# Patient Record
Sex: Female | Born: 1935 | Race: White | Hispanic: No | Marital: Married | Smoking: Former smoker
Health system: Southern US, Community
[De-identification: ages and names within clinical notes are randomized; demographics above are authoritative.]

## PROBLEM LIST (undated history)

## (undated) DIAGNOSIS — N183 Chronic kidney disease, stage 3 unspecified: Secondary | ICD-10-CM

## (undated) DIAGNOSIS — I495 Sick sinus syndrome: Secondary | ICD-10-CM

## (undated) DIAGNOSIS — I10 Essential (primary) hypertension: Secondary | ICD-10-CM

## (undated) DIAGNOSIS — E785 Hyperlipidemia, unspecified: Secondary | ICD-10-CM

## (undated) DIAGNOSIS — I5042 Chronic combined systolic (congestive) and diastolic (congestive) heart failure: Secondary | ICD-10-CM

## (undated) DIAGNOSIS — E119 Type 2 diabetes mellitus without complications: Secondary | ICD-10-CM

## (undated) DIAGNOSIS — I480 Paroxysmal atrial fibrillation: Secondary | ICD-10-CM

## (undated) HISTORY — DX: Essential (primary) hypertension: I10

## (undated) HISTORY — DX: Type 2 diabetes mellitus without complications: E11.9

## (undated) HISTORY — PX: CATARACT EXTRACTION: SUR2

## (undated) HISTORY — PX: EYE SURGERY: SHX253

## (undated) HISTORY — DX: Chronic kidney disease, stage 3 (moderate): N18.3

## (undated) HISTORY — DX: Chronic kidney disease, stage 3 unspecified: N18.30

## (undated) HISTORY — DX: Hyperlipidemia, unspecified: E78.5

## (undated) HISTORY — DX: Chronic combined systolic (congestive) and diastolic (congestive) heart failure: I50.42

## (undated) HISTORY — DX: Sick sinus syndrome: I49.5

## (undated) HISTORY — DX: Paroxysmal atrial fibrillation: I48.0

---

## 2003-05-10 ENCOUNTER — Encounter: Admission: RE | Admit: 2003-05-10 | Discharge: 2003-08-08 | Payer: Self-pay | Admitting: Emergency Medicine

## 2011-11-03 ENCOUNTER — Ambulatory Visit (INDEPENDENT_AMBULATORY_CARE_PROVIDER_SITE_OTHER): Payer: Medicare Other | Admitting: Family Medicine

## 2011-11-03 DIAGNOSIS — E119 Type 2 diabetes mellitus without complications: Secondary | ICD-10-CM

## 2011-11-03 DIAGNOSIS — I10 Essential (primary) hypertension: Secondary | ICD-10-CM

## 2012-02-16 ENCOUNTER — Encounter: Payer: Self-pay | Admitting: Family Medicine

## 2012-02-16 ENCOUNTER — Ambulatory Visit (INDEPENDENT_AMBULATORY_CARE_PROVIDER_SITE_OTHER): Payer: Medicare Other | Admitting: Family Medicine

## 2012-02-16 VITALS — BP 165/71 | HR 66 | Temp 97.8°F | Resp 16 | Ht 67.5 in | Wt 149.0 lb

## 2012-02-16 DIAGNOSIS — E785 Hyperlipidemia, unspecified: Secondary | ICD-10-CM

## 2012-02-16 DIAGNOSIS — E119 Type 2 diabetes mellitus without complications: Secondary | ICD-10-CM

## 2012-02-16 DIAGNOSIS — I10 Essential (primary) hypertension: Secondary | ICD-10-CM | POA: Insufficient documentation

## 2012-02-16 LAB — COMPREHENSIVE METABOLIC PANEL
ALT: 10 U/L (ref 0–35)
AST: 14 U/L (ref 0–37)
BUN: 23 mg/dL (ref 6–23)
Calcium: 10 mg/dL (ref 8.4–10.5)
Creat: 1.09 mg/dL (ref 0.50–1.10)
Total Bilirubin: 0.8 mg/dL (ref 0.3–1.2)

## 2012-02-16 LAB — LIPID PANEL
Cholesterol: 174 mg/dL (ref 0–200)
HDL: 51 mg/dL (ref >39)
Total CHOL/HDL Ratio: 3.4 Ratio
VLDL: 28 mg/dL (ref 0–40)

## 2012-02-16 LAB — TSH: TSH: 1.516 u[IU]/mL (ref 0.350–4.500)

## 2012-02-16 MED ORDER — GLIMEPIRIDE 2 MG PO TABS: 2.0000 mg | ORAL_TABLET | Freq: Every day | ORAL | Status: DC

## 2012-02-16 MED ORDER — CARVEDILOL 3.125 MG PO TABS: ORAL_TABLET | ORAL | Status: DC

## 2012-02-16 MED ORDER — PRAVASTATIN SODIUM 20 MG PO TABS: 20.0000 mg | ORAL_TABLET | Freq: Every day | ORAL | Status: DC

## 2012-02-16 MED ORDER — METFORMIN HCL ER 500 MG PO TB24: ORAL_TABLET | ORAL | Status: DC

## 2012-02-16 MED ORDER — LISINOPRIL-HYDROCHLOROTHIAZIDE 10-12.5 MG PO TABS: ORAL_TABLET | ORAL | Status: DC

## 2012-02-16 NOTE — Progress Notes (Signed)
Addended by: Dois Davenport on: 02/16/2012 02:00 PM   Modules accepted: Orders

## 2012-02-16 NOTE — Progress Notes (Addendum)
  Subjective:    Patient ID: Kelly Neal, female    DOB: 1928/10/06, 78 y.o.   MRN: [id number]  HPI  Patient present for routine follow up of her multiple medical problems.  DM- fasting blood sugars in the 100 range         Opthalmology care of diabetic retinopathy; has been seen at [facility] however recently switched               practices and does not know name of new physician           Denies LE paresthesias or ulceration          Last dental visit within last 6 months  HTN- compliant with mediations without side effects; normal BP readings when patient checks at local           pharmacies  Dyslipidemia- compliant with medications without side effects  SH/ Cares for disabled son Review of Systems     Objective:   Physical Exam  Constitutional: She appears well-developed.  HENT:  Head: Normocephalic and atraumatic.  Right Ear: External ear normal.  Left Ear: External ear normal.  Nose: Mucosal edema present.  Mouth/Throat: Oropharynx is clear and moist.  Eyes: EOM are normal. Pupils are equal, round, and reactive to light.  Neck: Carotid bruit is not present. No thyromegaly present.  Cardiovascular: Normal rate, regular rhythm, S1 normal, S2 normal, normal heart sounds and normal pulses.   Pulmonary/Chest: Effort normal.  Abdominal: Soft. Normal appearance and bowel sounds are normal. There is no hepatosplenomegaly.  Neurological: She has normal strength. No sensory deficit.  Reflex Scores:      Patellar reflexes are 2+ on the right side and 2+ on the left side. Skin: Skin is warm.          Assessment & Plan:   1. Hyperlipidemia  Comprehensive metabolic panel, Lipid panel, pravastatin (PRAVACHOL) 20 MG tablet, glimepiride (AMARYL) 2 MG tablet  2. Hypertension; normal BP readings at home carvedilol (COREG) 3.125 MG tablet, lisinopril-hydrochlorothiazide (PRINZIDE,ZESTORETIC) 10-12.5 MG per tablet  3. DM type 2 (diabetes mellitus, type 2)  Microalbumin, urine, POCT  glycosylated hemoglobin (Hb A1C), TSH, metFORMIN (GLUCOPHAGE XR) 500 MG 24 hr tablet   Anticipatory guidance Follow up 3 months

## 2012-02-22 NOTE — Progress Notes (Signed)
Addended by: Johnnette Litter on: 02/22/2012 12:38 PM   Modules accepted: Orders

## 2012-04-22 ENCOUNTER — Telehealth: Payer: Self-pay

## 2012-04-22 NOTE — Telephone Encounter (Signed)
Wants to speak to Dr. Hal Hope before she leaves.  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

## 2012-04-24 NOTE — Telephone Encounter (Signed)
Returned patient call and referred patient to Dr. Audria Nine for care of her medical issues.

## 2012-12-24 ENCOUNTER — Telehealth: Payer: Self-pay

## 2012-12-24 DIAGNOSIS — E119 Type 2 diabetes mellitus without complications: Secondary | ICD-10-CM

## 2012-12-24 MED ORDER — METFORMIN HCL ER 500 MG PO TB24: ORAL_TABLET | ORAL | Status: DC

## 2012-12-24 NOTE — Telephone Encounter (Signed)
Notified pt that RF was sent in. 

## 2012-12-24 NOTE — Telephone Encounter (Signed)
Called patient to advise this is done/ line busy

## 2012-12-24 NOTE — Telephone Encounter (Signed)
Pt called and made an appt. On 2/20 w/ dr Audria Nine but is in need of a refill of metformin until then. She uses [facility].   Pt's # [id number]

## 2013-01-06 ENCOUNTER — Encounter: Payer: Self-pay | Admitting: Family Medicine

## 2013-01-06 ENCOUNTER — Ambulatory Visit (INDEPENDENT_AMBULATORY_CARE_PROVIDER_SITE_OTHER): Payer: Medicare Other | Admitting: Family Medicine

## 2013-01-06 VITALS — BP 177/76 | HR 63 | Temp 97.7°F | Resp 16 | Ht 67.5 in | Wt 148.0 lb

## 2013-01-06 DIAGNOSIS — E785 Hyperlipidemia, unspecified: Secondary | ICD-10-CM

## 2013-01-06 DIAGNOSIS — E119 Type 2 diabetes mellitus without complications: Secondary | ICD-10-CM

## 2013-01-06 DIAGNOSIS — Z760 Encounter for issue of repeat prescription: Secondary | ICD-10-CM

## 2013-01-06 DIAGNOSIS — I10 Essential (primary) hypertension: Secondary | ICD-10-CM

## 2013-01-06 LAB — ALT: ALT: 12 U/L (ref 0–35)

## 2013-01-06 LAB — LDL CHOLESTEROL, DIRECT: Direct LDL: 115 mg/dL — ABNORMAL HIGH

## 2013-01-06 LAB — BASIC METABOLIC PANEL
BUN: 27 mg/dL — ABNORMAL HIGH (ref 6–23)
Calcium: 9.5 mg/dL (ref 8.4–10.5)
Chloride: 104 mEq/L (ref 96–112)
Creat: 0.99 mg/dL (ref 0.50–1.10)

## 2013-01-06 MED ORDER — CARVEDILOL 3.125 MG PO TABS: ORAL_TABLET | ORAL | Status: DC

## 2013-01-06 MED ORDER — PRAVASTATIN SODIUM 20 MG PO TABS: 20.0000 mg | ORAL_TABLET | Freq: Every day | ORAL | Status: DC

## 2013-01-06 MED ORDER — METFORMIN HCL ER 500 MG PO TB24: ORAL_TABLET | ORAL | Status: DC

## 2013-01-06 MED ORDER — LISINOPRIL-HYDROCHLOROTHIAZIDE 10-12.5 MG PO TABS: ORAL_TABLET | ORAL | Status: DC

## 2013-01-06 MED ORDER — GLIMEPIRIDE 2 MG PO TABS: 2.0000 mg | ORAL_TABLET | Freq: Every day | ORAL | Status: DC

## 2013-01-06 NOTE — Patient Instructions (Signed)
You are doing a great job of staying healthy; continue to maintain good nutrition , stay active and take your medications. You can schedule a complete physical around October 2014.

## 2013-01-07 NOTE — Progress Notes (Signed)
S:  This pleasant 79 y.o. Cauc female has Type II DM and HTN, both well controlled on current medications. Not glucometer readings brought to this visit. Pt feels she is stable and denies symptoms c/w hypoglycemia. She maintains healthy nutrition and activity appropriate for her age.  She reports previous problem w/ dizziness that resolved after medication dose reduction ( Metformin and Carvedilol). She has not musculoskeletal problems w/ statin medication. Lipid results last checked in April 2013; these were reviewed w/ pt.  ROS: As per HPI; negative for fatigue, fever/ chills, weight change, CP or tightness, palpitations, SOB or DOE, cough, GI upset, HA, dizziness, numbness, weakness or syncope.  O:  Filed Vitals:   01/06/13 1137  BP: 177/76  Pulse: 63  Temp: 97.7 F (36.5 C)  Resp: 16   GEN: In NAD; WN,WD. Appears younger than stated age. HENT: [place]/AT; EOMI w/ clear conj/ sclerae. Oroph clear and moist. NECK: No LAN or TMG. COR: RRR. LUNGS: Normal resp rate and effort. SKIN: W&D.  MS: MAEs; no c/c/e. NEURO: A&O x 3; CNs intact. Nonfocal.  A1C= 7.3%   A/P: DM type 2 (diabetes mellitus, type 2) - Plan: POCT glycosylated hemoglobin (Hb A1C), Basic metabolic panel, metFORMIN (GLUCOPHAGE XR) 500 MG 24 hr tablet  Hyperlipidemia - Plan: ALT, Basic metabolic panel, pravastatin (PRAVACHOL) 20 MG tablet, glimepiride (AMARYL) 2 MG tablet, LDL Cholesterol, Direct  Hypertension - Plan: ALT, lisinopril-hydrochlorothiazide (PRINZIDE,ZESTORETIC) 10-12.5 MG per tablet, carvedilol (COREG) 3.125 MG tablet  Meds ordered this encounter  Medications  . metFORMIN (GLUCOPHAGE XR) 500 MG 24 hr tablet    Sig: 1 1/2 tablets daily    Dispense:  150 tablet    Refill:  2  . pravastatin (PRAVACHOL) 20 MG tablet    Sig: Take 1 tablet (20 mg total) by mouth daily.    Dispense:  90 tablet    Refill:  2  . lisinopril-hydrochlorothiazide (PRINZIDE,ZESTORETIC) 10-12.5 MG per tablet    Sig: 1/2 table twice  daily    Dispense:  90 tablet    Refill:  2  . glimepiride (AMARYL) 2 MG tablet    Sig: Take 1 tablet (2 mg total) by mouth daily before breakfast.    Dispense:  90 tablet    Refill:  2  . carvedilol (COREG) 3.125 MG tablet    Sig: 1/2 tablet twice daily    Dispense:  90 tablet    Refill:  2

## 2013-01-08 NOTE — Progress Notes (Signed)
Quick Note:  Please contact pt and advise that the following labs are abnormal... Blood sugar was a little above normal; no medication changes are indicated at this time. We only checked LDL ("BAD") cholesterol and it is a little above normal.  Overall, I think your labs look good (all things considered)!  Copy to pt. ______

## 2013-04-19 ENCOUNTER — Emergency Department (HOSPITAL_COMMUNITY): Payer: Medicare Other

## 2013-04-19 ENCOUNTER — Encounter (HOSPITAL_COMMUNITY): Payer: Self-pay | Admitting: Emergency Medicine

## 2013-04-19 ENCOUNTER — Ambulatory Visit (INDEPENDENT_AMBULATORY_CARE_PROVIDER_SITE_OTHER): Payer: Medicare Other | Admitting: Family Medicine

## 2013-04-19 ENCOUNTER — Inpatient Hospital Stay (HOSPITAL_COMMUNITY)
Admission: EM | Admit: 2013-04-19 | Discharge: 2013-04-21 | DRG: 244 | Disposition: A | Discharge status: Home / Self Care | Payer: Medicare Other | Attending: Cardiovascular Disease | Admitting: Cardiovascular Disease

## 2013-04-19 VITALS — BP 150/78 | HR 38 | Temp 98.7°F | Resp 16 | Ht 68.0 in | Wt 147.0 lb

## 2013-04-19 DIAGNOSIS — I10 Essential (primary) hypertension: Secondary | ICD-10-CM | POA: Diagnosis present

## 2013-04-19 DIAGNOSIS — R001 Bradycardia, unspecified: Secondary | ICD-10-CM

## 2013-04-19 DIAGNOSIS — E119 Type 2 diabetes mellitus without complications: Secondary | ICD-10-CM | POA: Diagnosis present

## 2013-04-19 DIAGNOSIS — I442 Atrioventricular block, complete: Principal | ICD-10-CM | POA: Diagnosis present

## 2013-04-19 DIAGNOSIS — Z87891 Personal history of nicotine dependence: Secondary | ICD-10-CM

## 2013-04-19 DIAGNOSIS — I498 Other specified cardiac arrhythmias: Secondary | ICD-10-CM

## 2013-04-19 DIAGNOSIS — R55 Syncope and collapse: Secondary | ICD-10-CM

## 2013-04-19 DIAGNOSIS — Z950 Presence of cardiac pacemaker: Secondary | ICD-10-CM | POA: Diagnosis not present

## 2013-04-19 DIAGNOSIS — I441 Atrioventricular block, second degree: Secondary | ICD-10-CM | POA: Diagnosis present

## 2013-04-19 DIAGNOSIS — E785 Hyperlipidemia, unspecified: Secondary | ICD-10-CM | POA: Diagnosis present

## 2013-04-19 DIAGNOSIS — R229 Localized swelling, mass and lump, unspecified: Secondary | ICD-10-CM

## 2013-04-19 LAB — CBC
HCT: 32.2 % — ABNORMAL LOW (ref 36.0–46.0)
Hemoglobin: 11.2 g/dL — ABNORMAL LOW (ref 12.0–15.0)
MCHC: 35.1 g/dL (ref 30.0–36.0)
MCV: 89 fL (ref 78.0–100.0)
Platelets: 223 10*3/uL (ref 150–400)
RBC: 3.62 MIL/uL — ABNORMAL LOW (ref 3.87–5.11)
RDW: 12.6 % (ref 11.5–15.5)
RDW: 12.7 % (ref 11.5–15.5)
WBC: 8.9 10*3/uL (ref 4.0–10.5)

## 2013-04-19 LAB — URINE MICROSCOPIC-ADD ON

## 2013-04-19 LAB — URINALYSIS, ROUTINE W REFLEX MICROSCOPIC
Nitrite: NEGATIVE
Protein, ur: >300 mg/dL — AB
Specific Gravity, Urine: 1.013 (ref 1.005–1.030)
Urobilinogen, UA: 0.2 mg/dL (ref 0.0–1.0)

## 2013-04-19 LAB — CREATININE, SERUM
GFR calc Af Amer: 65 mL/min — ABNORMAL LOW (ref >90)
GFR calc non Af Amer: 56 mL/min — ABNORMAL LOW (ref >90)

## 2013-04-19 LAB — BASIC METABOLIC PANEL
GFR calc Af Amer: 61 mL/min — ABNORMAL LOW (ref >90)
GFR calc non Af Amer: 53 mL/min — ABNORMAL LOW (ref >90)
Potassium: 3.9 mEq/L (ref 3.5–5.1)
Sodium: 141 mEq/L (ref 135–145)

## 2013-04-19 MED ORDER — GLIMEPIRIDE 2 MG PO TABS
2.0000 mg | ORAL_TABLET | Freq: Every day | ORAL | Status: DC
  Administered 2013-04-21: 09:00:00 2 mg via ORAL
  Filled 2013-04-19 (×3): qty 1

## 2013-04-19 MED ORDER — ACETAMINOPHEN 325 MG PO TABS: 650.0000 mg | ORAL_TABLET | Freq: Four times a day (QID) | ORAL | Status: DC | PRN

## 2013-04-19 MED ORDER — HYDROCHLOROTHIAZIDE 12.5 MG PO CAPS
12.5000 mg | ORAL_CAPSULE | Freq: Every day | ORAL | Status: DC
  Administered 2013-04-20 – 2013-04-21 (×2): 12.5 mg via ORAL
  Filled 2013-04-19 (×2): qty 1

## 2013-04-19 MED ORDER — LISINOPRIL 10 MG PO TABS
10.0000 mg | ORAL_TABLET | Freq: Every day | ORAL | Status: DC
  Administered 2013-04-20 – 2013-04-21 (×2): 10 mg via ORAL
  Filled 2013-04-19 (×2): qty 1

## 2013-04-19 MED ORDER — LISINOPRIL-HYDROCHLOROTHIAZIDE 10-12.5 MG PO TABS: 1.0000 | ORAL_TABLET | Freq: Every day | ORAL | Status: DC

## 2013-04-19 MED ORDER — ALUM & MAG HYDROXIDE-SIMETH 200-200-20 MG/5ML PO SUSP: 30.0000 mL | Freq: Four times a day (QID) | ORAL | Status: DC | PRN

## 2013-04-19 MED ORDER — CEFAZOLIN SODIUM-DEXTROSE 2-3 GM-% IV SOLR
2.0000 g | INTRAVENOUS | Status: DC
  Filled 2013-04-19: qty 50

## 2013-04-19 MED ORDER — HEPARIN SODIUM (PORCINE) 5000 UNIT/ML IJ SOLN
5000.0000 [IU] | Freq: Three times a day (TID) | INTRAMUSCULAR | Status: AC
  Administered 2013-04-19: 5000 [IU] via SUBCUTANEOUS
  Filled 2013-04-19: qty 1

## 2013-04-19 MED ORDER — POLYETHYLENE GLYCOL 3350 17 G PO PACK
17.0000 g | PACK | Freq: Every day | ORAL | Status: DC | PRN
  Filled 2013-04-19: qty 1

## 2013-04-19 MED ORDER — ACETAMINOPHEN 650 MG RE SUPP: 650.0000 mg | Freq: Four times a day (QID) | RECTAL | Status: DC | PRN

## 2013-04-19 MED ORDER — CHLORHEXIDINE GLUCONATE 4 % EX LIQD
60.0000 mL | Freq: Once | CUTANEOUS | Status: AC
  Administered 2013-04-19: 4 via TOPICAL
  Filled 2013-04-19: qty 120

## 2013-04-19 MED ORDER — SODIUM CHLORIDE 0.9 % IR SOLN
80.0000 mg | Status: DC
  Filled 2013-04-19: qty 2

## 2013-04-19 MED ORDER — CHLORHEXIDINE GLUCONATE 4 % EX LIQD
60.0000 mL | Freq: Once | CUTANEOUS | Status: AC
  Administered 2013-04-20: 4 via TOPICAL

## 2013-04-19 MED ORDER — SODIUM CHLORIDE 0.9 % IJ SOLN
3.0000 mL | Freq: Two times a day (BID) | INTRAMUSCULAR | Status: DC
  Administered 2013-04-19 – 2013-04-21 (×3): 3 mL via INTRAVENOUS

## 2013-04-19 MED ORDER — SODIUM CHLORIDE 0.9 % IV SOLN: INTRAVENOUS | Status: DC

## 2013-04-19 NOTE — Progress Notes (Signed)
[facility] [address] [place] [address] [phone]  Date:  04/19/2013   Name:  Kelly Neal   DOB:  [birth year]   MRN:  [id number]  PCP:  No primary provider on file.    Chief Complaint: Dizziness x 5 days   History of Present Illness:  Kelly Neal is a 79 y.o. very pleasant female patient who presents with the following:  Here today with dizziness for the last 5 days or so.  She did have a syncopal episode about 5 days ago- her children observed this episode and were not sure if she might have had a seizure. She was sitting down and just lost consciousness- she did not get hurt and woke up by herself.  She then had a pre- syncopal episode yesterday and again here in clinic.  She has not noted any CP or SOB.   She is not aware of any heart problems or any prior episodes of bradycardia.    History of DM.  Her A1c was 7.3 in February of this year.    Patient Active Problem List   Diagnosis Date Noted  . Hyperlipidemia   . Hypertension   . DM type 2 (diabetes mellitus, type 2)     Past Medical History  Diagnosis Date  . Hyperlipidemia   . Hypertension   . DM type 2 (diabetes mellitus, type 2)     Past Surgical History  Procedure Laterality Date  . Cataract extraction      History  Substance Use Topics  . Smoking status: Former Smoker -- 2 years    Types: Cigarettes    Quit date: 11/17/1950  . Smokeless tobacco: Not on file  . Alcohol Use: No    Family History  Problem Relation Age of Onset  . Hypertension Mother   . Heart attack Father     No Known Allergies  Medication list has been reviewed and updated.  Current Outpatient Prescriptions on File Prior to Visit  Medication Sig Dispense Refill  . aspirin 81 MG tablet Take 81 mg by mouth daily.      . carvedilol (COREG) 3.125 MG tablet 1/2 tablet twice daily  90 tablet  2  . glimepiride (AMARYL) 2 MG tablet Take 1 tablet (2 mg total) by mouth daily before breakfast.  90  tablet  2  . lisinopril-hydrochlorothiazide (PRINZIDE,ZESTORETIC) 10-12.5 MG per tablet 1/2 table twice daily  90 tablet  2  . metFORMIN (GLUCOPHAGE XR) 500 MG 24 hr tablet 1 1/2 tablets daily  150 tablet  2  . pravastatin (PRAVACHOL) 20 MG tablet Take 1 tablet (20 mg total) by mouth daily.  90 tablet  2   No current facility-administered medications on file prior to visit.    Review of Systems:  As per HPI- otherwise negative.   Physical Examination: Filed Vitals:   04/19/13 1526  BP: 150/78  Pulse: 38  Temp: 98.7 F (37.1 C)  Resp: 16   Filed Vitals:   04/19/13 1526  Height: 5\' 8"  (1.727 m)  Weight: 147 lb (66.679 kg)   Body mass index is 22.36 kg/(m^2). Ideal Body Weight: Weight in (lb) to have BMI = 25: 164.1  GEN: WDWN, NAD, Non-toxic, A & O x 3 Had a pre- syncopal episode and needed to lie down while in triage, noted to be quite bradycardic HEENT: Atraumatic, Normocephalic. Neck supple. No masses, No LAD. Ears and Nose: No external deformity. CV: very bradycardic-  No M/G/R.  No JVD. No thrill. No extra heart sounds. PULM: CTA B, no wheezes, crackles, rhonchi. No retractions. No resp. distress. No accessory muscle use. EXTR: No c/c/e NEURO Normal gait.  PSYCH: Normally interactive. Conversant. Scared and slightly tearful  EKG: sinus bradycardia and arrythmia.  Appears that she is conducting every other P wave.  Assessment and Plan: Bradycardia  Bradycardia with syncope- rate 40 or less.  Suspect sick sinus syndrome.  Will transfer to the ED via EMS for further evaluation.   Called her daughter and let her know that she is going to [facility]- she will meet her there.   IV placed left AC for access.  Of note- she is on coreg.  Signed Abbe Amsterdam, MD

## 2013-04-19 NOTE — ED Provider Notes (Signed)
History     CSN: [id number]  Arrival date & time 04/19/13  1639   First MD Initiated Contact with Patient 04/19/13 1651      Chief Complaint  Patient presents with  . Bradycardia  . Dizziness    (Consider location/radiation/quality/duration/timing/severity/associated sxs/prior treatment) HPI Comments: Kelly Neal is a 79 y.o. Female sent for evaluation of bradycardia. She went to an urgent care today because she is dizziness for one week, on and off. She is also had 4 episodes of decreased responsiveness with shaking, described a family member with her, as a seizure. The shaking episodes have lasted between 15 and 40 seconds. There is no described fall, postictal state or injury associated with the spells. She denies shortness of breath, chest pain, abdominal pain. She is single episode of vomiting, associated with one of the shaking spells. She did not eat today, because she was not hungry. She has been taking her medications, as usual. She was seen at the urgent care today, and sent here by EMS for evaluation. She did not receive treatment in the EMS unit. There are no known modifying factors.  The history is provided by the patient.    Past Medical History  Diagnosis Date  . Hyperlipidemia   . Hypertension   . DM type 2 (diabetes mellitus, type 2)     Past Surgical History  Procedure Laterality Date  . Cataract extraction      Family History  Problem Relation Age of Onset  . Hypertension Mother   . Heart attack Father     History  Substance Use Topics  . Smoking status: Former Smoker -- 2 years    Types: Cigarettes    Quit date: 11/17/1950  . Smokeless tobacco: Not on file  . Alcohol Use: No    OB History   Grav Para Term Preterm Abortions TAB SAB Ect Mult Living                  Review of Systems  All other systems reviewed and are negative.    Allergies  Review of patient's allergies indicates no known allergies.  Home Medications   Current  Outpatient Rx  Name  Route  Sig  Dispense  Refill  . carvedilol (COREG) 3.125 MG tablet   Oral   Take 1.5625 mg by mouth 2 (two) times daily with a meal.          . glimepiride (AMARYL) 2 MG tablet   Oral   Take 1 tablet (2 mg total) by mouth daily before breakfast.   90 tablet   2   . lisinopril-hydrochlorothiazide (PRINZIDE,ZESTORETIC) 10-12.5 MG per tablet   Oral   Take 0.5 tablets by mouth 2 (two) times daily.         . metFORMIN (GLUCOPHAGE-XR) 500 MG 24 hr tablet   Oral   Take 750 mg by mouth daily with breakfast.         . pravastatin (PRAVACHOL) 20 MG tablet   Oral   Take 1 tablet (20 mg total) by mouth daily.   90 tablet   2     BP 196/43  Pulse 35  Resp 14  SpO2 97%  Physical Exam  Nursing note and vitals reviewed. Constitutional: She is oriented to person, place, and time. She appears well-developed.  Elderly, frail  HENT:  Head: Normocephalic and atraumatic.  Eyes: Conjunctivae and EOM are normal. Pupils are equal, round, and reactive to light.  Neck: Normal range of motion  and phonation normal. Neck supple.  Cardiovascular: Intact distal pulses.   Irregular bradycardia. Radial pulse coordinates with QRS complexes on heart monitor.  Pulmonary/Chest: Effort normal and breath sounds normal. She exhibits no tenderness.  Abdominal: Soft. She exhibits no distension. There is no tenderness. There is no guarding.  Musculoskeletal: Normal range of motion.  Neurological: She is alert and oriented to person, place, and time. She has normal strength. She exhibits normal muscle tone.  Skin: Skin is warm and dry.  Psychiatric: She has a normal mood and affect. Her behavior is normal. Judgment and thought content normal.    ED Course  Procedures (including critical care time)  17:20- nursing asked to place external pacer leads, in case needed for decompensation   17:30 PM-Consult complete with Dr. Loletta Specter. Patient case explained and discussed. He agrees  to admit patient for further evaluation and treatment. Call ended at 18:30    Date: 04/19/13-  16:46  Rate: 36  Rhythm: sinus bradycardia  QRS Axis: normal  PR and QT Intervals: Normal PR when conducted; Type 2 AV block, Mobitz 2  ST/T Wave abnormalities: normal  PR and QRS Conduction Disutrbances:second-degree A-V block, ( Mobitz II ), RBBB, LAFB  Narrative Interpretation:   Old EKG Reviewed: None available  Labs Reviewed  CBC - Abnormal; Notable for the following:    RBC 3.67 (*)    Hemoglobin 11.4 (*)    HCT 32.5 (*)    All other components within normal limits  BASIC METABOLIC PANEL - Abnormal; Notable for the following:    Glucose, Bld 122 (*)    BUN 27 (*)    GFR calc non Af Amer 53 (*)    GFR calc Af Amer 61 (*)    All other components within normal limits  URINALYSIS, ROUTINE W REFLEX MICROSCOPIC - Abnormal; Notable for the following:    Hgb urine dipstick SMALL (*)    Protein, ur >300 (*)    Leukocytes, UA TRACE (*)    All other components within normal limits  URINE MICROSCOPIC-ADD ON - Abnormal; Notable for the following:    Squamous Epithelial / LPF FEW (*)    Bacteria, UA FEW (*)    Casts HYALINE CASTS (*)    All other components within normal limits  URINE CULTURE  POCT I-STAT TROPONIN I   Dg Chest Port 1 View  04/19/2013   *RADIOLOGY REPORT*  Clinical Data: Bradycardia and dizziness.  PORTABLE CHEST - 1 VIEW  Comparison: None.  Findings: Heart is enlarged.  There is perihilar peribronchial thickening.  There are no focal consolidations or pleural effusions.  Transcutaneous pacing leads overlie the chest, obscuring some detail. A skin fold overlies the left lung base.  IMPRESSION:  1.  Cardiomegaly without overt edema. 2.  Bronchitic changes.   Original Report Authenticated By: Norva Pavlov, M.D.     1. Mobitz (type) II atrioventricular block   2. Syncope        Plan: Admit MDM  Bradycardia with Mobitz type II second-degree block. She is apparently  having cardiac syncope as well. She needs to be admitted for stabilization and likely cardiovascular assessment with electrophysiology and/or coronary artery disease evaluation. She remained stable in the emergency department, and did not require external pacing.   Nursing Notes Reviewed/ Care Coordinated Applicable Imaging Reviewed Interpretation of Laboratory Data incorporated into ED treatment       Flint Melter, MD 04/19/13 2001

## 2013-04-19 NOTE — ED Notes (Signed)
Nurse unable to receive report 

## 2013-04-19 NOTE — ED Notes (Signed)
Unable to give report at this time.

## 2013-04-19 NOTE — ED Notes (Signed)
Patient amb to the BR without difficulty.  Stated she did not get dizzy.  Back to bed.

## 2013-04-19 NOTE — ED Notes (Signed)
Admitting MD in to see patient. °

## 2013-04-19 NOTE — H&P (Signed)
Chief Complaint:  Syncope  HPI:  This is a 79 y.o. female with a past medical history significant for type 2 diabetes mellitus, hypertension and hyperlipidemia but without known cardiac or vascular disease. She has a long history of complaints of fatigue and recently dizziness. 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 She has not fallen or sustained injury. Not long ago one of her physicians told her to reduce her carvedilol dose to half because of complaints of dizziness. This was associated with immediate improvement. Her symptoms have been recurred.  12-lead ECG and telemetry monitoring shows persistent second-degree atrioventricular block, Mobitz type I, mostly 21 with marked bradycardia into the low 30s. 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  She denies chest pain or shortness of breath, she has complained of reduced energy.  PMHx:  Past Medical History  Diagnosis Date  . Hyperlipidemia   . Hypertension   . DM type 2 (diabetes mellitus, type 2)     Past Surgical History  Procedure Laterality Date  . Cataract extraction      FAMHx:  Family History  Problem Relation Age of Onset  . Hypertension Mother   . Heart attack Father     SOCHx:   reports that she quit smoking about 62 years ago. Her smoking use included Cigarettes. She smoked 0.00 packs per day for 2 years. She does not have any smokeless tobacco history on file. She reports that she does not drink alcohol or use illicit drugs.  ALLERGIES:  No Known Allergies  ROS: Constitutional: positive for fatigue, negative for chills, fevers, night sweats and weight loss Eyes: negative for visual disturbance Ears, nose, mouth, throat, and face: negative for  hoarseness, sore throat and voice change Respiratory: negative for cough, dyspnea on exertion, hemoptysis and wheezing Cardiovascular: positive for fatigue and irregular heart beat, negative for chest pain, exertional chest pressure/discomfort, lower extremity edema, orthopnea, palpitations and paroxysmal nocturnal dyspnea Gastrointestinal: positive for vomiting Genitourinary:negative for dysuria, frequency and hesitancy Hematologic/lymphatic: negative for bleeding, easy bruising and petechiae Musculoskeletal:negative for arthralgias and muscle weakness Neurological: negative for gait problems, paresthesia, speech problems and Focal weakness Behavioral/Psych: negative for anxiety, mood swings, sleep disturbance and tobacco use Endocrine: negative for diabetic symptoms including polydipsia, polyphagia and polyuria and temperature intolerance  HOME MEDS:  (Not in a hospital admission)  LABS/IMAGING: Results for orders placed during the hospital encounter of 04/19/13 (from the past 48 hour(s))  CBC     Status: Abnormal   Collection Time    04/19/13  5:09 PM      Result Value Range   WBC 8.9  4.0 - 10.5 K/uL   RBC 3.67 (*) 3.87 - 5.11 MIL/uL   Hemoglobin 11.4 (*) 12.0 - 15.0 g/dL   HCT 16.1 (*) 09.6 - 04.5 %   MCV 88.6  78.0 - 100.0 fL   MCH 31.1  26.0 - 34.0 pg   MCHC 35.1  30.0 - 36.0 g/dL   RDW 40.9  81.1 - 91.4 %   Platelets 223  150 - 400 K/uL  BASIC METABOLIC PANEL     Status: Abnormal   Collection Time    04/19/13  5:09 PM      Result Value Range   Sodium 141  135 - 145 mEq/L  Potassium 3.9  3.5 - 5.1 mEq/L   Chloride 107  96 - 112 mEq/L   CO2 25  19 - 32 mEq/L   Glucose, Bld 122 (*) 70 - 99 mg/dL   BUN 27 (*) 6 - 23 mg/dL   Creatinine, Ser 5.78  0.50 - 1.10 mg/dL   Calcium 9.1  8.4 - 46.9 mg/dL   GFR calc non Af Amer 53 (*) >90 mL/min   GFR calc Af Amer 61 (*) >90 mL/min   Comment:            The eGFR has been calculated     using the CKD EPI equation.     This  calculation has not been     validated in all clinical     situations.     eGFR's persistently     <90 mL/min signify     possible Chronic Kidney Disease.  URINALYSIS, ROUTINE W REFLEX MICROSCOPIC     Status: Abnormal   Collection Time    04/19/13  6:00 PM      Result Value Range   Color, Urine YELLOW  YELLOW   APPearance CLEAR  CLEAR   Specific Gravity, Urine 1.013  1.005 - 1.030   pH 6.5  5.0 - 8.0   Glucose, UA NEGATIVE  NEGATIVE mg/dL   Hgb urine dipstick SMALL (*) NEGATIVE   Bilirubin Urine NEGATIVE  NEGATIVE   Ketones, ur NEGATIVE  NEGATIVE mg/dL   Protein, ur >629 (*) NEGATIVE mg/dL   Urobilinogen, UA 0.2  0.0 - 1.0 mg/dL   Nitrite NEGATIVE  NEGATIVE   Leukocytes, UA TRACE (*) NEGATIVE  URINE MICROSCOPIC-ADD ON     Status: Abnormal   Collection Time    04/19/13  6:00 PM      Result Value Range   Squamous Epithelial / LPF FEW (*) RARE   WBC, UA 3-6  <3 WBC/hpf   RBC / HPF 3-6  <3 RBC/hpf   Bacteria, UA FEW (*) RARE   Casts HYALINE CASTS (*) NEGATIVE   Urine-Other MUCOUS PRESENT     Comment: AMORPHOUS URATES/PHOSPHATES  POCT I-STAT TROPONIN I     Status: None   Collection Time    04/19/13  6:54 PM      Result Value Range   Troponin i, poc 0.01  0.00 - 0.08 ng/mL   Comment 3            Comment: Due to the release kinetics of cTnI,     a negative result within the first hours     of the onset of symptoms does not rule out     myocardial infarction with certainty.     If myocardial infarction is still suspected,     repeat the test at appropriate intervals.   Dg Chest Port 1 View  04/19/2013   *RADIOLOGY REPORT*  Clinical Data: Bradycardia and dizziness.  PORTABLE CHEST - 1 VIEW  Comparison: None.  Findings: Heart is enlarged.  There is perihilar peribronchial thickening.  There are no focal consolidations or pleural effusions.  Transcutaneous pacing leads overlie the chest, obscuring some detail. A skin fold overlies the left lung base.  IMPRESSION:  1.  Cardiomegaly  without overt edema. 2.  Bronchitic changes.   Original Report Authenticated By: Norva Pavlov, M.D.    VITALS: Blood pressure 196/43, pulse 35, resp. rate 14, SpO2 97.00%.  EXAM: General appearance: alert, cooperative and no distress Neck: no adenopathy, no carotid bruit, no JVD, supple, symmetrical, trachea  midline, thyroid not enlarged, symmetric, no tenderness/mass/nodules and Intermittent cannon A waves Lungs: clear to auscultation bilaterally Heart: regularly irregular rhythm, S1, S2 normal, no S3 or S4, no click, no rub and Very faint early peaking aortic ejection murmur Abdomen: soft, non-tender; bowel sounds normal; no masses,  no organomegaly Extremities: extremities normal, atraumatic, no cyanosis or edema Pulses: 2+ and symmetric Skin: Skin color, texture, turgor normal. No rashes or lesions Neurologic: Alert and oriented X 3, normal strength and tone. Normal symmetric reflexes. Normal coordination and gait  IMPRESSION: Mrs. Blassingame has had syncope and repeated Adams-Stokes seizures related to profound bradycardia in the second of second degree atrioventricular block. There is clear evidence of a Mobitz type I block on her current electrocardiographic tracings, but the clinical history and the presence of extensive intraventricular conduction delay supports the presence of infra-hisian disease. She takes a minute amount of beta blocker and it is unlikely that this continuing this medication resolve the problem. She has no evidence of electrolyte abnormalities or clinical signs of hypothyroidism that could explain the problem. In addition she describes long-term symptoms suggestive of symptomatic bradycardia.  I think she will inevitably require a dual-chamber permanent pacemaker. Her daughter was present in the room as we went over the indications for the device, the technical details of device implantation, the risks benefits and possible complications of pacemaker implantation as  well as long-term followup. They asked several pertinent questions and they are agreeable to going ahead with implantation.  Currently Mrs. Baglio is asymptomatic with a heart rate of approximately 33 beats per minute. I think as long as she is supine it is unlikely to short experience full-blown syncope. I would like to avoid an unnecessary temporary pacemaker wire, but did tell the patient and her family that this may be needed emergently in the middle of the night. Transcutaneous pacemaker pads were placed.  As I will be unable to perform this procedure tomorrow I have asked Dr. Sharrell Ku to evaluate the patient and implant the pacemaker tomorrow morning. He very kindly agreed to do this.  At this time she has fairly significant systolic hypertension with an extremely broad pulse pressure secondary to the marked bradycardia. I suspect that this will improve following restoration of formal heart rate. Carvedilol will be held tonight. An echocardiogram is ordered for the morning but I doubt that there will be findings no change the decision regarding device implantation.   Thurmon Fair, MD  04/19/2013, 8:05 PM

## 2013-04-19 NOTE — ED Notes (Signed)
Pt placed on zoll pads per dr. Effie Shy. Monitor set to stand by. Pt alert and oriented at this time. Denies any pain. Denies dizziness. Will continue to monitor.

## 2013-04-19 NOTE — ED Notes (Signed)
Re-paged Dr. Joslyn Devon to [phone]

## 2013-04-19 NOTE — ED Notes (Signed)
Per EMS pt came from UC where she was being evaluated for dizziness and felt like her BP was elevated. Upon arrival to UC they found her HR to be in the 40s. Pt denies dizziness at this time, states she is only dizzy when she stands.

## 2013-04-20 ENCOUNTER — Encounter (HOSPITAL_COMMUNITY)
Admission: EM | Disposition: A | Discharge status: Home / Self Care | Payer: Self-pay | Source: Home / Self Care | Attending: Cardiovascular Disease

## 2013-04-20 DIAGNOSIS — I441 Atrioventricular block, second degree: Secondary | ICD-10-CM

## 2013-04-20 DIAGNOSIS — I517 Cardiomegaly: Secondary | ICD-10-CM

## 2013-04-20 DIAGNOSIS — I442 Atrioventricular block, complete: Secondary | ICD-10-CM

## 2013-04-20 HISTORY — PX: PERMANENT PACEMAKER INSERTION: SHX5480

## 2013-04-20 LAB — MRSA PCR SCREENING: MRSA by PCR: NEGATIVE

## 2013-04-20 LAB — GLUCOSE, CAPILLARY
Glucose-Capillary: 133 mg/dL — ABNORMAL HIGH (ref 70–99)
Glucose-Capillary: 101 mg/dL — ABNORMAL HIGH (ref 70–99)
Glucose-Capillary: 147 mg/dL — ABNORMAL HIGH (ref 70–99)

## 2013-04-20 LAB — TSH: TSH: 1.135 u[IU]/mL (ref 0.350–4.500)

## 2013-04-20 SURGERY — PERMANENT PACEMAKER INSERTION
Anesthesia: LOCAL

## 2013-04-20 MED ORDER — MIDAZOLAM HCL 5 MG/5ML IJ SOLN
INTRAMUSCULAR | Status: AC
  Filled 2013-04-20: qty 5

## 2013-04-20 MED ORDER — CEFAZOLIN SODIUM 1-5 GM-% IV SOLN
INTRAVENOUS | Status: AC
  Filled 2013-04-20: qty 100

## 2013-04-20 MED ORDER — HYDRALAZINE HCL 20 MG/ML IJ SOLN
10.0000 mg | INTRAMUSCULAR | Status: DC | PRN
  Administered 2013-04-20 – 2013-04-21 (×2): 10 mg via INTRAVENOUS
  Filled 2013-04-20 (×2): qty 1

## 2013-04-20 MED ORDER — CEFAZOLIN SODIUM-DEXTROSE 2-3 GM-% IV SOLR
2.0000 g | Freq: Four times a day (QID) | INTRAVENOUS | Status: AC
  Administered 2013-04-20 – 2013-04-21 (×3): 2 g via INTRAVENOUS
  Filled 2013-04-20 (×3): qty 50

## 2013-04-20 MED ORDER — YOU HAVE A PACEMAKER BOOK
Freq: Once | Status: AC
  Administered 2013-04-20: 17:00:00
  Filled 2013-04-20: qty 1

## 2013-04-20 MED ORDER — FENTANYL CITRATE 0.05 MG/ML IJ SOLN
INTRAMUSCULAR | Status: AC
  Filled 2013-04-20: qty 2

## 2013-04-20 MED ORDER — LIDOCAINE HCL (PF) 1 % IJ SOLN
INTRAMUSCULAR | Status: AC
  Filled 2013-04-20: qty 60

## 2013-04-20 MED ORDER — LORAZEPAM 0.5 MG PO TABS
0.5000 mg | ORAL_TABLET | Freq: Once | ORAL | Status: AC
  Administered 2013-04-20: 0.5 mg via ORAL
  Filled 2013-04-20: qty 1

## 2013-04-20 MED ORDER — HYDRALAZINE HCL 20 MG/ML IJ SOLN
10.0000 mg | Freq: Once | INTRAMUSCULAR | Status: AC
  Administered 2013-04-20: 15:00:00 10 mg via INTRAVENOUS

## 2013-04-20 MED ORDER — HEPARIN (PORCINE) IN NACL 2-0.9 UNIT/ML-% IJ SOLN
INTRAMUSCULAR | Status: AC
  Filled 2013-04-20: qty 500

## 2013-04-20 MED ORDER — ACETAMINOPHEN 325 MG PO TABS
325.0000 mg | ORAL_TABLET | ORAL | Status: DC | PRN
  Administered 2013-04-20 – 2013-04-21 (×3): 650 mg via ORAL
  Filled 2013-04-20 (×3): qty 2

## 2013-04-20 MED ORDER — ONDANSETRON HCL 4 MG/2ML IJ SOLN
4.0000 mg | Freq: Four times a day (QID) | INTRAMUSCULAR | Status: DC | PRN
  Administered 2013-04-20: 4 mg via INTRAVENOUS
  Filled 2013-04-20: qty 2

## 2013-04-20 MED ORDER — HYDRALAZINE HCL 20 MG/ML IJ SOLN
INTRAMUSCULAR | Status: AC
  Filled 2013-04-20: qty 1

## 2013-04-20 NOTE — Consult Note (Signed)
ELECTROPHYSIOLOGY CONSULT NOTE  Patient ID: Kelly Neal MRN: [id number], DOB/AGE: 07-26-1928   Admit date: 04/19/2013 Date of Consult: 04/20/2013  Primary Physician: Abbe Amsterdam, MD Primary Cardiologist: Royann Shivers, MD  Reason for Consultation: High grade AV block  History of Present Illness Kelly Neal is an 79 year old woman with HTN, dyslipidemia and DM who presented yesterday with dizziness, found to have 2:1 AV block. She reports intermittent dizziness and syncope that have been worsening over the last week. She has persistent fatigue. She describes 4 episodes of unresponsiveness associated with "shaking" witnessed by family. She denies CP, SOB or palpitations. She denies LE swelling, orthopnea or PND. Overnight her telemetry shows 2:1 AV block and CHB with ventricular standstill which was symptomatic with LOC here in the CCU. She is taking low dose carvedilol which has been held.  Past Medical History Past Medical History  Diagnosis Date  . Hyperlipidemia   . Hypertension   . DM type 2 (diabetes mellitus, type 2)     Past Surgical History Past Surgical History  Procedure Laterality Date  . Cataract extraction      Allergies/Intolerances No Known Allergies  Inpatient Medications .  ceFAZolin (ANCEF) IV  2 g Intravenous On Call  . gentamicin irrigation  80 mg Irrigation On Call  . glimepiride  2 mg Oral QAC breakfast  . hydrochlorothiazide  12.5 mg Oral Daily  . lisinopril  10 mg Oral Daily  . sodium chloride  3 mL Intravenous Q12H   . sodium chloride    . sodium chloride     Family History Family History  Problem Relation Age of Onset  . Hypertension Mother   . Heart attack Father     Social History Social History  . Marital Status: Married   Social History Main Topics  . Smoking status: Former Smoker -- 2 years    Types: Cigarettes    Quit date: 11/17/1950  . Smokeless tobacco: No  . Alcohol Use: No  . Drug Use: No   Review of Systems General: No  chills, fever, night sweats or weight changes  Cardiovascular:  No chest pain, dyspnea on exertion, edema, orthopnea, palpitations, paroxysmal nocturnal dyspnea Dermatological: No rash, lesions or masses Respiratory: No cough, dyspnea Urologic: No hematuria, dysuria Abdominal: No nausea, vomiting, diarrhea, bright red blood per rectum, melena, or hematemesis Neurologic: No visual changes All other systems reviewed and are otherwise negative except as noted above.  Physical Exam Blood pressure 175/20, pulse 32, temperature 98.2 F (36.8 C), temperature source Oral, resp. rate 16, height 5' 7.5" (1.715 m), weight 145 lb 12.8 oz (66.134 kg), SpO2 95.00%.  General: Well developed, well appearing 79 year old female in no acute distress. HEENT: Normocephalic, atraumatic. EOMs intact. Sclera nonicteric. Oropharynx clear.  Neck: Supple. No JVD. Lungs: Respirations regular and unlabored, CTA bilaterally. No wheezes, rales or rhonchi. Heart: RRR. S1, S2 present. No murmurs, rub, S3 or S4. Abdomen: Soft, non-tender, non-distended. BS present x 4 quadrants. No hepatosplenomegaly.  Extremities: No clubbing, cyanosis or edema. DP/PT/Radials 2+ and equal bilaterally. Psych: Normal affect. Neuro: Alert and oriented X 3. Moves all extremities spontaneously. Musculoskeletal: No kyphosis. Skin: Intact. Warm and dry. No rashes or petechiae in exposed areas.   Labs Lab Results  Component Value Date   WBC 8.9 04/19/2013   HGB 11.2* 04/19/2013   HCT 32.2* 04/19/2013   MCV 89.0 04/19/2013   PLT 220 04/19/2013    Recent Labs Lab 04/19/13 1709 04/19/13 1957  NA 141  --  K 3.9  --   CL 107  --   CO2 25  --   BUN 27*  --   CREATININE 0.97 0.92  CALCIUM 9.1  --   GLUCOSE 122*  --     Recent Labs  04/19/13 2014  TSH 1.135    Radiology/Studies Dg Chest Port 1 View  04/19/2013   *RADIOLOGY REPORT*  Clinical Data: Bradycardia and dizziness.  PORTABLE CHEST - 1 VIEW  Comparison: None.  Findings: Heart is  enlarged.  There is perihilar peribronchial thickening.  There are no focal consolidations or pleural effusions.  Transcutaneous pacing leads overlie the chest, obscuring some detail. A skin fold overlies the left lung base.  IMPRESSION:  1.  Cardiomegaly without overt edema. 2.  Bronchitic changes.   Original Report Authenticated By: Norva Pavlov, M.D.    12-lead ECG shows 2:1 AV block Telemetry shows 2:1 AV block and one episode of CHB with ventricular standstill    Assessment and Plan 1. 2:1 AV block and CHB with ventricular standstill  Kelly Neal presents with symptomatic high grade AV block. She is on low dose carvedilol which has been held. She has persistent high grade AV block and will most likely require permanent pacing. This was discussed at length with her yesterday by Dr. Royann Shivers and reviewed again today. Risks, benefits and alternatives to PPM implantation were discussed in detail with the patient today. These risks include, but are not limited to, bleeding, infection, pneumothorax, perforation, tamponade, vascular damage, renal failure, lead dislodgement, MI, stroke and death. Kelly Neal expressed verbal understanding and agrees to proceed.   Dr. Ladona Ridgel to see Signed, Rick Duff, PA-C 04/20/2013, 7:08 AM  EP attending  Patient seen and examined. She has very symptomatic 2:1 AV block with long documented pauses. She has been on homeopathic doses of carvedilol. I agree with the recommendations of Dr. Royann Shivers that PPM is indicated. The risks/benefits/goals/expectations of PPM have been discussed with the patient and she wishes to proceed.  Kelly Neal.D.

## 2013-04-20 NOTE — Progress Notes (Signed)
  Echocardiogram 2D Echocardiogram has been performed.  Cassidy Tabet FRANCES 04/20/2013, 11:12 AM 

## 2013-04-20 NOTE — Op Note (Signed)
DDD PPM insertion via the left subclavian vein without immediate complication. U#[id number]

## 2013-04-20 NOTE — Progress Notes (Signed)
[facility]  Subjective: Some dizziness last night.  She could feel her beat earlier.  Objective: Vital signs in last 24 hours: Temp:  [98.2 F (36.8 C)-98.8 F (37.1 C)] 98.3 F (36.8 C) (06/04 0748) Pulse Rate:  [32-71] 33 (06/04 0748) Resp:  [12-21] 14 (06/04 0748) BP: (140-222)/(16-154) 189/36 mmHg (06/04 0748) SpO2:  [93 %-100 %] 100 % (06/04 0748) Weight:  [145 lb 12.8 oz (66.134 kg)-147 lb (66.679 kg)] 145 lb 12.8 oz (66.134 kg) (06/03 2200) Last BM Date: 04/18/13  Intake/Output from previous day: 06/03 0701 - 06/04 0700 In: 120 [P.O.:120] Out: 400 [Urine:400] Intake/Output this shift:    Medications Current Facility-Administered Medications  Medication Dose Route Frequency Provider Last Rate Last Dose  . 0.9 %  sodium chloride infusion   Intravenous Continuous Mihai Croitoru, MD      . 0.9 %  sodium chloride infusion   Intravenous Continuous Mihai Croitoru, MD      . acetaminophen (TYLENOL) tablet 650 mg  650 mg Oral Q6H PRN Mihai Croitoru, MD       Or  . acetaminophen (TYLENOL) suppository 650 mg  650 mg Rectal Q6H PRN Mihai Croitoru, MD      . alum & mag hydroxide-simeth (MAALOX/MYLANTA) 200-200-20 MG/5ML suspension 30 mL  30 mL Oral Q6H PRN Mihai Croitoru, MD      . ceFAZolin (ANCEF) IVPB 2 g/50 mL premix  2 g Intravenous On Call Mihai Croitoru, MD      . gentamicin (GARAMYCIN) 80 mg in sodium chloride irrigation 0.9 % 500 mL irrigation  80 mg Irrigation On Call [employer], MD      . glimepiride (AMARYL) tablet 2 mg  2 mg Oral QAC breakfast Mihai Croitoru, MD      . hydrochlorothiazide (MICROZIDE) capsule 12.5 mg  12.5 mg Oral Daily Flint Melter, MD      . lisinopril (PRINIVIL,ZESTRIL) tablet 10 mg  10 mg Oral Daily Flint Melter, MD      . polyethylene glycol (MIRALAX / GLYCOLAX) packet 17 g  17 g Oral Daily PRN Mihai Croitoru, MD      . sodium chloride 0.9 % injection 3 mL  3 mL Intravenous Q12H Mihai Croitoru, MD   3 mL at  04/19/13 2223    PE: General appearance: alert, cooperative and no distress Lungs: clear to auscultation bilaterally Heart: Reg rhythm very slow. Extremities: No LEE Pulses: 2+ and symmetric Skin: Warm and dry. Neurologic: Grossly normal  Lab Results:   Recent Labs  04/19/13 1709 04/19/13 1957  WBC 8.9 8.9  HGB 11.4* 11.2*  HCT 32.5* 32.2*  PLT 223 220   BMET  Recent Labs  04/19/13 1709 04/19/13 1957  NA 141  --   K 3.9  --   CL 107  --   CO2 25  --   GLUCOSE 122*  --   BUN 27*  --   CREATININE 0.97 0.92  CALCIUM 9.1  --      Assessment/Plan  Principal Problem:   Second degree atrioventricular block, Mobitz (type) I Active Problems:   Hyperlipidemia   Hypertension   DM type 2 (diabetes mellitus, type 2)   Syncope   Symptomatic bradycardia  Plan:  Currently no complaints.  PPM placement today.    LOS: 1 day    HAGER, BRYAN 04/20/2013 8:58 AM  I have seen and evaluated the patient this AM along with Wilburt Finlay, PA. I agree with his findings, examination as  well as impression recommendations.  Symptomatic Bradycardia with an near syncopal episode last PM.   Appreciate [place] [facility] - Dr. Lubertha Basque assistance with PPM placement today.  I suspect her HTN with high pulse pressure is compensatory. On ACE-I (BB obviously being held)  Glycemic control stable.  I was present during her Echo -- will review today.  MD Time with pt: 10 min  HARDING,DAVID W, M.D., M.S. [facility] [address] [place], [place]  [address]  [phone] Pager # [phone] 04/20/2013 11:04 AM

## 2013-04-21 ENCOUNTER — Inpatient Hospital Stay (HOSPITAL_COMMUNITY): Payer: Medicare Other

## 2013-04-21 DIAGNOSIS — Z950 Presence of cardiac pacemaker: Secondary | ICD-10-CM | POA: Diagnosis not present

## 2013-04-21 LAB — GLUCOSE, CAPILLARY: Glucose-Capillary: 195 mg/dL — ABNORMAL HIGH (ref 70–99)

## 2013-04-21 MED ORDER — ACETAMINOPHEN 325 MG PO TABS: 325.0000 mg | ORAL_TABLET | ORAL | Status: DC | PRN

## 2013-04-21 MED ORDER — CARVEDILOL 3.125 MG PO TABS: 3.1250 mg | ORAL_TABLET | Freq: Two times a day (BID) | ORAL | Status: DC

## 2013-04-21 MED ORDER — CARVEDILOL 3.125 MG PO TABS
3.1250 mg | ORAL_TABLET | Freq: Two times a day (BID) | ORAL | Status: DC
  Administered 2013-04-21: 13:00:00 3.125 mg via ORAL
  Filled 2013-04-21: qty 1

## 2013-04-21 NOTE — Progress Notes (Signed)
Utilization Review Completed Lynette Topete J. Brin Ruggerio, RN, BSN, NCM [phone]  

## 2013-04-21 NOTE — Progress Notes (Signed)
Subjective:  Up in chair, B/P was up last night, required Hydralazine.  Objective:  Vital Signs in the last 24 hours: Temp:  [97.7 F (36.5 C)-98.6 F (37 C)] 98.6 F (37 C) (06/05 0805) Pulse Rate:  [59-77] 75 (06/05 0805) Resp:  [16-66] 16 (06/05 0805) BP: (116-215)/(23-63) 162/50 mmHg (06/05 0805) SpO2:  [95 %-100 %] 96 % (06/05 0805) Weight:  [65.3 kg (143 lb 15.4 oz)] 65.3 kg (143 lb 15.4 oz) (06/05 0540)  Intake/Output from previous day:  Intake/Output Summary (Last 24 hours) at 04/21/13 0955 Last data filed at 04/21/13 0900  Gross per 24 hour  Intake   1350 ml  Output   1000 ml  Net    350 ml    Physical Exam: General appearance: alert, cooperative and no distress Lungs: clear to auscultation bilaterally Heart: regular rate and rhythm Pacer site without hematoma   Rate: 70  Rhythm: paced  Lab Results:  Recent Labs  04/19/13 1709 04/19/13 1957  WBC 8.9 8.9  HGB 11.4* 11.2*  PLT 223 220    Recent Labs  04/19/13 1709 04/19/13 1957  NA 141  --   K 3.9  --   CL 107  --   CO2 25  --   GLUCOSE 122*  --   BUN 27*  --   CREATININE 0.97 0.92   No results found for this basename: TROPONINI, CK, MB,  in the last 72 hours Hepatic Function Panel No results found for this basename: PROT, ALBUMIN, AST, ALT, ALKPHOS, BILITOT, BILIDIR, IBILI,  in the last 72 hours No results found for this basename: CHOL,  in the last 72 hours No results found for this basename: INR,  in the last 72 hours  Imaging: Imaging results have been reviewed  Cardiac Studies: Echo- - Left ventricle: The cavity size was normal. Wall thickness was increased in a pattern of moderate to severe concentric LVH. Systolic function was vigorous. The estimated ejection fraction was in the range of 65% to 70%. Wall motion was normal; there were no regional wall motion abnormalities. Doppler parameters are consistent with abnormal left ventricular relaxation (grade 1 diastolic  dysfunction). - Mitral valve: Normal-sized, mildly calcified annulus. Mild calcification of the posterior leaflet. - Left atrium: The atrium was moderately dilated.   Assessment/Plan:   Principal Problem:   Syncope Active Problems:   Second degree atrioventricular block, Mobitz (type) I   Symptomatic bradycardia   Cardiac pacemaker in situ-[employer] implanted 04/20/13   Hypertension   DM type 2 (diabetes mellitus, type 2)   Hyperlipidemia   PLAN: Discussed with Kelly EP, we will follow as an OP. Her primary MD is at [facility]. Coreg resumed. Wound check one week, pacer check 2 months. Home this am if B/P stable after meds given.  Corine Shelter PA-C Beeper [phone] 04/21/2013, 9:55 AM  Looks good post PPM placement.  CXR ok - no PTx, Interrogation of device OK.    Plan is d/c today with wound check & initial MD check per protocol.  Pt instructions provided for d/c.  Marykay Lex, M.D., M.S. [facility] [address] [place], [place]  [address]  [phone] Pager # [phone] 04/21/2013 10:52 AM

## 2013-04-21 NOTE — Progress Notes (Signed)
Pt's SBP 181/46, PA Christain [place] informed, new order received.  Will continue to monitor patient, and blood pressure.

## 2013-04-21 NOTE — Progress Notes (Signed)
   ELECTROPHYSIOLOGY ROUNDING NOTE    Patient Name: Kelly Neal Date of Encounter: 04-21-2013    SUBJECTIVE:Patient feels well this morning.  No chest pain or shortness of breath. Some anxiety last night relieved with Ativan.  Still hypertensive this morning- [facility] managing.  S/p pacemaker implant 04-20-2013  TELEMETRY: Reviewed telemetry pt in sinus rhythm with ventricular pacing Filed Vitals:   04/20/13 2030 04/20/13 2300 04/21/13 0029 04/21/13 0540  BP: 181/46 177/35 163/48 208/61  Pulse: 68  73 73  Temp:   98 F (36.7 C) 98.3 F (36.8 C)  TempSrc:   Oral Oral  Resp:   16 18  Height:      Weight:    143 lb 15.4 oz (65.3 kg)  SpO2: 100%  96% 95%    Intake/Output Summary (Last 24 hours) at 04/21/13 0635 Last data filed at 04/21/13 0601  Gross per 24 hour  Intake    870 ml  Output   1200 ml  Net   -330 ml    LABS: Basic Metabolic Panel:  Recent Labs  14/78/29 1709 04/19/13 1957  NA 141  --   K 3.9  --   CL 107  --   CO2 25  --   GLUCOSE 122*  --   BUN 27*  --   CREATININE 0.97 0.92  CALCIUM 9.1  --    CBC:  Recent Labs  04/19/13 1709 04/19/13 1957  WBC 8.9 8.9  HGB 11.4* 11.2*  HCT 32.5* 32.2*  MCV 88.6 89.0  PLT 223 220   Thyroid Function Tests:  Recent Labs  04/19/13 2014  TSH 1.135     Radiology/Studies:  Pending  PHYSICAL EXAM Left chest without hematoma or ecchymosis   DEVICE INTERROGATION: Device interrogation pending   Wound care, arm mobility, restrictions reviewed with patient.  No follow up planned with [place] at this time unless [facility] would like for [place] to do wound check.   A/P 1. Symptomatic 2:1 heart block 2. HTN 3. S/p PPM Rec: ok from my perspective to dc home. Followup with Dr. Royann Shivers.  Leonia Reeves.D.

## 2013-04-21 NOTE — Op Note (Signed)
Kelly Neal, HABLE NO.:  [id number]  MEDICAL RECORD NO.:  [id number]  LOCATION:  6527                         FACILITY:  [facility]  PHYSICIAN:  Doylene Canning. Ladona Ridgel, MD    DATE OF BIRTH:  11-30-1927  DATE OF PROCEDURE:  04/20/2013 DATE OF DISCHARGE:                              OPERATIVE REPORT   PROCEDURE PERFORMED:  Insertion of a dual-chamber pacemaker utilizing intravenous IV contrast .  INTRODUCTION:  The patient is a very pleasant 79 year old woman who presented to the hospital with recurrent syncope and was found to have prolonged pauses secondary to complete heart block.  She was initially on 3 mg of carvedilol twice a day, which was discontinued and she continued to have pauses and is now referred for dual-chamber pacemaker insertion.  DESCRIPTION OF PROCEDURE:  After informed consent was obtained, the patient was taken to the diagnostic EP lab in a fasting state.  After usual preparation and draping, intravenous fentanyl and midazolam was given for sedation.  A 30 mL of lidocaine was infiltrated into the left infraclavicular region.  A 5-cm incision was carried out over this region.  Electrocautery was utilized to dissect down the fascial plane. Initial attempts to puncture the subclavian vein were unsuccessful. With the guidewire not being able to be advanced.  Venography of the left upper extremity venous system was then carried out with 10 mL of IV contrast injected into the left upper extremity venous system demonstrating that the vein was patent, but it was severely narrowed probably due to spasm.  At this point, the vein was punctured in the more distal portion, and the St. Jude model 2880, 52 cm active fixation pacing lead serial #[id number] was advanced to the right ventricle and the St. Jude model 2880, 46 cm active fixation pacing lead, serial #[id number] was advanced to the right atrium.  Mapping was first carried out in the right ventricle at  the final site.  The R-wave measured 6 mV. The pacing impedance with lead actively fixed was 700 ohms.  The threshold was 0.8 V at 0.4 milliseconds.  A 10 V pacing did not stimulate the diaphragm.  There was a large injury occurred.  Attention was then turned to placement of the atrial lead which was placed in anterolateral portion of the right atrium where P-wave was measured 2 mV.  The pacing impedance was initially 2.5, right at the time of the final implant was 1 at 0.5.  Pacing impedance was 600 ohms in the atrium.  10 V pacing both in the atrium and the ventricle did not stimulate the diaphragm.  With these satisfactory parameters, the leads were secured to the subpectoral fascia with a figure-of-eight silk suture.  The sewing sleeve was secured with silk suture.  Electrocautery was utilized to make subcutaneous pocket.  Antibiotic irrigation was utilized to irrigate the pocket.  The St. Jude Accent DR RF dual-chamber pacemaker serial [id number] was connected to the atrial and RV leads, placed back in the subcutaneous pocket were we secured with silk suture. The pocket was irrigated with antibiotic irrigation.  The incision was closed with 2-0 and 3-0 Vicryl.  Benzoin and Steri-Strips painted on the skin.  A pressure dressing applied, and the patient was returned to her room in satisfactory condition.  COMPLICATIONS:  There were no immediate procedure complications.  RESULTS:  This demonstrate successful implantation of a St. Jude dual- chamber pacemaker in the patient with symptomatic 2:1 heart block and syncope.     Doylene Canning. Ladona Ridgel, MD     GWT/MEDQ  D:  04/20/2013  T:  04/21/2013  Job:  [id number]  cc:   Thurmon Fair, MD

## 2013-04-21 NOTE — Discharge Summary (Signed)
Patient ID: Kelly Neal,  MRN: [id number], DOB/AGE: 1928-02-14 79 y.o.  Admit date: 04/19/2013 Discharge date: 04/21/2013  Primary Care Provider: [facility] Primary Cardiologist: Dr Royann Shivers  Discharge Diagnoses  Principal Problem:   Syncope Active Problems:   Second degree atrioventricular block, Mobitz (type) I   Symptomatic bradycardia   Cardiac pacemaker in situ-St Jude implanted 04/20/13   Hypertension   DM type 2 (diabetes mellitus, type 2)   Hyperlipidemia    Procedures: St Jude pacemaker implant 04/20/13   Hospital Course:  [name] is an 79 year old woman with HTN, dyslipidemia and DM who presented 04/19/13 with dizziness and was found to have 2:1 AV block. She reported intermittent dizziness and syncope that have been worsening over the last week. She has persistent fatigue. She describes 4 episodes of unresponsiveness associated with "shaking" witnessed by family. Overnight her telemetry showed 2:1 AV block and CHB with ventricular standstill which was symptomatic with LOC in the CCU. She had taking low dose carvedilol which has been held. She underwent implantation of a St Jude pacemaker 04/20/13 by Dr Ladona Ridgel. She tolerated this well. CXR showed no ptx. Echo showed an EF of 65-70% with grade 1 diastolic dysfunction. She will be discharge 04/21/13 and come to the clinic in a week for a site check and see Dr Royann Shivers in 2 months for a pacer check. Her B/P was elevated after her procedure but this is under better control at discharge. Coreg has been resumed.    Discharge Vitals:  Blood pressure 162/50, pulse 75, temperature 98.6 F (37 C), temperature source Oral, resp. rate 16, height 5' 7.5" (1.715 m), weight 65.3 kg (143 lb 15.4 oz), SpO2 96.00%.    Labs: Results for orders placed during the hospital encounter of 04/19/13 (from the past 48 hour(s))  CBC     Status: Abnormal   Collection Time    04/19/13  5:09 PM      Result Value Range   WBC 8.9  4.0 - 10.5 K/uL   RBC 3.67 (*) 3.87 - 5.11 MIL/uL   Hemoglobin 11.4 (*) 12.0 - 15.0 g/dL   HCT 40.9 (*) 81.1 - 91.4 %   MCV 88.6  78.0 - 100.0 fL   MCH 31.1  26.0 - 34.0 pg   MCHC 35.1  30.0 - 36.0 g/dL   RDW 78.2  95.6 - 21.3 %   Platelets 223  150 - 400 K/uL  BASIC METABOLIC PANEL     Status: Abnormal   Collection Time    04/19/13  5:09 PM      Result Value Range   Sodium 141  135 - 145 mEq/L   Potassium 3.9  3.5 - 5.1 mEq/L   Chloride 107  96 - 112 mEq/L   CO2 25  19 - 32 mEq/L   Glucose, Bld 122 (*) 70 - 99 mg/dL   BUN 27 (*) 6 - 23 mg/dL   Creatinine, Ser 0.86  0.50 - 1.10 mg/dL   Calcium 9.1  8.4 - 57.8 mg/dL   GFR calc non Af Amer 53 (*) >90 mL/min   GFR calc Af Amer 61 (*) >90 mL/min   Comment:            The eGFR has been calculated     using the CKD EPI equation.     This calculation has not been     validated in all clinical     situations.     eGFR's persistently     <  90 mL/min signify     possible Chronic Kidney Disease.  URINALYSIS, ROUTINE W REFLEX MICROSCOPIC     Status: Abnormal   Collection Time    04/19/13  6:00 PM      Result Value Range   Color, Urine YELLOW  YELLOW   APPearance CLEAR  CLEAR   Specific Gravity, Urine 1.013  1.005 - 1.030   pH 6.5  5.0 - 8.0   Glucose, UA NEGATIVE  NEGATIVE mg/dL   Hgb urine dipstick SMALL (*) NEGATIVE   Bilirubin Urine NEGATIVE  NEGATIVE   Ketones, ur NEGATIVE  NEGATIVE mg/dL   Protein, ur >161 (*) NEGATIVE mg/dL   Urobilinogen, UA 0.2  0.0 - 1.0 mg/dL   Nitrite NEGATIVE  NEGATIVE   Leukocytes, UA TRACE (*) NEGATIVE  URINE CULTURE     Status: None   Collection Time    04/19/13  6:00 PM      Result Value Range   Specimen Description URINE, CLEAN CATCH     Special Requests NONE     Culture  Setup Time 04/19/2013 22:29     Colony Count 70,000 COLONIES/ML     Culture ESCHERICHIA COLI     Report Status PENDING    URINE MICROSCOPIC-ADD ON     Status: Abnormal   Collection Time    04/19/13  6:00 PM      Result Value Range    Squamous Epithelial / LPF FEW (*) RARE   WBC, UA 3-6  <3 WBC/hpf   RBC / HPF 3-6  <3 RBC/hpf   Bacteria, UA FEW (*) RARE   Casts HYALINE CASTS (*) NEGATIVE   Urine-Other MUCOUS PRESENT     Comment: AMORPHOUS URATES/PHOSPHATES  POCT I-STAT TROPONIN I     Status: None   Collection Time    04/19/13  6:54 PM      Result Value Range   Troponin i, poc 0.01  0.00 - 0.08 ng/mL   Comment 3            Comment: Due to the release kinetics of cTnI,     a negative result within the first hours     of the onset of symptoms does not rule out     myocardial infarction with certainty.     If myocardial infarction is still suspected,     repeat the test at appropriate intervals.  CBC     Status: Abnormal   Collection Time    04/19/13  7:57 PM      Result Value Range   WBC 8.9  4.0 - 10.5 K/uL   RBC 3.62 (*) 3.87 - 5.11 MIL/uL   Hemoglobin 11.2 (*) 12.0 - 15.0 g/dL   HCT 09.6 (*) 04.5 - 40.9 %   MCV 89.0  78.0 - 100.0 fL   MCH 30.9  26.0 - 34.0 pg   MCHC 34.8  30.0 - 36.0 g/dL   RDW 81.1  91.4 - 78.2 %   Platelets 220  150 - 400 K/uL  CREATININE, SERUM     Status: Abnormal   Collection Time    04/19/13  7:57 PM      Result Value Range   Creatinine, Ser 0.92  0.50 - 1.10 mg/dL   GFR calc non Af Amer 56 (*) >90 mL/min   GFR calc Af Amer 65 (*) >90 mL/min   Comment:            The eGFR has been calculated     using  the CKD EPI equation.     This calculation has not been     validated in all clinical     situations.     eGFR's persistently     <90 mL/min signify     possible Chronic Kidney Disease.  TSH     Status: None   Collection Time    04/19/13  8:14 PM      Result Value Range   TSH 1.135  0.350 - 4.500 uIU/mL  MRSA PCR SCREENING     Status: None   Collection Time    04/19/13 10:39 PM      Result Value Range   MRSA by PCR NEGATIVE  NEGATIVE   Comment:            The GeneXpert MRSA Assay (FDA     approved for NASAL specimens     only), is one component of a      comprehensive MRSA colonization     surveillance program. It is not     intended to diagnose MRSA     infection nor to guide or     monitor treatment for     MRSA infections.  GLUCOSE, CAPILLARY     Status: Abnormal   Collection Time    04/20/13  7:51 AM      Result Value Range   Glucose-Capillary 133 (*) 70 - 99 mg/dL  GLUCOSE, CAPILLARY     Status: Abnormal   Collection Time    04/20/13 11:57 AM      Result Value Range   Glucose-Capillary 101 (*) 70 - 99 mg/dL  GLUCOSE, CAPILLARY     Status: Abnormal   Collection Time    04/20/13  2:26 PM      Result Value Range   Glucose-Capillary 116 (*) 70 - 99 mg/dL  GLUCOSE, CAPILLARY     Status: Abnormal   Collection Time    04/20/13  5:30 PM      Result Value Range   Glucose-Capillary 235 (*) 70 - 99 mg/dL  GLUCOSE, CAPILLARY     Status: Abnormal   Collection Time    04/20/13 10:07 PM      Result Value Range   Glucose-Capillary 147 (*) 70 - 99 mg/dL   Comment 1 Notify RN     Comment 2 Documented in Chart    GLUCOSE, CAPILLARY     Status: Abnormal   Collection Time    04/21/13  8:06 AM      Result Value Range   Glucose-Capillary 195 (*) 70 - 99 mg/dL    Disposition:  Follow-up Information   Follow up with Abelino Derrick, PA-C On 04/28/2013. (10:20)    Contact information:   [address] [place] [address] [phone]       Discharge Medications:    Medication List    TAKE these medications       acetaminophen 325 MG tablet  Commonly known as:  TYLENOL  Take 1-2 tablets (325-650 mg total) by mouth every 4 (four) hours as needed for pain.     carvedilol 3.125 MG tablet  Commonly known as:  COREG  Take 1 tablet (3.125 mg total) by mouth 2 (two) times daily with a meal.     glimepiride 2 MG tablet  Commonly known as:  AMARYL  Take 1 tablet (2 mg total) by mouth daily before breakfast.     lisinopril-hydrochlorothiazide 10-12.5 MG per tablet  Commonly known as:  PRINZIDE,ZESTORETIC  Take 0.5  tablets by mouth 2 (two) times daily.     metFORMIN 500 MG 24 hr tablet  Commonly known as:  GLUCOPHAGE-XR  Take 750 mg by mouth daily with breakfast.     pravastatin 20 MG tablet  Commonly known as:  PRAVACHOL  Take 1 tablet (20 mg total) by mouth daily.        Duration of Discharge Encounter: Greater than 30 minutes including physician time.  Jolene Provost PA-C 04/21/2013 10:05 AM  Admitted for symptomatic bradycardia -- Appreciate [place] EP assistance with PPM yesterday.    Looks good post PPM placement. CXR ok - no PTx, Interrogation of device OK.  Plan is d/c today with wound check & initial MD check per protocol.  Pt instructions provided for d/c.   Marykay Lex, M.D., M.S. [facility] [address] [place], [place]  [address]  [phone] Pager # [phone] 04/21/2013 10:53 AM

## 2013-04-22 ENCOUNTER — Telehealth: Payer: Self-pay | Admitting: Family Medicine

## 2013-04-22 DIAGNOSIS — N390 Urinary tract infection, site not specified: Secondary | ICD-10-CM

## 2013-04-22 DIAGNOSIS — B3749 Other urogenital candidiasis: Secondary | ICD-10-CM

## 2013-04-22 LAB — URINE CULTURE

## 2013-04-22 MED ORDER — AMOXICILLIN 875 MG PO TABS: 875.0000 mg | ORAL_TABLET | Freq: Two times a day (BID) | ORAL | Status: DC

## 2013-04-22 NOTE — Telephone Encounter (Signed)
Called- I received her urine culture which was positive for a UTI.  I did not order this but do not see where anyone else is following up on this issue.  Called her home and was able to speak with her daughter Babette Relic.  They had not yet heard about this urine culture, and would appreciate having abx called in.  No drug allergies.  I will treat her UTI with amox 875 BID for one week (e coli is sensitive to ampicillin).  They are to let me know if any problems.

## 2013-04-28 ENCOUNTER — Ambulatory Visit (INDEPENDENT_AMBULATORY_CARE_PROVIDER_SITE_OTHER): Payer: Medicare Other | Admitting: Cardiology

## 2013-04-28 ENCOUNTER — Encounter: Payer: Self-pay | Admitting: Cardiology

## 2013-04-28 ENCOUNTER — Ambulatory Visit: Payer: Medicare Other

## 2013-04-28 VITALS — BP 142/78 | HR 64 | Ht 67.5 in | Wt 144.2 lb

## 2013-04-28 DIAGNOSIS — Z950 Presence of cardiac pacemaker: Secondary | ICD-10-CM

## 2013-04-28 NOTE — Patient Instructions (Signed)
No standing water- pools, hot tubs, ocean or lake swimming.

## 2013-04-28 NOTE — Progress Notes (Signed)
04/28/2013 Kelly Neal   [birth year]  [id number]  Primary Physicia Abbe Amsterdam, MD Primary Cardiologist: Dr Royann Shivers  HPI:  [name] is an 79 year old woman with HTN, dyslipidemia and DM who presented 04/19/13 with dizziness and was found to have 2:1 AV block. She reported intermittent dizziness and syncope that have been worsening over the last week. She has persistent fatigue. She describes 4 episodes of unresponsiveness associated with "shaking" witnessed by family. Overnight her telemetry showed 2:1 AV block and CHB with ventricular standstill which was symptomatic with LOC in the CCU. She had taking low dose carvedilol which has been held. She underwent implantation of a St Jude pacemaker 04/20/13 by Dr Ladona Ridgel. She tolerated this well. CXR showed no ptx. Echo showed an EF of 65-70% with grade 1 diastolic dysfunction         She is in the office today for a site check. She has been doing well. Her site looks good. Steri strips removed.   Current Outpatient Prescriptions  Medication Sig Dispense Refill  . acetaminophen (TYLENOL) 325 MG tablet Take 1-2 tablets (325-650 mg total) by mouth every 4 (four) hours as needed for pain.      Marland Kitchen amoxicillin (AMOXIL) 875 MG tablet Take 1 tablet (875 mg total) by mouth 2 (two) times daily. For urinary tract infection  14 tablet  0  . carvedilol (COREG) 3.125 MG tablet Take 1 tablet (3.125 mg total) by mouth 2 (two) times daily with a meal.      . glimepiride (AMARYL) 2 MG tablet Take 1 tablet (2 mg total) by mouth daily before breakfast.  90 tablet  2  . lisinopril-hydrochlorothiazide (PRINZIDE,ZESTORETIC) 10-12.5 MG per tablet Take 0.5 tablets by mouth 2 (two) times daily.      . metFORMIN (GLUCOPHAGE-XR) 500 MG 24 hr tablet Take 750 mg by mouth daily with breakfast.      . pravastatin (PRAVACHOL) 20 MG tablet Take 1 tablet (20 mg total) by mouth daily.  90 tablet  2   No current facility-administered medications for this visit.    No Known  Allergies  History   Social History  . Marital Status: Married    Spouse Name: N/A    Number of Children: N/A  . Years of Education: N/A   Occupational History  . Not on file.   Social History Main Topics  . Smoking status: Former Smoker -- 2 years    Types: Cigarettes    Quit date: 11/17/1950  . Smokeless tobacco: Not on file  . Alcohol Use: No  . Drug Use: No  . Sexually Active: No   Other Topics Concern  . Not on file   Social History Narrative  . No narrative on file     Review of Systems: General: negative for chills, fever, night sweats or weight changes.  Cardiovascular: negative for chest pain, dyspnea on exertion, edema, orthopnea, palpitations, paroxysmal nocturnal dyspnea or shortness of breath Dermatological: negative for rash Respiratory: negative for cough or wheezing Urologic: negative for hematuria Abdominal: negative for nausea, vomiting, diarrhea, bright red blood per rectum, melena, or hematemesis Neurologic: negative for visual changes, syncope, or dizziness All other systems reviewed and are otherwise negative except as noted above.    Blood pressure 142/78, pulse 64, height 5' 7.5" (1.715 m), weight 144 lb 3.2 oz (65.409 kg).  General appearance: alert, cooperative and no distress Lungs: clear to auscultation bilaterally Heart: regular rate and rhythm, S1, S2 normal, no murmur, click, rub or gallop  Pacer site without ecchymosis, hematoma, or signs of infection.   ASSESSMENT AND PLAN:   Cardiac pacemaker in situ-St Jude implanted 04/20/13 Pacer site check looked good.   PLAN  Follow up with Dr Royann Shivers in 2 months   [facility] KPA-C 04/28/2013 10:45 AM

## 2013-04-28 NOTE — Assessment & Plan Note (Signed)
Pacer site check looked good.

## 2013-05-04 ENCOUNTER — Telehealth (HOSPITAL_COMMUNITY): Payer: Self-pay | Admitting: Cardiovascular Disease

## 2013-05-04 DIAGNOSIS — Z79899 Other long term (current) drug therapy: Secondary | ICD-10-CM

## 2013-05-04 NOTE — Telephone Encounter (Signed)
Returned call.  Pt c/o dizziness x 2.5 days.  Also stated from her neck up "feels strange."  Stated she thought it was the BP medicine bothering her and she cut back on it.  Stated before the pacemaker was put in they took her off of one of them and afterwards they told her to start taking it again and to take two/day.  Stated the medication is lisinopril.  Stated she usually never feels her heart beating, but she has been the last couple of days.  Pt denied s/s of stroke.  Tollie Pizza, CMA notified r/t device concerns and unaware of pacer causing problems w/ feeling strange from neck up.  Pt may need to be seen for evaluation of BP and med adjustment.  Call to pt and informed.  Offered appt to be seen today and pt declined.  Pt would like Dr. Salena Saner to be notified to advise her on what to do.  Message forwarded to Dr. Royann Shivers.

## 2013-05-04 NOTE — Telephone Encounter (Signed)
Returned call and informed pt per instructions by MD/PA.  Pt stated she does not have a monitor and there is not a pharmacy close where she can go get it checked regularly.  Stated she will do her best to check it.  Pt informed Dr. Salena Saner will be notified.  Pt verbalized understanding and agreed w/ plan.

## 2013-05-04 NOTE — Telephone Encounter (Signed)
Patient states that she has a new pacemaker.  She was here a few days ago to see [facility].  For the past couple of days she has felt dizzy and "not that great".   Please call

## 2013-05-04 NOTE — Telephone Encounter (Signed)
Stay off lisinopril until her follow up. Can she check BP at home?

## 2013-05-05 NOTE — Telephone Encounter (Signed)
Returned call.  Busy.  Will try later.

## 2013-05-05 NOTE — Telephone Encounter (Signed)
Pt called back.  Pt stated BP the average was 171/77.  The lowest was 133/64 and the highest was 211/93.  Pt stated she thought her BP was low and that's what was causing her symptoms.  Stated now she is willing to do what the doctor thinks is best.  Pt stated she will take "a tiny bit" of the BP medicine until a response from the doctor.  Pt informed RN will discuss with MD/PA for further instructions.  Pt verbalized understanding and agreed w/ plan.  Message forwarded to Dr. Royann Shivers.

## 2013-05-05 NOTE — Telephone Encounter (Signed)
Please take half of the prescribed dose of lisinopril hydrochlorothiazide on a daily basis.  Please continue recording blood pressure daily basis while sitting down and relaxed. Bring a record of her blood pressure readings to her appointment  Check a basic metabolic panel (bmet) maybe her symptoms are related to a potassium abnormality  Keep the scheduled appointment

## 2013-05-05 NOTE — Telephone Encounter (Signed)
Call asking for you-says she was suppose to give you some readings for you to give to the doctor!

## 2013-05-06 ENCOUNTER — Other Ambulatory Visit: Payer: Self-pay | Admitting: Radiology

## 2013-05-06 NOTE — Telephone Encounter (Signed)
Returned call and informed pt per instructions by MD/PA.  Pt verbalized understanding and agreed w/ plan.  Pt will come to [facility] at [place] on Monday.

## 2013-05-09 ENCOUNTER — Telehealth: Payer: Self-pay | Admitting: Cardiovascular Disease

## 2013-05-09 NOTE — Telephone Encounter (Signed)
Unable to come for  Lab work today-said she recently had all of that did! Also wants to give you some BP readings!

## 2013-05-09 NOTE — Telephone Encounter (Signed)
Returned call.  Pt stated she couldn't get to the lab today b/c she didn't have a ride.  Pt also stated she checked her BP four times yesterday and it was: 168/71, 147/50, 135/54, and 130/67.  Pt also asking if it's normal to feel her heart beating.  Pt informed that since she has a pacemaker, it is not abnormal to feel her heart beating since she is being paced at a particular rhythm.  Pt advised to continue taking lisinopril-hctz as rx'd and to be sure she is staying hydrated r/t the "water pill" part of her med.  Pt stated she wasn't aware that she had that and wondered why she was using the bathroom more.  Pt advised to increase water intake since she stated she sips water throughout the day.  Since pt has transportation issues, pt advised to try to check BP at least weekly and keep a record of them to bring to her appt in August.  Pt verbalized understanding and agreed w/ plan.

## 2013-05-19 ENCOUNTER — Telehealth: Payer: Self-pay | Admitting: Cardiovascular Disease

## 2013-05-19 DIAGNOSIS — Z79899 Other long term (current) drug therapy: Secondary | ICD-10-CM

## 2013-05-19 NOTE — Telephone Encounter (Signed)
Having  A lot of problems-thinks pacemaker might be going too fast!p

## 2013-05-24 NOTE — Telephone Encounter (Signed)
Returned call.  Pt stated she has an appt w/ Dr. Salena Saner next month.  Pt also stated she has been taking her BP and wants to know if she can e-mail them to Dr. Salena Saner.  Pt informed her MyChart acct is active and advised she send it through MyChart.  Pt stated she will let her daughter help her.  Pt also stated she thinks she knows how to describe her head.  Stated it's like having a "continual hot flash" and she feels flushed.  Offered pt a sooner appt and pt declined r/t transportation issues.  Asked pt if she would be able to get her labs done soon as lab order was cancelled b/c it was never done and an electrolyte imbalance may be causing her symptoms as well.  Pt verbalized understanding and agreed to have lab done today or tomorrow when she has transportation.  Pt called back and asked if she needed to be fasting.  Pt advised to be fasting since glucose is a part of the test and she is diabetic.  Pt also informed she may need to be seen sooner by another provider if results indicate so.  Pt verbalized understanding.

## 2013-05-24 NOTE — Telephone Encounter (Signed)
Been waiting to hear from you-talked to you on Thursday!

## 2013-05-25 ENCOUNTER — Other Ambulatory Visit: Payer: Self-pay | Admitting: Cardiovascular Disease

## 2013-05-25 LAB — BASIC METABOLIC PANEL
CO2: 26 mEq/L (ref 19–32)
Chloride: 101 mEq/L (ref 96–112)
Glucose, Bld: 136 mg/dL — ABNORMAL HIGH (ref 70–99)
Sodium: 136 mEq/L (ref 135–145)

## 2013-05-26 ENCOUNTER — Ambulatory Visit (INDEPENDENT_AMBULATORY_CARE_PROVIDER_SITE_OTHER): Payer: Medicare Other | Admitting: Cardiology

## 2013-05-26 ENCOUNTER — Encounter: Payer: Self-pay | Admitting: Cardiology

## 2013-05-26 ENCOUNTER — Other Ambulatory Visit: Payer: Self-pay | Admitting: Cardiovascular Disease

## 2013-05-26 VITALS — BP 164/80 | HR 62 | Ht 67.5 in | Wt 144.6 lb

## 2013-05-26 DIAGNOSIS — Z950 Presence of cardiac pacemaker: Secondary | ICD-10-CM

## 2013-05-26 DIAGNOSIS — R42 Dizziness and giddiness: Secondary | ICD-10-CM

## 2013-05-26 DIAGNOSIS — E119 Type 2 diabetes mellitus without complications: Secondary | ICD-10-CM

## 2013-05-26 DIAGNOSIS — I10 Essential (primary) hypertension: Secondary | ICD-10-CM

## 2013-05-26 LAB — PACEMAKER DEVICE OBSERVATION

## 2013-05-26 NOTE — Assessment & Plan Note (Signed)
She continues to complain of intermittent dizziness. Usually when going from sitting to standing but I could not document orthostatic B/P changes today in the office. Her family describes one or two "shaking episodes" - ? Seizures.

## 2013-05-26 NOTE — Telephone Encounter (Signed)
Pt called back.  Appt scheduled for today at 2:40pm w/ L. Kilroy, PA-C and appt for tomorrow cancelled.  Pt informed her results can be reviewed at appt today.  Pt verbalized understanding and agreed w/ plan.

## 2013-05-26 NOTE — Patient Instructions (Signed)
Stay hydrated. Follow up with Neurologist.

## 2013-05-26 NOTE — Progress Notes (Signed)
05/26/2013 Kelly Neal   [birth year]  [id number]  Primary Physicia Abbe [place], MD Primary Cardiologist: Dr Royann Shivers  HPI:  [name] is an 79 year old woman with HTN, dyslipidemia and DM who presented 04/19/13 with dizziness and was found to have 2:1 AV block. She reported intermittent dizziness and syncope that have been worsening over the last week. She has persistent fatigue. She describes 4 episodes of unresponsiveness associated with "shaking" witnessed by family. Overnight her telemetry showed 2:1 AV block and CHB with ventricular standstill which was symptomatic with LOC in the CCU. She had taking low dose carvedilol which has been held. She underwent implantation of a St Jude pacemaker 04/20/13 by Dr Ladona Ridgel. She tolerated this well. CXR showed no ptx. Echo showed an EF of 65-70% with grade 1 diastolic dysfunction. She was seen in the office for a site check 04/28/13 and was doing well.         She presented to the office today with complaints of dizziness. She denies syncope. Her daughter accompanied her. The pt says her symptoms are similar to her pre pacemaker symptoms. Her pacer was interrogated and was functioning normally with no arrythmia noted. I checked orthostatic B/Ps on her and she was not orthostatic 160/90 laying flat, 162/90 sitting, 162/82 standing, and 156/78 standing after 3 minutes.    Current Outpatient Prescriptions  Medication Sig Dispense Refill  . carvedilol (COREG) 3.125 MG tablet Take 1 tablet (3.125 mg total) by mouth 2 (two) times daily with a meal.      . glimepiride (AMARYL) 2 MG tablet Take 1 tablet (2 mg total) by mouth daily before breakfast.  90 tablet  2  . lisinopril-hydrochlorothiazide (PRINZIDE,ZESTORETIC) 10-12.5 MG per tablet Take 0.5 tablets by mouth 2 (two) times daily.      . metFORMIN (GLUCOPHAGE-XR) 500 MG 24 hr tablet 500 mg. Take 1/2 tablet in the morning, 1 tablet in the evening      . pravastatin (PRAVACHOL) 20 MG tablet Take 1 tablet (20 mg  total) by mouth daily.  90 tablet  2   No current facility-administered medications for this visit.    No Known Allergies  History   Social History  . Marital Status: Married    Spouse Name: N/A    Number of Children: N/A  . Years of Education: N/A   Occupational History  . Not on file.   Social History Main Topics  . Smoking status: Former Smoker -- 2 years    Types: Cigarettes    Quit date: 11/17/1950  . Smokeless tobacco: Not on file  . Alcohol Use: No  . Drug Use: No  . Sexually Active: No   Other Topics Concern  . Not on file   Social History Narrative  . No narrative on file     Review of Systems: General: negative for chills, fever, night sweats or weight changes.  Cardiovascular: negative for chest pain, dyspnea on exertion, edema, orthopnea, palpitations, paroxysmal nocturnal dyspnea or shortness of breath Dermatological: negative for rash Respiratory: negative for cough or wheezing Urologic: negative for hematuria Abdominal: negative for nausea, vomiting, diarrhea, bright red blood per rectum, melena, or hematemesis Neurologic: negative for visual changes, syncope, or dizziness All other systems reviewed and are otherwise negative except as noted above.    Blood pressure 164/80, pulse 62, height 5' 7.5" (1.715 m), weight 144 lb 9.6 oz (65.59 kg).  General appearance: alert, cooperative, no distress and thin Lungs: clear to auscultation bilaterally Heart: regular rate and  rhythm Neurologic: Grossly normal  EKG  EKG: unchanged from previous tracings. paced  ASSESSMENT AND PLAN:   Dizzy spells She continues to complain of intermittent dizziness. Usually when going from sitting to standing but I could not document orthostatic B/P changes today in the office. Her family describes one or two "shaking episodes" - ? Seizures.  Cardiac pacemaker in situ-St Jude implanted 04/20/13 For symptomatic bradycardia and AVB  Hypertension .  DM type 2 (diabetes  mellitus, type 2) .   PLAN: She is to se Dr Royann Shivers in August. I have suggested she seek an opinion from a neurologist if her symptoms persists as it does not appear to be cardiac related.   [facility] 05/26/2013 5:33 PM

## 2013-05-26 NOTE — Telephone Encounter (Signed)
Pt had lab drawn and also scheduled appt for tomorrow w/ Franky Macho, PA-C.  Call to pt to see how she is feeling.  Pt stated she feels the same, not worse and not better.  Asked pt if she'd like to be seen today.  Pt will call daughter to see if she can bring her in today and call back for appt.

## 2013-05-26 NOTE — Assessment & Plan Note (Signed)
For symptomatic bradycardia and AVB

## 2013-05-27 ENCOUNTER — Ambulatory Visit: Payer: Medicare Other | Admitting: Cardiology

## 2013-05-31 ENCOUNTER — Encounter: Payer: Self-pay | Admitting: Cardiovascular Disease

## 2013-06-08 ENCOUNTER — Telehealth: Payer: Self-pay | Admitting: Cardiovascular Disease

## 2013-06-08 NOTE — Telephone Encounter (Signed)
Want to talk to you about My Chart.She asked for you(Amber)

## 2013-06-09 ENCOUNTER — Ambulatory Visit (INDEPENDENT_AMBULATORY_CARE_PROVIDER_SITE_OTHER): Payer: Medicare Other | Admitting: Neurology

## 2013-06-09 ENCOUNTER — Encounter: Payer: Self-pay | Admitting: Neurology

## 2013-06-09 VITALS — BP 181/68 | HR 66 | Temp 98.0°F | Ht 67.5 in | Wt 143.0 lb

## 2013-06-09 DIAGNOSIS — Z950 Presence of cardiac pacemaker: Secondary | ICD-10-CM

## 2013-06-09 DIAGNOSIS — R569 Unspecified convulsions: Secondary | ICD-10-CM

## 2013-06-09 DIAGNOSIS — R55 Syncope and collapse: Secondary | ICD-10-CM

## 2013-06-09 DIAGNOSIS — R42 Dizziness and giddiness: Secondary | ICD-10-CM

## 2013-06-09 NOTE — Telephone Encounter (Signed)
Returned call.  Pt asked what is BUN.  Pt informed it is a test to check her kidney function as well as Creatinine.  Pt informed both were elevated.  Stated [place], [place] told her to drink more water.  Pt informed she should increase water intake to keep her kidneys hydrated.  Pt verbalized understanding and agreed w/ plan.

## 2013-06-09 NOTE — Patient Instructions (Addendum)
I think overall you are doing fairly well but I do want to suggest a few things today:  Remember to drink plenty of fluid, eat healthy meals and do not skip any meals. Try to eat protein with a every meal and eat a healthy snack such as fruit or nuts in between meals. Try to keep a regular sleep-wake schedule and try to exercise daily, particularly in the form of walking, 20-30 minutes a day, if you can.   Please use a walker for safety.   As far as your medications are concerned, I would like to suggest no new meds.    As far as diagnostic testing: CT head, EEG (brain wave test), Carotid doppler Ultrasound. Since you want to think about these tests, you can call us back when you are ready.  I would like to see you back as needed. If we pursue above testing I will see you back in follow up after the tests.   Please also call us for any test results so we can go over those with you on the phone. Brett Canales is my clinical assistant and will answer any of your questions and relay your messages to me and also relay most of my messages to you.  Our phone number is [phone]. We also have an after hours call service for urgent matters and there is a physician on-call for urgent questions. For any emergencies you know to call 911 or go to the nearest emergency room.

## 2013-06-09 NOTE — Progress Notes (Signed)
Subjective:    Patient ID: Kelly Neal is a 79 y.o. female.  HPI  Huston Foley, MD, PhD [facility] [address] [address] [place], [place] [address]  Dear Franky Macho,    I saw your patient, Kelly Neal, upon your kind request in my neurologic clinic today for initial consultation of her complaint of dizziness. The patient is unaccompanied today. As you know, Kelly Neal is a very pleasant 79 year old right-handed woman with an underlying medical history of type 2 diabetes, hypertension, heart block status post pacemaker placement who has been experiencing dizzy spells for the past 2 months, usually with change of position from sitting to standing. Recently at the cardiology office she had no documentation of orthostatic blood pressure values. Per family there have been episodes of shaking as witnessed by her family. There have been about 4 episodes of unresponsiveness associated with trembling or shaking, but this was before the PM. She had a pacemaker placed on 04/20/2013 by Dr. Ladona Ridgel. She had a chest x-ray recently as well as an echocardiogram that showed an EF of 65-70 with grade 1 diastolic dysfunction. Her pacemaker works well. Symptoms are similar to pre-pacemaker symptoms per patient. No recent arrhythmia has been noted. She is currently on Coreg, Amaryl, Prinzide, Glucophage, Pravachol. She has had no LOC since the PM and no shaking spell. She does still feel dizzy in that she feels lightheaded, hot flashes without feeling hot. She denies vertigo, no CP, no SOB, no B/B dysfunction, no prior head injury, no prior Sz, no FHx of Sz. Never had TIA or stroke symptoms, denying sudden onset of one sided weakness, numbness, tingling, slurring of speech or droopy face, hearing loss, tinnitus, diplopia or visual field cut or monocular loss of vision, and denies recurrent headaches. She had a UTI at the time of her PM placement and was treated with ABx.    Her Past  Medical History Is Significant For: Past Medical History  Diagnosis Date  . Hyperlipidemia   . Hypertension   . DM type 2 (diabetes mellitus, type 2)     Her Past Surgical History Is Significant For: Past Surgical History  Procedure Laterality Date  . Cataract extraction      Her Family History Is Significant For: Family History  Problem Relation Age of Onset  . Hypertension Mother   . Heart attack Father     Her Social History Is Significant For: History   Social History  . Marital Status: Married    Spouse Name: N/A    Number of Children: N/A  . Years of Education: N/A   Social History Main Topics  . Smoking status: Former Smoker -- 2 years    Types: Cigarettes    Quit date: 11/17/1950  . Smokeless tobacco: None  . Alcohol Use: No  . Drug Use: No  . Sexually Active: No   Other Topics Concern  . None   Social History Narrative  . None    Her Allergies Are:  No Known Allergies:   Her Current Medications Are:  Outpatient Encounter Prescriptions as of 06/09/2013  Medication Sig Dispense Refill  . carvedilol (COREG) 3.125 MG tablet Take 1 tablet (3.125 mg total) by mouth 2 (two) times daily with a meal.      . glimepiride (AMARYL) 2 MG tablet Take 1 tablet (2 mg total) by mouth daily before breakfast.  90 tablet  2  . lisinopril-hydrochlorothiazide (PRINZIDE,ZESTORETIC) 10-12.5 MG per tablet Take 0.5 tablets by mouth  2 (two) times daily.      . metFORMIN (GLUCOPHAGE-XR) 500 MG 24 hr tablet 500 mg. Take 1/2 tablet in the morning, 1 tablet in the evening      . pravastatin (PRAVACHOL) 20 MG tablet Take 1 tablet (20 mg total) by mouth daily.  90 tablet  2   No facility-administered encounter medications on file as of 06/09/2013.  : Review of Systems  Constitutional: Positive for appetite change and fatigue.  Eyes: Positive for photophobia and visual disturbance.  Neurological: Positive for dizziness.    Objective:  Neurologic Exam  Physical Exam Physical  Examination:   Filed Vitals:   06/09/13 1418  BP: 181/68  Pulse: 66  Temp: 98 F (36.7 C)    General Examination: The patient is a very pleasant 79 y.o. female in no acute distress. She appears well-developed and well-nourished and well groomed.   HEENT: Normocephalic, atraumatic, pupils are equal, round and reactive to light and accommodation. Funduscopic exam is normal with sharp disc margins noted. Extraocular tracking is good without limitation to gaze excursion or nystagmus noted. Normal smooth pursuit is noted. Hearing is grossly intact. Tympanic membranes are clear bilaterally. Face is symmetric with normal facial animation and normal facial sensation. Speech is clear with no dysarthria noted. There is no hypophonia. There is no lip, neck/head, jaw or voice tremor. Neck is supple with full range of passive and active motion. There are no carotid bruits on auscultation. No vertiginous Sx on quick changes in head positions. Oropharynx exam reveals: mild mouth dryness, adequate dental hygiene and mild airway crowding. Mallampati is class II. Tongue protrudes centrally and palate elevates symmetrically.  Chest: Clear to auscultation without wheezing, rhonchi or crackles noted.  Heart: S1+S2+0, regular and normal without murmurs, rubs or gallops noted.   Abdomen: Soft, non-tender and non-distended with normal bowel sounds appreciated on auscultation.  Extremities: There is no pitting edema in the distal lower extremities bilaterally. Pedal pulses are intact.  Skin: Warm and dry without trophic changes noted. There are no varicose veins.  Musculoskeletal: exam reveals no obvious joint deformities, tenderness or joint swelling or erythema.   Neurologically:  Mental status: The patient is awake, alert and oriented in all 4 spheres. Her memory, attention, language and knowledge are appropriate. There is no aphasia, agnosia, apraxia or anomia. Speech is clear with normal prosody and  enunciation. Thought process is linear. Mood is congruent and affect is normal.  Cranial nerves are as described above under HEENT exam. In addition, shoulder shrug is normal with equal shoulder height noted. Motor exam: Normal bulk, strength and tone is noted. There is no drift, tremor or rebound. Romberg is negative. Reflexes are 2+ throughout. Toes are downgoing bilaterally. Fine motor skills are intact with normal finger taps, normal hand movements, normal rapid alternating patting, normal foot taps and normal foot agility.  Cerebellar testing shows no dysmetria or intention tremor on finger to nose testing. Heel to shin is unremarkable bilaterally. There is no truncal or gait ataxia.  Sensory exam is intact to light touch, pinprick, vibration, temperature sense and proprioception in the upper and lower extremities.  Gait, station and balance: No veering to one side is noted. No leaning to one side is noted. Posture is age-appropriate and stance is slightly wide based. No problems turning are noted. She turns in 3 steps. Tandem walk is difficult for her. She walks cautiously.  Mild orthostatic lightheadedness is reported, no vertigo, no orthostatic hypotension.  Assessment and Plan:   In summary, NAN MAYA is a very pleasant 79 y.o.-year old female with a history of dizziness for about 2 months. She had syncope and collapse and possible convulsive syncope prior to her PM placement. Her physical exam is stable and non-focal at this time. Dx includes OH, adjustment to recent PM placement, dehydration, less likely Sz, carotid artery stenosis. She is doing fairly well at this time and I reassured the patient in that regard.  I had a long chat with the patient and her daughter about my findings and the diagnosis of syncope and collapse, the prognosis and treatment options. We talked about medical treatments and non-pharmacological approaches. We talked about maintaining a healthy  lifestyle in general. I encouraged the patient to eat healthy, exercise daily and keep well hydrated, to keep a scheduled bedtime and wake time routine, to not skip any meals and eat healthy snacks in between meals and to have protein with every meal. She is advised to change position slowly and use a cane for safety. She is advised to stay well hydrated at all times.  As far as further diagnostic testing is concerned, I suggested the following today: CT brain w/o contrast, carotid doppler study and EEG. However, the patient would like to hold off and wait it out some longer and think about theses tests. She is advised to call us back if she decides to pursue further testing.   As far as medications are concerned, I recommended the following at this time: no new medication; avoid sedating medications.  I answered all their questions today and the patient and her daughter were in agreement with the above outlined plan. I would like to see the patient back as needed.   Thank you very much for allowing me to participate in the care of this nice patient. If I can be of any further assistance to you please do not hesitate to call me at [phone].  Sincerely,   Huston Foley, MD, PhD

## 2013-06-22 ENCOUNTER — Other Ambulatory Visit: Payer: Self-pay

## 2013-06-27 ENCOUNTER — Telehealth: Payer: Self-pay | Admitting: Cardiovascular Disease

## 2013-06-27 NOTE — Telephone Encounter (Signed)
Please advise 

## 2013-06-27 NOTE — Telephone Encounter (Signed)
Was here in July for dizziness and they checked her pacemaker at that time-She wants to know if this appt on Wednesday necessary?

## 2013-06-28 LAB — PACEMAKER DEVICE OBSERVATION
BAMS-0001: 150 {beats}/min
BAMS-0003: 70 {beats}/min
BATTERY VOLTAGE: 3.05 V
VENTRICULAR PACING PM: 88

## 2013-06-28 NOTE — Telephone Encounter (Signed)
Spoke to pt regarding the appointment she has scheduled for tomorrow. I explained to pt that per Luke's last note his appointment did not replace Dr.Croitoru's appointment, and after further review of the last ck in office it appeared that her atrial output was not turned down during that session. Patient voiced her understanding, and will f/u with Dr.Croitoru as scheduled.

## 2013-06-29 ENCOUNTER — Ambulatory Visit (INDEPENDENT_AMBULATORY_CARE_PROVIDER_SITE_OTHER): Payer: Medicare Other | Admitting: Cardiovascular Disease

## 2013-06-29 VITALS — BP 150/80 | HR 64 | Resp 16 | Ht 67.5 in | Wt 144.5 lb

## 2013-06-29 DIAGNOSIS — I498 Other specified cardiac arrhythmias: Secondary | ICD-10-CM

## 2013-06-29 DIAGNOSIS — R001 Bradycardia, unspecified: Secondary | ICD-10-CM

## 2013-06-29 DIAGNOSIS — R55 Syncope and collapse: Secondary | ICD-10-CM

## 2013-06-29 DIAGNOSIS — I441 Atrioventricular block, second degree: Secondary | ICD-10-CM

## 2013-06-29 DIAGNOSIS — I10 Essential (primary) hypertension: Secondary | ICD-10-CM

## 2013-06-29 DIAGNOSIS — Z950 Presence of cardiac pacemaker: Secondary | ICD-10-CM

## 2013-06-29 LAB — PACEMAKER DEVICE OBSERVATION
AL AMPLITUDE: 2.4 mv
AL THRESHOLD: 1 V
BAMS-0001: 150 {beats}/min
BATTERY VOLTAGE: 3.04 V
DEVICE MODEL PM: 7458795
RV LEAD AMPLITUDE: 12 mv
RV LEAD THRESHOLD: 0.75 V

## 2013-06-29 NOTE — Progress Notes (Signed)
In office pacemaker interrogation. Normal device function. Minimal changes made this session. 

## 2013-06-29 NOTE — Patient Instructions (Addendum)
Remote monitoring is used to monitor your Pacemaker of ICD from home. This monitoring reduces the number of office visits required to check your device to one time per year. It allows [place] to keep an eye on the functioning of your device to ensure it is working properly. You are scheduled for a device check from home on 10-03-2013. You may send your transmission at any time that day. If you have a wireless device, the transmission will be sent automatically. After your physician reviews your transmission, you will receive a postcard within 2 weeks with your next transmission date.  Your Merlin monitor has not yet sent a successful transmission. Please go home and push the button on the front of the monitor twice as you sit by the machine throughout the transmission. After this one brief manual transmission your monitor will be able to send automatic transmissions every 3 months, or whenever there is an alert. Please call [phone] to make sure the transmission is received.    Your physician has requested that you regularly monitor and record your blood pressure readings at home. Please use the same machine at the same time of day to check your readings and record them to bring to your follow-up visit or please fax them in to [phone].   Your physician recommends that you schedule a follow-up appointment in: 12 months

## 2013-07-12 ENCOUNTER — Encounter: Payer: Self-pay | Admitting: Cardiovascular Disease

## 2013-07-12 NOTE — Assessment & Plan Note (Signed)
Normal device function. The diapers reduce her chronic levels. We can use a device to monitor for tachyarrhythmia to explain her symptoms, but so far there does not appear to be any connection between cardiac rhythm and her complaints

## 2013-07-12 NOTE — Assessment & Plan Note (Signed)
This appears to have been secondary to high-grade atrioventricular block and no episodes have recurred since pacemaker implantation

## 2013-07-12 NOTE — Progress Notes (Signed)
Patient ID: Kelly Neal, female   DOB: 1928/08/04, 79 y.o.   MRN: [id number]      Reason for office visit Pacemaker followup  Mrs. Cauthon is now roughly 6 weeks status post implantation of a dual-chamber permanent pacemaker for second-degree AV block Mobitz type I with symptomatic bradycardia/syncope. Overall she feels well. She complains of unusual sensation of flushing and electricity in her face, but denies dizziness, lightheadedness, syncope, shortness of breath or chest pain.  A pacemaker interrogation was performed today in the office and shows normal device function. There is almost 90% ventricular pacing and rare atrial pacing. No tachyarrhythmia is noted. Battery and lead parameters are all normal. There are no significant changes since her last check in July. No permanent changes were made to device settings today. She has normal left ventricular systolic function by echo. There is no evidence of any significant structural cardiac abnormalities, other than moderate left atrial dilatation.  Mrs. Grater was recently evaluated by a neurologist in generally the findings were reassuring. There was a recommendation for possible CT of the head and carotid duplex ultrasonography the patient did not proceed with these.   No Known Allergies  Current Outpatient Prescriptions  Medication Sig Dispense Refill  . carvedilol (COREG) 3.125 MG tablet Take 1 tablet (3.125 mg total) by mouth 2 (two) times daily with a meal.      . glimepiride (AMARYL) 2 MG tablet Take 1 tablet (2 mg total) by mouth daily before breakfast.  90 tablet  2  . lisinopril-hydrochlorothiazide (PRINZIDE,ZESTORETIC) 10-12.5 MG per tablet Take 0.5 tablets by mouth 2 (two) times daily.      . metFORMIN (GLUCOPHAGE-XR) 500 MG 24 hr tablet 500 mg. Take 1/2 tablet in the morning, 1 tablet in the evening      . pravastatin (PRAVACHOL) 20 MG tablet Take 1 tablet (20 mg total) by mouth daily.  90 tablet  2   No current  facility-administered medications for this visit.    Past Medical History  Diagnosis Date  . Hyperlipidemia   . Hypertension   . DM type 2 (diabetes mellitus, type 2)     Past Surgical History  Procedure Laterality Date  . Cataract extraction      Family History  Problem Relation Age of Onset  . Hypertension Mother   . Heart attack Father     History   Social History  . Marital Status: Married    Spouse Name: N/A    Number of Children: N/A  . Years of Education: N/A   Occupational History  . Not on file.   Social History Main Topics  . Smoking status: Former Smoker -- 2 years    Types: Cigarettes    Quit date: 11/17/1950  . Smokeless tobacco: Not on file  . Alcohol Use: No  . Drug Use: No  . Sexual Activity: No   Other Topics Concern  . Not on file   Social History Narrative  . No narrative on file    Review of systems: The patient specifically denies any chest pain at rest or with exertion, dyspnea at rest or with exertion, orthopnea, paroxysmal nocturnal dyspnea, syncope, palpitations, focal neurological deficits, intermittent claudication, lower extremity edema, unexplained weight gain, cough, hemoptysis or wheezing.  The patient also denies abdominal pain, nausea, vomiting, dysphagia, diarrhea, constipation, polyuria, polydipsia, dysuria, hematuria, frequency, urgency, abnormal bleeding or bruising, fever, chills, unexpected weight changes, mood swings, change in skin or hair texture, change in voice quality, auditory or visual  problems, allergic reactions or rashes, new musculoskeletal complaints other than usual "aches and pains".   PHYSICAL EXAM BP 150/80  Pulse 64  Resp 16  Ht 5' 7.5" (1.715 m)  Wt 144 lb 8 oz (65.545 kg)  BMI 22.28 kg/m2  General: Alert, oriented x3, no distress Head: no evidence of trauma, PERRL, EOMI, no exophtalmos or lid lag, no myxedema, no xanthelasma; normal ears, nose and oropharynx Neck: normal jugular venous  pulsations and no hepatojugular reflux; brisk carotid pulses without delay and no carotid bruits Chest: clear to auscultation, no signs of consolidation by percussion or palpation, normal fremitus, symmetrical and full respiratory excursions, left subclavian pacemaker site appears healthy Cardiovascular: normal position and quality of the apical impulse, regular rhythm, normal first and second heart sounds, no murmurs, rubs or gallops Abdomen: no tenderness or distention, no masses by palpation, no abnormal pulsatility or arterial bruits, normal bowel sounds, no hepatosplenomegaly Extremities: no clubbing, cyanosis or edema; 2+ radial, ulnar and brachial pulses bilaterally; 2+ right femoral, posterior tibial and dorsalis pedis pulses; 2+ left femoral, posterior tibial and dorsalis pedis pulses; no subclavian or femoral bruits Neurological: grossly nonfocal   EKG: Atrial sensed ventricular paced  Lipid Panel     Component Value Date/Time   CHOL 174 02/16/2012 1055   TRIG 141 02/16/2012 1055   HDL 51 02/16/2012 1055   CHOLHDL 3.4 02/16/2012 1055   VLDL 28 02/16/2012 1055   LDLCALC 95 02/16/2012 1055    BMET    Component Value Date/Time   NA 136 05/25/2013 1005   K 5.0 05/25/2013 1005   CL 101 05/25/2013 1005   CO2 26 05/25/2013 1005   GLUCOSE 136* 05/25/2013 1005   BUN 39* 05/25/2013 1005   CREATININE 1.29* 05/25/2013 1005   CREATININE 0.92 04/19/2013 1957   CALCIUM 9.5 05/25/2013 1005   GFRNONAA 56* 04/19/2013 1957   GFRAA 65* 04/19/2013 1957     ASSESSMENT AND PLAN Cardiac pacemaker in situ-St Jude implanted 04/20/13 Normal device function. The diapers reduce her chronic levels. We can use a device to monitor for tachyarrhythmia to explain her symptoms, but so far there does not appear to be any connection between cardiac rhythm and her complaints  Syncope This appears to have been secondary to high-grade atrioventricular block and no episodes have recurred since pacemaker implantation  Hypertension BP was  borderline elevated today. Since there is a suspicion that orthostatic hypotension may be causing her complaints no adjustments were made to the medications   Orders Placed This Encounter  Procedures  . Pacemaker Device Observation   No orders of the defined types were placed in this encounter.    Junious Silk, MD, [facility] [phone] office [phone] pager

## 2013-07-12 NOTE — Assessment & Plan Note (Signed)
BP was borderline elevated today. Since there is a suspicion that orthostatic hypotension may be causing her complaints no adjustments were made to the medications

## 2013-07-21 ENCOUNTER — Telehealth: Payer: Self-pay

## 2013-07-21 NOTE — Telephone Encounter (Signed)
Pt is wanting Dr Patsy Lager to give her a call back because she has some questions about her blood sugar and some other things that go along with that Call back number is [phone]

## 2013-07-22 ENCOUNTER — Ambulatory Visit (INDEPENDENT_AMBULATORY_CARE_PROVIDER_SITE_OTHER): Payer: Medicare Other | Admitting: Family Medicine

## 2013-07-22 VITALS — BP 142/80 | HR 66 | Temp 97.7°F | Resp 18 | Ht 66.0 in | Wt 139.0 lb

## 2013-07-22 DIAGNOSIS — R42 Dizziness and giddiness: Secondary | ICD-10-CM

## 2013-07-22 DIAGNOSIS — E119 Type 2 diabetes mellitus without complications: Secondary | ICD-10-CM

## 2013-07-22 DIAGNOSIS — D649 Anemia, unspecified: Secondary | ICD-10-CM

## 2013-07-22 LAB — GLUCOSE, POCT (MANUAL RESULT ENTRY): POC Glucose: 83 mg/dl (ref 70–99)

## 2013-07-22 LAB — POCT URINALYSIS DIPSTICK
Bilirubin, UA: NEGATIVE
Blood, UA: NEGATIVE
Ketones, UA: NEGATIVE
Spec Grav, UA: 1.01
pH, UA: 5.5

## 2013-07-22 LAB — POCT CBC
Granulocyte percent: 70.3 %G (ref 37–80)
HCT, POC: 37.3 % — AB (ref 37.7–47.9)
POC Granulocyte: 5.9 (ref 2–6.9)
POC LYMPH PERCENT: 23.5 %L (ref 10–50)
RDW, POC: 13.2 %

## 2013-07-22 LAB — POCT UA - MICROSCOPIC ONLY
Casts, Ur, LPF, POC: NEGATIVE
Crystals, Ur, HPF, POC: NEGATIVE

## 2013-07-22 LAB — POCT GLYCOSYLATED HEMOGLOBIN (HGB A1C): Hemoglobin A1C: 6.6

## 2013-07-22 NOTE — Progress Notes (Addendum)
[facility] [address] [place] [address] [phone]  Date:  07/22/2013   Name:  KIP KAUTZMAN   DOB:  01-Mar-1928   MRN:  [id number]  PCP:  Abbe Amsterdam, MD    Chief Complaint: Dizziness and Medication Problem   History of Present Illness:  Kelly Neal is a 79 y.o. very pleasant female patient who presents with the following:  Seen here in June with dizziness and syncope- found to have bradycardia.  She had a pacemaker placed.   She has been concerned that her glucose is running somewhat higher than usual, up to 130- 140- not sure if she needs to adjust her medicatoin She takes carvediolol 3.125 BID and is also taking lisinopirl/ hctz BID.  Wonders if she should stop some of her BP medication as well  She has a list of her recent BP and pulse readings.  She is running 106- 148/ 59- 73, pulse in the 60s.    She also notes concern about a strange sensation in her face which she has noted for a couple of months (since after her pacemaker was placed).  She feels the sensation on both sides of her face.  Pressure on the area feels good.  She feels fine when she lays down at night.  It is not a tinging or a numbness- she is not really able to describe how her face feels.   She has see cardiology and neurology about this, and no one was able to find anything amiss.  She wondered if this might be realted to her DM.    She last saw her eye doctor about one year ago.  She does have complaint of increased sensitivity to light for the last week or so.  She does not have any eye pain or vision change.    Patient Active Problem List   Diagnosis Date Noted  . Dizzy spells 05/26/2013  . Cardiac pacemaker in situ-St Jude implanted 04/20/13 04/21/2013  . Second degree atrioventricular block, Mobitz (type) I 04/19/2013  . Syncope 04/19/2013  . Symptomatic bradycardia 04/19/2013  . Hyperlipidemia   . Hypertension   . DM type 2 (diabetes mellitus, type 2)      Past Medical History  Diagnosis Date  . Hyperlipidemia   . Hypertension   . DM type 2 (diabetes mellitus, type 2)     Past Surgical History  Procedure Laterality Date  . Cataract extraction      History  Substance Use Topics  . Smoking status: Former Smoker -- 2 years    Types: Cigarettes    Quit date: 11/17/1950  . Smokeless tobacco: Not on file  . Alcohol Use: No    Family History  Problem Relation Age of Onset  . Hypertension Mother   . Heart attack Father     No Known Allergies  Medication list has been reviewed and updated.  Current Outpatient Prescriptions on File Prior to Visit  Medication Sig Dispense Refill  . carvedilol (COREG) 3.125 MG tablet Take 1 tablet (3.125 mg total) by mouth 2 (two) times daily with a meal.      . glimepiride (AMARYL) 2 MG tablet Take 1 tablet (2 mg total) by mouth daily before breakfast.  90 tablet  2  . lisinopril-hydrochlorothiazide (PRINZIDE,ZESTORETIC) 10-12.5 MG per tablet Take 0.5 tablets by mouth 2 (two) times daily.      . metFORMIN (GLUCOPHAGE-XR) 500 MG 24 hr tablet 500 mg. Take 1/2 tablet in the morning,  1 tablet in the evening      . pravastatin (PRAVACHOL) 20 MG tablet Take 1 tablet (20 mg total) by mouth daily.  90 tablet  2   No current facility-administered medications on file prior to visit.    Review of Systems:  As per HPI- otherwise negative.   Physical Examination: Filed Vitals:   07/22/13 1600  BP: 142/80  Pulse: 66  Temp: 97.7 F (36.5 C)  Resp: 18   Filed Vitals:   07/22/13 1600  Height: 5\' 6"  (1.676 m)  Weight: 139 lb (63.05 kg)   Body mass index is 22.45 kg/(m^2). Ideal Body Weight: Weight in (lb) to have BMI = 25: 154.6  GEN: WDWN, NAD, Non-toxic, A & O x 3, appears well and spry for age.  HEENT: Atraumatic, Normocephalic. Neck supple. No masses, No LAD.  Bilateral TM wnl, oropharynx normal.  PEERL,EOMI.   Fundoscopic exam wnl Ears and Nose: No external deformity. CV: RRR, No  M/G/R. No JVD. No thrill. No extra heart sounds. PULM: CTA B, no wheezes, crackles, rhonchi. No retractions. No resp. distress. No accessory muscle use. ABD: S, NT, ND. No rebound. No HSM. EXTR: No c/c/e NEURO Normal gait. Normal movement of facial muscles, normal movement of all extremities PSYCH: Normally interactive. Conversant. Not depressed or anxious appearing.  Calm demeanor.    Results for orders placed in visit on 07/22/13  POCT CBC      Result Value Range   WBC 8.4  4.6 - 10.2 K/uL   Lymph, poc 2.0  0.6 - 3.4   POC LYMPH PERCENT 23.5  10 - 50 %L   MID (cbc) 0.5  0 - 0.9   POC MID % 6.2  0 - 12 %M   POC Granulocyte 5.9  2 - 6.9   Granulocyte percent 70.3  37 - 80 %G   RBC 3.95 (*) 4.04 - 5.48 M/uL   Hemoglobin 11.8 (*) 12.2 - 16.2 g/dL   HCT, POC 40.9 (*) 81.1 - 47.9 %   MCV 94.5  80 - 97 fL   MCH, POC 29.9  27 - 31.2 pg   MCHC 31.6 (*) 31.8 - 35.4 g/dL   RDW, POC 91.4     Platelet Count, POC 251  142 - 424 K/uL   MPV 8.4  0 - 99.8 fL  POCT GLYCOSYLATED HEMOGLOBIN (HGB A1C)      Result Value Range   Hemoglobin A1C 6.6    GLUCOSE, POCT (MANUAL RESULT ENTRY)      Result Value Range   POC Glucose 83  70 - 99 mg/dl  POCT URINALYSIS DIPSTICK      Result Value Range   Color, UA yellow     Clarity, UA clear     Glucose, UA neg     Bilirubin, UA neg     Ketones, UA neg     Spec Grav, UA 1.010     Blood, UA neg     pH, UA 5.5     Protein, UA neg     Urobilinogen, UA 0.2     Nitrite, UA neg     Leukocytes, UA Trace    POCT UA - MICROSCOPIC ONLY      Result Value Range   WBC, Ur, HPF, POC 1-4     RBC, urine, microscopic 0-1     Bacteria, U Microscopic trace     Mucus, UA small     Epithelial cells, urine per micros 0-6  Crystals, Ur, HPF, POC neg     Casts, Ur, LPF, POC neg     Yeast, UA neg       Assessment and Plan: Type II or unspecified type diabetes mellitus without mention of complication, not stated as uncontrolled - Plan: POCT glycosylated  hemoglobin (Hb A1C), POCT glucose (manual entry)  Dizziness and giddiness - Plan: POCT CBC, Comprehensive metabolic panel, POCT urinalysis dipstick, POCT UA - Microscopic Only   Discussed with pt and her daughter in detail Dm: A1c is acceptable.  May need to d/c metformin depending on her renal function.  In that case consider increasing her dosage of glimepiride.  Will call her and send meds to her optimum rx as needed Dizziness:  May be due to low BP.  Per her BP readings her pressure is trending lower lately. Will message her cardiologist regarding her carvedilol.  Strange facial sensation; I do not know what is causing this.  However, it seems likely that there is nothing dangerous causing her sx.   Photosensitivity.  Diabetic, no eye exam in awhile.  Will call Monday and arrange for her to see optho asap  Follow-up anemia with the rest of her labs.  Signed Abbe Amsterdam, MD  9/6- called and LMOM.  I did talk with her cardiologist and she can stop the carvedilol.  She may continue her metformin, but we should not increase her dose

## 2013-07-22 NOTE — Telephone Encounter (Signed)
What are her questions? She indicates she wants to know if you can increase her metformin has been 130-150 range. She states she is dizzy and having a strange sensation with her face. I have advised her this is VERY concerning and she should come in for this, patient indicates she has seen Cardiology and Neurology concerning this but every thing has checked out okay. The dizziness is present, but the facial symptoms are worsening. Please advise. Patient advised I am concerned increasing the Metformin may increase the dizziness. Please advise.

## 2013-07-22 NOTE — Telephone Encounter (Signed)
Called to discuss with her.  Her glucose has been higher than usual lately.  She has seen neurology and cardiology recently due to sx in her face; she has noted a "pins and needles" feeling in her face for a few months.   We do not have a recent A1c. She would like to possibly increase her metformin.  Her glucose has been 130 to 150; normally it had been 120.  She will come and see me today for a recheck, need to eval her kidneys prior to considering any increase of her metformin.

## 2013-07-22 NOTE — Patient Instructions (Addendum)
I will be in touch with the rest of your labs.  For the time being continue to take the carvedilol as directed- I will ask your heart doctor if we can stop this medication.    I will work on getting you in to see opthalmology on monday

## 2013-07-23 LAB — COMPREHENSIVE METABOLIC PANEL
ALT: 11 U/L (ref 0–35)
AST: 15 U/L (ref 0–37)
Calcium: 9.3 mg/dL (ref 8.4–10.5)
Chloride: 101 mEq/L (ref 96–112)
Creat: 1.22 mg/dL — ABNORMAL HIGH (ref 0.50–1.10)
Total Bilirubin: 0.9 mg/dL (ref 0.3–1.2)

## 2013-07-23 NOTE — Addendum Note (Signed)
Addended by: Abbe Amsterdam C on: 07/23/2013 06:05 PM   Modules accepted: Orders, Medications

## 2013-07-24 ENCOUNTER — Encounter: Payer: Self-pay | Admitting: Family Medicine

## 2013-07-24 NOTE — Addendum Note (Signed)
Addended by: Abbe Amsterdam C on: 07/24/2013 03:51 PM   Modules accepted: Orders

## 2013-07-26 ENCOUNTER — Telehealth: Payer: Self-pay | Admitting: *Deleted

## 2013-07-26 NOTE — Telephone Encounter (Signed)
Notified BP readings sent in are excellent.  Continue current meds.  States PCP took her off Carvedilol last week.  Instructed to continue to monitor BP's and let us know if reading go up.  Voiced understanding.

## 2013-07-28 ENCOUNTER — Telehealth: Payer: Self-pay

## 2013-07-28 NOTE — Telephone Encounter (Signed)
Patient is at Dr. Lucious Groves office and requesting last ov notes from Dr. Patsy Lager  (everything pertaining to eye care.)  We referred her over.   [phone] fax   [place]  [phone]

## 2013-08-31 ENCOUNTER — Encounter: Payer: Self-pay | Admitting: Cardiovascular Disease

## 2013-09-22 ENCOUNTER — Other Ambulatory Visit: Payer: Self-pay

## 2013-10-03 ENCOUNTER — Telehealth: Payer: Self-pay | Admitting: Cardiovascular Disease

## 2013-10-03 ENCOUNTER — Ambulatory Visit (INDEPENDENT_AMBULATORY_CARE_PROVIDER_SITE_OTHER): Payer: Medicare Other | Admitting: *Deleted

## 2013-10-03 DIAGNOSIS — I498 Other specified cardiac arrhythmias: Secondary | ICD-10-CM

## 2013-10-03 DIAGNOSIS — R001 Bradycardia, unspecified: Secondary | ICD-10-CM

## 2013-10-03 DIAGNOSIS — I441 Atrioventricular block, second degree: Secondary | ICD-10-CM

## 2013-10-03 NOTE — Telephone Encounter (Signed)
New Problem  Pt received a letter stating that she had an appt for a remote pacer check/// she request a call back to discuss how to send the signal// helped her find the 800# on the back of the box// pt is still not sure how to send the message// please call back to assist.

## 2013-10-03 NOTE — Telephone Encounter (Signed)
Transmission received patient aware. 

## 2013-10-06 ENCOUNTER — Encounter: Payer: Self-pay | Admitting: *Deleted

## 2013-11-11 LAB — MDC_IDC_ENUM_SESS_TYPE_REMOTE
Battery Voltage: 3.04 V
Brady Statistic RV Percent Paced: 94 %
Implantable Pulse Generator Model: 2210
Implantable Pulse Generator Serial Number: 7458795
Lead Channel Impedance Value: 600 Ohm
Lead Channel Pacing Threshold Pulse Width: 0.4 ms
Lead Channel Setting Pacing Amplitude: 2 V
Lead Channel Setting Pacing Pulse Width: 0.4 ms

## 2013-11-12 ENCOUNTER — Other Ambulatory Visit: Payer: Self-pay | Admitting: Family Medicine

## 2013-11-17 MED ORDER — LISINOPRIL-HYDROCHLOROTHIAZIDE 10-12.5 MG PO TABS: 0.5000 | ORAL_TABLET | Freq: Two times a day (BID) | ORAL | Status: DC

## 2013-11-17 MED ORDER — METFORMIN HCL ER 500 MG PO TB24: ORAL_TABLET | ORAL | Status: DC

## 2013-11-17 NOTE — Telephone Encounter (Signed)
sent 

## 2013-12-07 ENCOUNTER — Ambulatory Visit (INDEPENDENT_AMBULATORY_CARE_PROVIDER_SITE_OTHER): Payer: Medicare Other | Admitting: Podiatry

## 2013-12-07 ENCOUNTER — Encounter: Payer: Self-pay | Admitting: Podiatry

## 2013-12-07 VITALS — BP 107/80 | HR 80 | Resp 12 | Ht 67.0 in | Wt 140.0 lb

## 2013-12-07 DIAGNOSIS — M79609 Pain in unspecified limb: Secondary | ICD-10-CM

## 2013-12-07 DIAGNOSIS — B351 Tinea unguium: Secondary | ICD-10-CM

## 2013-12-07 NOTE — Progress Notes (Signed)
   Subjective:    Patient ID: Kelly Neal, female    DOB: December 30, 1927, 80 y.o.   MRN: [id number]  HPI Comments: '' TOENAILS ARE THICK AND SORE FOR ABOUT 3 YEARS.''   Patient presents with daughter    Review of Systems  Musculoskeletal: Positive for gait problem.  All other systems reviewed and are negative.       Objective:   Physical Exam  A pleasant orientated x3 white female  Vascular: The DP are 2/4 bilaterally. The right PT is 1/4 in the left PT is 2/4.  Neurological: Sensation to 10 g imonofilament wire is 10/10 bilaterally. Vibratory sensation diminished bilaterally.  Dermatological: Mild atrophic skin without any hair growth noted bilaterally. Hypertrophic, brittle, elongated toenails x10 noted  Musculoskeletal: No deformities noted. Patient walks with a cane       Assessment & Plan:   Assessment: Mild sensory neuropathy bilaterally Symptomatic onychomycoses x10  Plan nails x10 are debrided back without a bleeding. Reappoint at three-month intervals.

## 2013-12-07 NOTE — Patient Instructions (Signed)
Diabetes and Foot Care Diabetes may cause you to have problems because of poor blood supply (circulation) to your feet and legs. This may cause the skin on your feet to become thinner, break easier, and heal more slowly. Your skin may become dry, and the skin may peel and crack. You may also have nerve damage in your legs and feet causing decreased feeling in them. You may not notice minor injuries to your feet that could lead to infections or more serious problems. Taking care of your feet is one of the most important things you can do for yourself.  HOME CARE INSTRUCTIONS  Wear shoes at all times, even in the house. Do not go barefoot. Bare feet are easily injured.  Check your feet daily for blisters, cuts, and redness. If you cannot see the bottom of your feet, use a mirror or ask someone for help.  Wash your feet with warm water (do not use hot water) and mild soap. Then pat your feet and the areas between your toes until they are completely dry. Do not soak your feet as this can dry your skin.  Apply a moisturizing lotion or petroleum jelly (that does not contain alcohol and is unscented) to the skin on your feet and to dry, brittle toenails. Do not apply lotion between your toes.  Trim your toenails straight across. Do not dig under them or around the cuticle. File the edges of your nails with an emery board or nail file.  Do not cut corns or calluses or try to remove them with medicine.  Wear clean socks or stockings every day. Make sure they are not too tight. Do not wear knee-high stockings since they may decrease blood flow to your legs.  Wear shoes that fit properly and have enough cushioning. To break in new shoes, wear them for just a few hours a day. This prevents you from injuring your feet. Always look in your shoes before you put them on to be sure there are no objects inside.  Do not cross your legs. This may decrease the blood flow to your feet.  If you find a minor scrape,  cut, or break in the skin on your feet, keep it and the skin around it clean and dry. These areas may be cleansed with mild soap and water. Do not cleanse the area with peroxide, alcohol, or iodine.  When you remove an adhesive bandage, be sure not to damage the skin around it.  If you have a wound, look at it several times a day to make sure it is healing.  Do not use heating pads or hot water bottles. They may burn your skin. If you have lost feeling in your feet or legs, you may not know it is happening until it is too late.  Make sure your health care provider performs a complete foot exam at least annually or more often if you have foot problems. Report any cuts, sores, or bruises to your health care provider immediately. SEEK MEDICAL CARE IF:   You have an injury that is not healing.  You have cuts or breaks in the skin.  You have an ingrown nail.  You notice redness on your legs or feet.  You feel burning or tingling in your legs or feet.  You have pain or cramps in your legs and feet.  Your legs or feet are numb.  Your feet always feel cold. SEEK IMMEDIATE MEDICAL CARE IF:   There is increasing redness,   swelling, or pain in or around a wound.  There is a red line that goes up your leg.  Pus is coming from a wound.  You develop a fever or as directed by your health care provider.  You notice a bad smell coming from an ulcer or wound. Document Released: 10/31/2000 Document Revised: 07/06/2013 Document Reviewed: 04/12/2013 Exit[employer] Patient Information 2014 [employer].  

## 2014-01-03 ENCOUNTER — Ambulatory Visit (INDEPENDENT_AMBULATORY_CARE_PROVIDER_SITE_OTHER): Payer: Medicare Other | Admitting: *Deleted

## 2014-01-03 DIAGNOSIS — I441 Atrioventricular block, second degree: Secondary | ICD-10-CM

## 2014-01-04 LAB — MDC_IDC_ENUM_SESS_TYPE_REMOTE
Brady Statistic RA Percent Paced: 7 %
Implantable Pulse Generator Model: 2210
Lead Channel Sensing Intrinsic Amplitude: 1.7 mV
Lead Channel Setting Pacing Amplitude: 2 V
Lead Channel Setting Pacing Pulse Width: 0.4 ms
Lead Channel Setting Sensing Sensitivity: 2 mV
MDC IDC MSMT BATTERY REMAINING LONGEVITY: 127 mo
MDC IDC MSMT LEADCHNL RA IMPEDANCE VALUE: 490 Ohm
MDC IDC MSMT LEADCHNL RV PACING THRESHOLD AMPLITUDE: 0.75 V
MDC IDC MSMT LEADCHNL RV PACING THRESHOLD PULSEWIDTH: 0.4 ms
MDC IDC PG SERIAL: 7458795
MDC IDC SET LEADCHNL RV PACING AMPLITUDE: 1 V
MDC IDC STAT BRADY RV PERCENT PACED: 97 %

## 2014-01-16 ENCOUNTER — Encounter: Payer: Self-pay | Admitting: *Deleted

## 2014-01-19 ENCOUNTER — Encounter: Payer: Self-pay | Admitting: Cardiovascular Disease

## 2014-02-16 ENCOUNTER — Telehealth: Payer: Self-pay | Admitting: *Deleted

## 2014-02-16 NOTE — Telephone Encounter (Signed)
Returned call and pt verified x 2.  Pt informed per Tobin Chad, CMA, that she can have dental X-Rays.  Pt also wanted to know if she needed to take her transmitter w/ her when she goes on vacation.  Per Tobin Chad, if a short trip then not necessary.  If a long trip for months, then take transmitter.  Pt informed and verbalized understanding.

## 2014-02-16 NOTE — Telephone Encounter (Signed)
Pt was calling in regards to her pacemaker. She is going to the dentist and she wants to know about getting x-rays done.  Kelly Neal

## 2014-03-06 ENCOUNTER — Telehealth: Payer: Self-pay | Admitting: *Deleted

## 2014-03-06 NOTE — Telephone Encounter (Signed)
I returned her call.  I have an appointment with Dr. Amalia Hailey this week.  I read something about the laser.  I want to have that done but I need a little more information.  How much does it cost?  I informed her that insurance does not cover it, one nail is $295 and all ten is $795.  She asked if he would treat it the day she comes in.  I told her she would probably have to schedule another appointment for the laser.  He'll probably evaluate her nails and determine if it would be beneficial on initial visit.  She thanked me for the information.

## 2014-03-08 ENCOUNTER — Ambulatory Visit (INDEPENDENT_AMBULATORY_CARE_PROVIDER_SITE_OTHER): Payer: Medicare Other | Admitting: Podiatry

## 2014-03-08 ENCOUNTER — Encounter: Payer: Self-pay | Admitting: Podiatry

## 2014-03-08 VITALS — BP 151/94 | HR 83 | Resp 12

## 2014-03-08 DIAGNOSIS — B351 Tinea unguium: Secondary | ICD-10-CM

## 2014-03-08 DIAGNOSIS — M79609 Pain in unspecified limb: Secondary | ICD-10-CM

## 2014-03-09 NOTE — Progress Notes (Signed)
Patient ID: Kelly Neal, female   DOB: December 08, 1927, 80 y.o.   MRN: [id number]  Subjective: This patient presents today complaining of painful toenails.  Objective: Orientated x3 space female Elongated, hypertrophic, discolored toenails with palpable tenderness in all the toenails.  Assessment: Symptomatic onychomycoses x10  Plan: Nails x10 are debrided without any bleeding. Reappoint at three-month intervals

## 2014-04-06 ENCOUNTER — Ambulatory Visit (INDEPENDENT_AMBULATORY_CARE_PROVIDER_SITE_OTHER): Payer: Medicare Other | Admitting: *Deleted

## 2014-04-06 DIAGNOSIS — I441 Atrioventricular block, second degree: Secondary | ICD-10-CM

## 2014-04-06 NOTE — Progress Notes (Signed)
Remote pacemaker transmission.   

## 2014-04-16 LAB — MDC_IDC_ENUM_SESS_TYPE_REMOTE
Battery Remaining Longevity: 143 mo
Battery Remaining Percentage: 95.5 %
Brady Statistic AP VS Percent: 1 %
Brady Statistic AS VS Percent: 2 %
Implantable Pulse Generator Model: 2210
Lead Channel Impedance Value: 440 Ohm
Lead Channel Pacing Threshold Amplitude: 0.75 V
Lead Channel Pacing Threshold Pulse Width: 0.4 ms
Lead Channel Pacing Threshold Pulse Width: 0.4 ms
Lead Channel Sensing Intrinsic Amplitude: 11.7 mV
Lead Channel Setting Pacing Amplitude: 1 V
Lead Channel Setting Pacing Amplitude: 2 V
Lead Channel Setting Pacing Pulse Width: 0.4 ms
MDC IDC MSMT BATTERY VOLTAGE: 3.02 V
MDC IDC MSMT LEADCHNL RA PACING THRESHOLD AMPLITUDE: 1 V
MDC IDC MSMT LEADCHNL RA SENSING INTR AMPL: 1.5 mV
MDC IDC MSMT LEADCHNL RV IMPEDANCE VALUE: 550 Ohm
MDC IDC PG SERIAL: 7458795
MDC IDC SESS DTM: 20150521071158
MDC IDC SET LEADCHNL RV SENSING SENSITIVITY: 2 mV
MDC IDC STAT BRADY AP VP PERCENT: 6.4 %
MDC IDC STAT BRADY AS VP PERCENT: 92 %
MDC IDC STAT BRADY RA PERCENT PACED: 6.4 %
MDC IDC STAT BRADY RV PERCENT PACED: 98 %

## 2014-04-21 ENCOUNTER — Encounter: Payer: Self-pay | Admitting: Cardiology

## 2014-04-24 ENCOUNTER — Encounter: Payer: Self-pay | Admitting: Cardiology

## 2014-04-26 ENCOUNTER — Encounter: Payer: Self-pay | Admitting: Cardiovascular Disease

## 2014-06-07 ENCOUNTER — Ambulatory Visit: Payer: Medicare Other | Admitting: Podiatry

## 2014-07-11 ENCOUNTER — Encounter: Payer: Self-pay | Admitting: *Deleted

## 2014-07-27 ENCOUNTER — Encounter: Payer: Medicare Other | Admitting: Cardiovascular Disease

## 2014-09-05 ENCOUNTER — Encounter: Payer: Self-pay | Admitting: Cardiovascular Disease

## 2014-09-05 ENCOUNTER — Ambulatory Visit (INDEPENDENT_AMBULATORY_CARE_PROVIDER_SITE_OTHER): Payer: Medicare Other | Admitting: Cardiovascular Disease

## 2014-09-05 VITALS — BP 172/76 | HR 77 | Resp 16 | Ht 67.0 in | Wt 137.3 lb

## 2014-09-05 DIAGNOSIS — E785 Hyperlipidemia, unspecified: Secondary | ICD-10-CM

## 2014-09-05 DIAGNOSIS — R001 Bradycardia, unspecified: Secondary | ICD-10-CM

## 2014-09-05 DIAGNOSIS — I480 Paroxysmal atrial fibrillation: Secondary | ICD-10-CM

## 2014-09-05 DIAGNOSIS — I441 Atrioventricular block, second degree: Secondary | ICD-10-CM

## 2014-09-05 DIAGNOSIS — Z950 Presence of cardiac pacemaker: Secondary | ICD-10-CM

## 2014-09-05 DIAGNOSIS — I10 Essential (primary) hypertension: Secondary | ICD-10-CM

## 2014-09-05 LAB — MDC_IDC_ENUM_SESS_TYPE_INCLINIC
Battery Remaining Longevity: 145.2 mo
Battery Voltage: 3.02 V
Brady Statistic RV Percent Paced: 99 %
Date Time Interrogation Session: 20151020145959
Lead Channel Impedance Value: 450 Ohm
Lead Channel Pacing Threshold Amplitude: 0.75 V
Lead Channel Pacing Threshold Pulse Width: 0.4 ms
Lead Channel Sensing Intrinsic Amplitude: 11.7 mV
Lead Channel Sensing Intrinsic Amplitude: 2.5 mV
Lead Channel Setting Pacing Amplitude: 1.125
Lead Channel Setting Sensing Sensitivity: 2 mV
MDC IDC MSMT LEADCHNL RA PACING THRESHOLD AMPLITUDE: 0.75 V
MDC IDC MSMT LEADCHNL RA PACING THRESHOLD PULSEWIDTH: 0.4 ms
MDC IDC MSMT LEADCHNL RV IMPEDANCE VALUE: 537.5 Ohm
MDC IDC MSMT LEADCHNL RV PACING THRESHOLD AMPLITUDE: 0.875 V
MDC IDC MSMT LEADCHNL RV PACING THRESHOLD PULSEWIDTH: 0.4 ms
MDC IDC PG SERIAL: 7458795
MDC IDC SET LEADCHNL RA PACING AMPLITUDE: 2 V
MDC IDC SET LEADCHNL RV PACING PULSEWIDTH: 0.4 ms
MDC IDC STAT BRADY RA PERCENT PACED: 6.9 %

## 2014-09-05 MED ORDER — ASPIRIN EC 81 MG PO TBEC: 81.0000 mg | DELAYED_RELEASE_TABLET | Freq: Every day | ORAL | Status: DC

## 2014-09-05 NOTE — Assessment & Plan Note (Addendum)
She has an elevated embolic risk (CHADSVasc2 score is at least 5) but also has elevated bleeding risk. The longest recorded episode of atrial fibrillation has been only 18 seconds in duration. I am loath to put her on potent anticoagulation therapy. We'll start aspirin for stroke prophylaxis. Her pacemaker is programmed to automatically transmits increased burden of atrial fibrillation. If the prevalence in duration of A. fib episodes increases or if she develops any neurological events, may have to change strategy to full anticoagulation. She is asked to call us immediately if she develops even the most transient focal neurological event.

## 2014-09-05 NOTE — Patient Instructions (Addendum)
START ASPIRIN 81MG  DAILY.  Remote monitoring is used to monitor your pacemaker from home. This monitoring reduces the number of office visits required to check your device to one time per year. It allows [place] to keep an eye on the functioning of your device to ensure it is working properly. You are scheduled for a device check from home on 12/07/2014. You may send your transmission at any time that day. If you have a wireless device, the transmission will be sent automatically. After your physician reviews your transmission, you will receive a postcard with your next transmission date.  Your physician recommends that you schedule a follow-up appointment in: 12 months with Dr.Croitoru

## 2014-09-05 NOTE — Assessment & Plan Note (Signed)
Normal device function. She seems to have progressed from second-degree atrioventricular block to complete heart block and should be considered pacemaker dependent. Remote monitoring every 3 months and office visit yearly.

## 2014-09-05 NOTE — Progress Notes (Signed)
Patient ID: Kelly Neal, female   DOB: 03-02-1928, 80 y.o.   MRN: [id number]     Reason for office visit Pacemaker followup   Mrs. Finau is now almost 18 months status post implantation of a dual-chamber permanent pacemaker for second-degree AV block Mobitz type I with symptomatic bradycardia/syncope. Overall she feels well. She complains of occasional mild shortness of breath, but denies syncope, palpitations, lower extremity edema or chest pain.  Pacemaker interrogation today shows that she is pacemaker dependent without any detectable R waves and 100% ventricular pacing. There is a roughly 7% atrial pacing. A new finding on device interrogation are very brief episodes of paroxysmal atrial fibrillation. Roughly 10 episodes have been recorded but the longest one was only 12 seconds in duration. The atrial electrogram is very erratic and rapid and highly consistent with atrial fibrillation. None of these episodes were symptomatic. Estimated generator longevity is around 11-12 years lead parameters are excellent. Significant comorbidities include diabetes mellitus, hypertension which elevated her risk of stroke. She also has moderate left atrial dilatation by echo but has normal attention and systolic function and no history of congestive heart failure, stroke/TIA. She has a very unsteady gait and is prone to falls. She is in a wheelchair today.    No Known Allergies  Current Outpatient Prescriptions  Medication Sig Dispense Refill  . glimepiride (AMARYL) 2 MG tablet Take 1 tablet (2 mg total) by mouth daily before breakfast.  90 tablet  2  . lisinopril-hydrochlorothiazide (PRINZIDE,ZESTORETIC) 10-12.5 MG per tablet Take 0.5 tablets by mouth 2 (two) times daily.  90 tablet  0  . metFORMIN (GLUCOPHAGE-XR) 500 MG 24 hr tablet Take 1/2 tablet in the morning, 1 tablet in the evening  135 tablet  0  . aspirin EC 81 MG tablet Take 1 tablet (81 mg total) by mouth daily.  90 tablet  3   No current  facility-administered medications for this visit.    Past Medical History  Diagnosis Date  . Hyperlipidemia   . Hypertension   . DM type 2 (diabetes mellitus, type 2)     Past Surgical History  Procedure Laterality Date  . Cataract extraction      Family History  Problem Relation Age of Onset  . Hypertension Mother   . Heart attack Father     History   Social History  . Marital Status: Married    Spouse Name: N/A    Number of Children: N/A  . Years of Education: N/A   Occupational History  . Not on file.   Social History Main Topics  . Smoking status: Former Smoker -- 2 years    Types: Cigarettes    Quit date: 11/17/1950  . Smokeless tobacco: Not on file  . Alcohol Use: No  . Drug Use: No  . Sexual Activity: No   Other Topics Concern  . Not on file   Social History Narrative  . No narrative on file    Review of systems: The patient specifically denies any chest pain at rest or with exertion, dyspnea at rest or with exertion, orthopnea, paroxysmal nocturnal dyspnea, syncope, palpitations, focal neurological deficits, intermittent claudication, lower extremity edema, unexplained weight gain, cough, hemoptysis or wheezing.  The patient also denies abdominal pain, nausea, vomiting, dysphagia, diarrhea, constipation, polyuria, polydipsia, dysuria, hematuria, frequency, urgency, abnormal bleeding or bruising, fever, chills, unexpected weight changes, mood swings, change in skin or hair texture, change in voice quality, auditory or visual problems, allergic reactions or rashes, new musculoskeletal  complaints other than usual "aches and pains".   PHYSICAL EXAM BP 172/76  Pulse 77  Resp 16  Ht 5\' 7"  (1.702 m)  Wt 62.279 kg (137 lb 4.8 oz)  BMI 21.50 kg/m2 Rechecked 142/66 mm H. General: Alert, oriented x3, no distress  Head: no evidence of trauma, PERRL, EOMI, no exophtalmos or lid lag, no myxedema, no xanthelasma; normal ears, nose and oropharynx  Neck: normal  jugular venous pulsations and no hepatojugular reflux; brisk carotid pulses without delay and no carotid bruits  Chest: clear to auscultation, no signs of consolidation by percussion or palpation, normal fremitus, symmetrical and full respiratory excursions, left subclavian pacemaker site appears healthy  Cardiovascular: normal position and quality of the apical impulse, regular rhythm, normal first and second heart sounds, no murmurs, rubs or gallops  Abdomen: no tenderness or distention, no masses by palpation, no abnormal pulsatility or arterial bruits, normal bowel sounds, no hepatosplenomegaly  Extremities: no clubbing, cyanosis or edema; 2+ radial, ulnar and brachial pulses bilaterally; 2+ right femoral, posterior tibial and dorsalis pedis pulses; 2+ left femoral, posterior tibial and dorsalis pedis pulses; no subclavian or femoral bruits  Neurological: grossly nonfocal   EKG: Atrial sensed, ventricular paced  Lipid Panel     Component Value Date/Time   CHOL 174 02/16/2012 1055   TRIG 141 02/16/2012 1055   HDL 51 02/16/2012 1055   CHOLHDL 3.4 02/16/2012 1055   VLDL 28 02/16/2012 1055   LDLCALC 95 02/16/2012 1055   LDLDIRECT 115* 01/06/2013 1236    BMET    Component Value Date/Time   NA 136 07/22/2013 1735   K 4.6 07/22/2013 1735   CL 101 07/22/2013 1735   CO2 26 07/22/2013 1735   GLUCOSE 79 07/22/2013 1735   BUN 30* 07/22/2013 1735   CREATININE 1.22* 07/22/2013 1735   CREATININE 0.92 04/19/2013 1957   CALCIUM 9.3 07/22/2013 1735   GFRNONAA 56* 04/19/2013 1957   GFRAA 65* 04/19/2013 1957     ASSESSMENT AND PLAN Paroxysmal atrial fibrillation She has an elevated embolic risk (CHADSVasc2 score is at least 5) but also has elevated bleeding risk. The longest recorded episode of atrial fibrillation has been only 18 seconds in duration. I am loath to put her on potent anticoagulation therapy. We'll start aspirin for stroke prophylaxis. Her pacemaker is programmed to automatically transmits increased burden of  atrial fibrillation. If the prevalence and duration of A. fib episodes increases or if she develops any neurological events, may have to change strategy to full anticoagulation. She is asked to call us immediately if she develops even the most transient focal neurological event.  Cardiac pacemaker in situ-[employer] implanted 04/20/13 Normal device function. She seems to have progressed from second-degree atrioventricular block to complete heart block and should be considered pacemaker dependent. Remote monitoring every 3 months and office visit yearly.  Hypertension Borderline elevation blood pressure today. No changes made in medications.   No orders of the defined types were placed in this encounter.   Meds ordered this encounter  Medications  . aspirin EC 81 MG tablet    Sig: Take 1 tablet (81 mg total) by mouth daily.    Dispense:  90 tablet    Refill:  [place] Donn Wilmot, MD, [facility] [phone] office [phone] pager

## 2014-09-05 NOTE — Assessment & Plan Note (Signed)
Borderline elevation blood pressure today. No changes made in medications.

## 2014-09-18 ENCOUNTER — Encounter: Payer: Self-pay | Admitting: Cardiovascular Disease

## 2014-09-18 ENCOUNTER — Encounter: Payer: Self-pay | Admitting: Podiatry

## 2014-09-18 ENCOUNTER — Ambulatory Visit (INDEPENDENT_AMBULATORY_CARE_PROVIDER_SITE_OTHER): Payer: Medicare Other | Admitting: Podiatry

## 2014-09-18 DIAGNOSIS — B351 Tinea unguium: Secondary | ICD-10-CM

## 2014-09-18 DIAGNOSIS — M79676 Pain in unspecified toe(s): Secondary | ICD-10-CM

## 2014-09-18 NOTE — Patient Instructions (Signed)
Diabetes and Foot Care Diabetes may cause you to have problems because of poor blood supply (circulation) to your feet and legs. This may cause the skin on your feet to become thinner, break easier, and heal more slowly. Your skin may become dry, and the skin may peel and crack. You may also have nerve damage in your legs and feet causing decreased feeling in them. You may not notice minor injuries to your feet that could lead to infections or more serious problems. Taking care of your feet is one of the most important things you can do for yourself.  HOME CARE INSTRUCTIONS  Wear shoes at all times, even in the house. Do not go barefoot. Bare feet are easily injured.  Check your feet daily for blisters, cuts, and redness. If you cannot see the bottom of your feet, use a mirror or ask someone for help.  Wash your feet with warm water (do not use hot water) and mild soap. Then pat your feet and the areas between your toes until they are completely dry. Do not soak your feet as this can dry your skin.  Apply a moisturizing lotion or petroleum jelly (that does not contain alcohol and is unscented) to the skin on your feet and to dry, brittle toenails. Do not apply lotion between your toes.  Trim your toenails straight across. Do not dig under them or around the cuticle. File the edges of your nails with an emery board or nail file.  Do not cut corns or calluses or try to remove them with medicine.  Wear clean socks or stockings every day. Make sure they are not too tight. Do not wear knee-high stockings since they may decrease blood flow to your legs.  Wear shoes that fit properly and have enough cushioning. To break in new shoes, wear them for just a few hours a day. This prevents you from injuring your feet. Always look in your shoes before you put them on to be sure there are no objects inside.  Do not cross your legs. This may decrease the blood flow to your feet.  If you find a minor scrape,  cut, or break in the skin on your feet, keep it and the skin around it clean and dry. These areas may be cleansed with mild soap and water. Do not cleanse the area with peroxide, alcohol, or iodine.  When you remove an adhesive bandage, be sure not to damage the skin around it.  If you have a wound, look at it several times a day to make sure it is healing.  Do not use heating pads or hot water bottles. They may burn your skin. If you have lost feeling in your feet or legs, you may not know it is happening until it is too late.  Make sure your health care provider performs a complete foot exam at least annually or more often if you have foot problems. Report any cuts, sores, or bruises to your health care provider immediately. SEEK MEDICAL CARE IF:   You have an injury that is not healing.  You have cuts or breaks in the skin.  You have an ingrown nail.  You notice redness on your legs or feet.  You feel burning or tingling in your legs or feet.  You have pain or cramps in your legs and feet.  Your legs or feet are numb.  Your feet always feel cold. SEEK IMMEDIATE MEDICAL CARE IF:   There is increasing redness,   swelling, or pain in or around a wound.  There is a red line that goes up your leg.  Pus is coming from a wound.  You develop a fever or as directed by your health care provider.  You notice a bad smell coming from an ulcer or wound. Document Released: 10/31/2000 Document Revised: 07/06/2013 Document Reviewed: 04/12/2013 ExitCare Patient Information 2015 [employer]. This information is not intended to replace advice given to you by your health care provider. Make sure you discuss any questions you have with your health care provider.  

## 2014-09-19 NOTE — Progress Notes (Signed)
Patient ID: Kelly Neal, female   DOB: [birth year], 80 y.o.   MRN: [id number]  Subjective: This patient presents again complaining of painful toenails  Objective: Hypertrophic, elongated, discolored toenails 6-10  Assessment: Symptomatic mycotic toenails 10  Plan: Nails 10 are debrided without any bleeding Reappoint 3 months

## 2014-10-26 ENCOUNTER — Encounter (HOSPITAL_COMMUNITY): Payer: Self-pay | Admitting: Internal Medicine

## 2014-10-31 ENCOUNTER — Telehealth: Payer: Self-pay | Admitting: Family Medicine

## 2014-10-31 NOTE — Telephone Encounter (Signed)
Left a message for patient to come by and get the flu shot or call and update the chart with the flu shot information.

## 2014-11-23 ENCOUNTER — Telehealth: Payer: Self-pay | Admitting: Cardiovascular Disease

## 2014-11-23 NOTE — Telephone Encounter (Signed)
Please call,question about her pacer transmittal box.

## 2014-11-24 NOTE — Telephone Encounter (Signed)
Spoke w/ pt and informed her that she can turn the monitor away from her so the light does not bother her while she is trying to sleep.

## 2014-12-07 ENCOUNTER — Ambulatory Visit (INDEPENDENT_AMBULATORY_CARE_PROVIDER_SITE_OTHER): Payer: Medicare Other | Admitting: *Deleted

## 2014-12-07 DIAGNOSIS — I480 Paroxysmal atrial fibrillation: Secondary | ICD-10-CM

## 2014-12-07 LAB — MDC_IDC_ENUM_SESS_TYPE_REMOTE
Battery Remaining Percentage: 95.5 %
Brady Statistic AP VP Percent: 6.3 %
Brady Statistic AP VS Percent: 1 %
Implantable Pulse Generator Serial Number: 7458795
Lead Channel Impedance Value: 440 Ohm
Lead Channel Impedance Value: 560 Ohm
Lead Channel Pacing Threshold Amplitude: 0.75 V
Lead Channel Pacing Threshold Pulse Width: 0.4 ms
Lead Channel Sensing Intrinsic Amplitude: 11.7 mV
Lead Channel Sensing Intrinsic Amplitude: 2 mV
Lead Channel Setting Pacing Pulse Width: 0.4 ms
Lead Channel Setting Sensing Sensitivity: 2 mV
MDC IDC MSMT BATTERY REMAINING LONGEVITY: 142 mo
MDC IDC MSMT BATTERY VOLTAGE: 3.02 V
MDC IDC MSMT LEADCHNL RA PACING THRESHOLD AMPLITUDE: 0.75 V
MDC IDC MSMT LEADCHNL RA PACING THRESHOLD PULSEWIDTH: 0.4 ms
MDC IDC SESS DTM: 20160121084224
MDC IDC SET LEADCHNL RA PACING AMPLITUDE: 2 V
MDC IDC SET LEADCHNL RV PACING AMPLITUDE: 1 V
MDC IDC STAT BRADY AS VP PERCENT: 94 %
MDC IDC STAT BRADY AS VS PERCENT: 1 %
MDC IDC STAT BRADY RA PERCENT PACED: 6.3 %
MDC IDC STAT BRADY RV PERCENT PACED: 99 %

## 2014-12-07 NOTE — Progress Notes (Signed)
Remote pacemaker transmission.   

## 2014-12-12 ENCOUNTER — Telehealth: Payer: Self-pay | Admitting: *Deleted

## 2014-12-12 NOTE — Telephone Encounter (Signed)
Spoke to patient about AF episode from 10-2014. I explained the episode to patient as well as the possibility of needing A/C in the future. Patient voiced understanding and appreciation for the call.

## 2014-12-14 ENCOUNTER — Encounter: Payer: Self-pay | Admitting: Cardiology

## 2014-12-20 ENCOUNTER — Encounter: Payer: Self-pay | Admitting: Cardiovascular Disease

## 2014-12-28 ENCOUNTER — Encounter: Payer: Self-pay | Admitting: Cardiology

## 2015-02-07 ENCOUNTER — Telehealth: Payer: Self-pay | Admitting: *Deleted

## 2015-02-07 NOTE — Telephone Encounter (Signed)
Daughter called stating that her mother is unable to come into the office for ppm checks. Since mother (Kelly Neal) does remotes we will continue to follow remotely Q52mos and with F/U w/ [facility] prn. Daughter voiced understanding.

## 2015-03-03 ENCOUNTER — Ambulatory Visit (INDEPENDENT_AMBULATORY_CARE_PROVIDER_SITE_OTHER): Payer: Medicare Other

## 2015-03-03 ENCOUNTER — Encounter (HOSPITAL_COMMUNITY): Payer: Self-pay | Admitting: Emergency Medicine

## 2015-03-03 ENCOUNTER — Ambulatory Visit (INDEPENDENT_AMBULATORY_CARE_PROVIDER_SITE_OTHER): Payer: Medicare Other | Admitting: Emergency Medicine

## 2015-03-03 ENCOUNTER — Inpatient Hospital Stay (HOSPITAL_COMMUNITY)
Admission: EM | Admit: 2015-03-03 | Discharge: 2015-03-07 | DRG: 292 | Disposition: A | Discharge status: Skilled Nursing Facility | Payer: Medicare Other | Attending: Family Medicine | Admitting: Family Medicine

## 2015-03-03 VITALS — BP 140/90 | HR 84 | Temp 97.8°F | Resp 16 | Ht 68.0 in | Wt 136.0 lb

## 2015-03-03 DIAGNOSIS — I5041 Acute combined systolic (congestive) and diastolic (congestive) heart failure: Secondary | ICD-10-CM

## 2015-03-03 DIAGNOSIS — N189 Chronic kidney disease, unspecified: Secondary | ICD-10-CM | POA: Diagnosis present

## 2015-03-03 DIAGNOSIS — R809 Proteinuria, unspecified: Secondary | ICD-10-CM | POA: Diagnosis present

## 2015-03-03 DIAGNOSIS — E1121 Type 2 diabetes mellitus with diabetic nephropathy: Secondary | ICD-10-CM | POA: Diagnosis present

## 2015-03-03 DIAGNOSIS — R42 Dizziness and giddiness: Secondary | ICD-10-CM | POA: Diagnosis not present

## 2015-03-03 DIAGNOSIS — I5043 Acute on chronic combined systolic (congestive) and diastolic (congestive) heart failure: Secondary | ICD-10-CM | POA: Diagnosis present

## 2015-03-03 DIAGNOSIS — F17210 Nicotine dependence, cigarettes, uncomplicated: Secondary | ICD-10-CM | POA: Diagnosis present

## 2015-03-03 DIAGNOSIS — I480 Paroxysmal atrial fibrillation: Secondary | ICD-10-CM

## 2015-03-03 DIAGNOSIS — R600 Localized edema: Secondary | ICD-10-CM | POA: Insufficient documentation

## 2015-03-03 DIAGNOSIS — R609 Edema, unspecified: Secondary | ICD-10-CM | POA: Insufficient documentation

## 2015-03-03 DIAGNOSIS — I5021 Acute systolic (congestive) heart failure: Secondary | ICD-10-CM | POA: Diagnosis not present

## 2015-03-03 DIAGNOSIS — Z7982 Long term (current) use of aspirin: Secondary | ICD-10-CM

## 2015-03-03 DIAGNOSIS — Z950 Presence of cardiac pacemaker: Secondary | ICD-10-CM | POA: Diagnosis not present

## 2015-03-03 DIAGNOSIS — I10 Essential (primary) hypertension: Secondary | ICD-10-CM | POA: Diagnosis present

## 2015-03-03 DIAGNOSIS — R296 Repeated falls: Secondary | ICD-10-CM | POA: Diagnosis present

## 2015-03-03 DIAGNOSIS — Z9849 Cataract extraction status, unspecified eye: Secondary | ICD-10-CM | POA: Diagnosis not present

## 2015-03-03 DIAGNOSIS — I5031 Acute diastolic (congestive) heart failure: Secondary | ICD-10-CM | POA: Diagnosis not present

## 2015-03-03 DIAGNOSIS — E785 Hyperlipidemia, unspecified: Secondary | ICD-10-CM | POA: Diagnosis present

## 2015-03-03 DIAGNOSIS — I5023 Acute on chronic systolic (congestive) heart failure: Secondary | ICD-10-CM | POA: Diagnosis not present

## 2015-03-03 DIAGNOSIS — N179 Acute kidney failure, unspecified: Secondary | ICD-10-CM | POA: Diagnosis present

## 2015-03-03 DIAGNOSIS — R7989 Other specified abnormal findings of blood chemistry: Secondary | ICD-10-CM | POA: Insufficient documentation

## 2015-03-03 DIAGNOSIS — R778 Other specified abnormalities of plasma proteins: Secondary | ICD-10-CM

## 2015-03-03 DIAGNOSIS — I129 Hypertensive chronic kidney disease with stage 1 through stage 4 chronic kidney disease, or unspecified chronic kidney disease: Secondary | ICD-10-CM | POA: Diagnosis present

## 2015-03-03 DIAGNOSIS — I5042 Chronic combined systolic (congestive) and diastolic (congestive) heart failure: Secondary | ICD-10-CM

## 2015-03-03 DIAGNOSIS — I509 Heart failure, unspecified: Secondary | ICD-10-CM | POA: Diagnosis not present

## 2015-03-03 LAB — BASIC METABOLIC PANEL
Anion gap: 11 (ref 5–15)
BUN: 24 mg/dL — ABNORMAL HIGH (ref 6–23)
CHLORIDE: 97 mmol/L (ref 96–112)
CO2: 26 mmol/L (ref 19–32)
Calcium: 9.2 mg/dL (ref 8.4–10.5)
Creatinine, Ser: 1.19 mg/dL — ABNORMAL HIGH (ref 0.50–1.10)
GFR calc Af Amer: 47 mL/min — ABNORMAL LOW (ref >90)
GFR, EST NON AFRICAN AMERICAN: 41 mL/min — AB (ref >90)
Glucose, Bld: 295 mg/dL — ABNORMAL HIGH (ref 70–99)
POTASSIUM: 4 mmol/L (ref 3.5–5.1)
Sodium: 134 mmol/L — ABNORMAL LOW (ref 135–145)

## 2015-03-03 LAB — CBC WITH DIFFERENTIAL/PLATELET
BASOS ABS: 0 10*3/uL (ref 0.0–0.1)
Basophils Absolute: 0 10*3/uL (ref 0.0–0.1)
Basophils Relative: 0 % (ref 0–1)
Basophils Relative: 0 % (ref 0–1)
EOS ABS: 0.1 10*3/uL (ref 0.0–0.7)
EOS ABS: 0.2 10*3/uL (ref 0.0–0.7)
Eosinophils Relative: 2 % (ref 0–5)
Eosinophils Relative: 3 % (ref 0–5)
HCT: 37.2 % (ref 36.0–46.0)
HCT: 39.7 % (ref 36.0–46.0)
HEMOGLOBIN: 12.9 g/dL (ref 12.0–15.0)
Hemoglobin: 13.9 g/dL (ref 12.0–15.0)
LYMPHS PCT: 26 % (ref 12–46)
LYMPHS PCT: 28 % (ref 12–46)
Lymphs Abs: 1.8 10*3/uL (ref 0.7–4.0)
Lymphs Abs: 2.2 10*3/uL (ref 0.7–4.0)
MCH: 30.8 pg (ref 26.0–34.0)
MCH: 30.3 pg (ref 26.0–34.0)
MCHC: 34.7 g/dL (ref 30.0–36.0)
MCHC: 35 g/dL (ref 30.0–36.0)
MCV: 88.8 fL (ref 78.0–100.0)
MCV: 86.7 fL (ref 78.0–100.0)
MPV: 10 fL (ref 8.6–12.4)
Monocytes Absolute: 0.8 10*3/uL (ref 0.1–1.0)
Monocytes Absolute: 0.8 10*3/uL (ref 0.1–1.0)
Monocytes Relative: 12 % (ref 3–12)
Monocytes Relative: 10 % (ref 3–12)
NEUTROS ABS: 4.7 10*3/uL (ref 1.7–7.7)
Neutro Abs: 4.2 10*3/uL (ref 1.7–7.7)
Neutrophils Relative %: 60 % (ref 43–77)
Neutrophils Relative %: 59 % (ref 43–77)
PLATELETS: 263 10*3/uL (ref 150–400)
PLATELETS: 231 10*3/uL (ref 150–400)
RBC: 4.19 MIL/uL (ref 3.87–5.11)
RBC: 4.58 MIL/uL (ref 3.87–5.11)
RDW: 12.8 % (ref 11.5–15.5)
RDW: 12.5 % (ref 11.5–15.5)
WBC: 7 10*3/uL (ref 4.0–10.5)
WBC: 7.9 10*3/uL (ref 4.0–10.5)

## 2015-03-03 LAB — BRAIN NATRIURETIC PEPTIDE: B Natriuretic Peptide: 544.4 pg/mL — ABNORMAL HIGH (ref 0.0–100.0)

## 2015-03-03 LAB — COMPREHENSIVE METABOLIC PANEL
ALBUMIN: 3.2 g/dL — AB (ref 3.5–5.2)
ALT: 12 U/L (ref 0–35)
AST: 11 U/L (ref 0–37)
Alkaline Phosphatase: 59 U/L (ref 39–117)
BUN: 27 mg/dL — AB (ref 6–23)
CALCIUM: 9 mg/dL (ref 8.4–10.5)
CHLORIDE: 99 meq/L (ref 96–112)
CO2: 26 mEq/L (ref 19–32)
CREATININE: 1.15 mg/dL — AB (ref 0.50–1.10)
Glucose, Bld: 279 mg/dL — ABNORMAL HIGH (ref 70–99)
Potassium: 4.3 mEq/L (ref 3.5–5.3)
Sodium: 135 mEq/L (ref 135–145)
TOTAL PROTEIN: 6 g/dL (ref 6.0–8.3)
Total Bilirubin: 0.9 mg/dL (ref 0.2–1.2)

## 2015-03-03 LAB — URINALYSIS, ROUTINE W REFLEX MICROSCOPIC
Bilirubin Urine: NEGATIVE
Glucose, UA: 500 mg/dL — AB
KETONES UR: NEGATIVE mg/dL
LEUKOCYTES UA: NEGATIVE
Nitrite: NEGATIVE
Specific Gravity, Urine: 1.014 (ref 1.005–1.030)
UROBILINOGEN UA: 0.2 mg/dL (ref 0.0–1.0)
pH: 6.5 (ref 5.0–8.0)

## 2015-03-03 LAB — URINE MICROSCOPIC-ADD ON

## 2015-03-03 LAB — TROPONIN I
TROPONIN I: 0.03 ng/mL (ref <0.031)
Troponin I: 0.06 ng/mL — ABNORMAL HIGH (ref <0.031)

## 2015-03-03 LAB — GLUCOSE, CAPILLARY: Glucose-Capillary: 325 mg/dL — ABNORMAL HIGH (ref 70–99)

## 2015-03-03 MED ORDER — LABETALOL HCL 5 MG/ML IV SOLN
10.0000 mg | INTRAVENOUS | Status: DC | PRN
  Filled 2015-03-03: qty 4

## 2015-03-03 MED ORDER — TRAZODONE HCL 150 MG PO TABS
150.0000 mg | ORAL_TABLET | Freq: Every evening | ORAL | Status: DC | PRN
  Administered 2015-03-03: 150 mg via ORAL
  Filled 2015-03-03 (×2): qty 1

## 2015-03-03 MED ORDER — SODIUM CHLORIDE 0.9 % IJ SOLN: 3.0000 mL | INTRAMUSCULAR | Status: DC | PRN

## 2015-03-03 MED ORDER — LISINOPRIL 10 MG PO TABS
10.0000 mg | ORAL_TABLET | Freq: Every day | ORAL | Status: DC
  Administered 2015-03-03: 10 mg via ORAL
  Filled 2015-03-03 (×2): qty 1

## 2015-03-03 MED ORDER — INSULIN ASPART 100 UNIT/ML ~~LOC~~ SOLN
0.0000 [IU] | Freq: Three times a day (TID) | SUBCUTANEOUS | Status: DC
  Administered 2015-03-04: 11 [IU] via SUBCUTANEOUS
  Administered 2015-03-04: 5 [IU] via SUBCUTANEOUS
  Administered 2015-03-04: 3 [IU] via SUBCUTANEOUS
  Administered 2015-03-05: 8 [IU] via SUBCUTANEOUS
  Administered 2015-03-05 (×2): 5 [IU] via SUBCUTANEOUS

## 2015-03-03 MED ORDER — SODIUM CHLORIDE 0.9 % IV SOLN: 250.0000 mL | INTRAVENOUS | Status: DC | PRN

## 2015-03-03 MED ORDER — HYDROCHLOROTHIAZIDE 12.5 MG PO CAPS
12.5000 mg | ORAL_CAPSULE | Freq: Every day | ORAL | Status: DC
  Administered 2015-03-03: 12.5 mg via ORAL
  Filled 2015-03-03 (×2): qty 1

## 2015-03-03 MED ORDER — SODIUM CHLORIDE 0.9 % IJ SOLN
3.0000 mL | Freq: Two times a day (BID) | INTRAMUSCULAR | Status: DC
  Administered 2015-03-03 – 2015-03-05 (×4): 3 mL via INTRAVENOUS

## 2015-03-03 MED ORDER — HEPARIN SODIUM (PORCINE) 5000 UNIT/ML IJ SOLN
5000.0000 [IU] | Freq: Three times a day (TID) | INTRAMUSCULAR | Status: DC
  Administered 2015-03-03 – 2015-03-06 (×7): 5000 [IU] via SUBCUTANEOUS
  Filled 2015-03-03 (×10): qty 1

## 2015-03-03 MED ORDER — SODIUM CHLORIDE 0.9 % IJ SOLN
3.0000 mL | Freq: Two times a day (BID) | INTRAMUSCULAR | Status: DC
  Administered 2015-03-03 – 2015-03-07 (×8): 3 mL via INTRAVENOUS

## 2015-03-03 MED ORDER — FUROSEMIDE 10 MG/ML IJ SOLN
40.0000 mg | Freq: Two times a day (BID) | INTRAMUSCULAR | Status: DC
  Administered 2015-03-03 – 2015-03-05 (×4): 40 mg via INTRAVENOUS
  Filled 2015-03-03 (×5): qty 4

## 2015-03-03 MED ORDER — LISINOPRIL-HYDROCHLOROTHIAZIDE 10-12.5 MG PO TABS: 0.5000 | ORAL_TABLET | Freq: Two times a day (BID) | ORAL | Status: DC

## 2015-03-03 NOTE — Patient Instructions (Signed)

## 2015-03-03 NOTE — ED Notes (Signed)
Pt c/o bilateral LE edema x 2 weeks. Denies chest pain or shortness of breath. Also reports feeling lightheaded and dizzy x 2 years "since pacemaker was placed." Pt A&Ox4; denies complaints at this time

## 2015-03-03 NOTE — ED Provider Notes (Signed)
CSN: [id number]     Arrival date & time 03/03/15  1327 History   First MD Initiated Contact with Patient 03/03/15 1335     Chief Complaint  Patient presents with  . Near Syncope  . Leg Swelling      HPI  Pt was seen at 1335. Per pt, c/o gradual onset and worsening of persistent peripheral edema for the past 1 to 2 weeks. Pt also c/o SOB, which worsens on exertion and when she lays down. Pt endorses "lightheadedness" which began s/p pacemaker placement 2 years ago. Pt's family is concerned because she "falls a lot" per [facility] notes (no family at bedside in ED), as well as pt has had urinary frequency and urgency. Denies CP/palpitations, no cough, no abd pain, no N/V/D, no focal motor weakness, no tingling/numbness in extremities, no fevers.    Past Medical History  Diagnosis Date  . Hyperlipidemia   . Hypertension   . DM type 2 (diabetes mellitus, type 2)   . Paroxysmal atrial fibrillation 09/05/2014   Past Surgical History  Procedure Laterality Date  . Cataract extraction    . Permanent pacemaker insertion N/A 04/20/2013    Procedure: PERMANENT PACEMAKER INSERTION;  Surgeon: Evans Lance, MD;  Location: [facility] CATH LAB;  Service: Cardiovascular;  Laterality: N/A;   Family History  Problem Relation Age of Onset  . Hypertension Mother   . Heart attack Father    History  Substance Use Topics  . Smoking status: Former Smoker -- 2 years    Types: Cigarettes    Quit date: 11/17/1950  . Smokeless tobacco: Not on file  . Alcohol Use: No    Review of Systems ROS: Statement: All systems negative except as marked or noted in the HPI; Constitutional: Negative for fever and chills. ; ; Eyes: Negative for eye pain, redness and discharge. ; ; ENMT: Negative for ear pain, hoarseness, nasal congestion, sinus pressure and sore throat. ; ; Cardiovascular: Negative for chest pain, palpitations, diaphoresis, +DOE, orthopnea, and peripheral edema. ; ; Respiratory: Negative for cough, wheezing and  stridor. ; ; Gastrointestinal: Negative for nausea, vomiting, diarrhea, abdominal pain, blood in stool, hematemesis, jaundice and rectal bleeding. . ; ; Genitourinary: +urinary frequency and urgency. Negative for dysuria, flank pain and hematuria. ; ; Musculoskeletal: Negative for back pain and neck pain. Negative for swelling and trauma.; ; Skin: Negative for pruritus, rash, abrasions, blisters, bruising and skin lesion.; ; Neuro: +lightheadedness. Negative for headache and neck stiffness. Negative for weakness, altered level of consciousness , altered mental status, extremity weakness, paresthesias, involuntary movement, seizure and syncope.     Allergies  Review of patient's allergies indicates no known allergies.  Home Medications   Prior to Admission medications   Medication Sig Start Date End Date Taking? Authorizing Provider  aspirin EC 81 MG tablet Take 1 tablet (81 mg total) by mouth daily. Patient not taking: Reported on 03/03/2015 09/05/14   Sanda Klein, MD  glimepiride (AMARYL) 2 MG tablet Take 1 tablet (2 mg total) by mouth daily before breakfast. Patient not taking: Reported on 03/03/2015 01/06/13   Barton Fanny, MD  lisinopril-hydrochlorothiazide (PRINZIDE,ZESTORETIC) 10-12.5 MG per tablet Take 0.5 tablets by mouth 2 (two) times daily. Patient not taking: Reported on 03/03/2015 11/17/13   Chelle S Jeffery, PA-C  metFORMIN (GLUCOPHAGE-XR) 500 MG 24 hr tablet Take 1/2 tablet in the morning, 1 tablet in the evening Patient not taking: Reported on 03/03/2015 11/17/13   Chelle S Jeffery, PA-C   BP 163/142  mmHg  Pulse 87  Temp(Src) 97.5 F (36.4 C) (Oral)  Resp 18  SpO2 90%   Filed Vitals:   03/03/15 1331 03/03/15 1345 03/03/15 1400 03/03/15 1430  BP: 197/76 219/92 209/81 163/142  Pulse: 77 81 81 87  Temp: 97.5 F (36.4 C)     TempSrc: Oral     Resp: 20 20  18   SpO2: 96% 97% 96% 90%     13:56 Orthostatic Vital Signs AJ  Orthostatic Lying  - BP- Lying: 209/88 mmHg ;  Pulse- Lying: 77  Orthostatic Sitting - BP- Sitting: 210/94 mmHg ; Pulse- Sitting: 81  Orthostatic Standing at 0 minutes - BP- Standing at 0 minutes: 209/81 mmHg ; Pulse- Standing at 0 minutes: 81      Physical Exam  1340; Physical examination:  Nursing notes reviewed; Vital signs and O2 SAT reviewed;  Constitutional: Well developed, Well nourished, In no acute distress; Head:  Normocephalic, atraumatic; Eyes: EOMI, PERRL, No scleral icterus; ENMT: Mouth and pharynx normal, Mucous membranes dry; Neck: Supple, Full range of motion, No lymphadenopathy; Cardiovascular: Regular rate and rhythm, No gallop; Respiratory: Breath sounds clear & equal bilaterally, No wheezes.  Speaking full sentences with ease, Normal respiratory effort/excursion; Chest: Nontender, Movement normal; Abdomen: Soft, Nontender, Nondistended, Normal bowel sounds; Genitourinary: No CVA tenderness; Extremities: Pulses normal, No tenderness, +1 pedal edema bilat. No calf edema or asymmetry.; Neuro: AA&Ox3, vague historian. Major CN grossly intact. Speech clear.  No facial droop.  No nystagmus. Grips equal. Strength 5/5 equal bilat UE's and LE's.  DTR 2/4 equal bilat UE's and LE's.  No gross sensory deficits.  Normal cerebellar testing bilat UE's (finger-nose) and LE's (heel-shin)..; Skin: Color normal, Warm, Dry.   ED Course  Procedures     EKG Interpretation   Date/Time:  Saturday March 03 2015 13:36:48 EDT Ventricular Rate:  76 PR Interval:  201 QRS Duration: 159 QT Interval:  460 QTC Calculation: 517 R Axis:   -77 Text Interpretation:  Sinus rhythm Left bundle branch block Left axis  deviation Baseline wander When compared with ECG of 04/21/2013 previous EKG  was paced Confirmed by [facility]  MD, Nunzio Cory [phone]) on 03/03/2015 3:15:21  PM      MDM  MDM Reviewed: previous chart, nursing note and vitals Reviewed previous: labs, ECG and x-ray Interpretation: labs and ECG      Results for orders placed or performed  during the hospital encounter of 03/03/15  Urinalysis, Routine w reflex microscopic  Result Value Ref Range   Color, Urine YELLOW YELLOW   APPearance CLEAR CLEAR   Specific Gravity, Urine 1.014 1.005 - 1.030   pH 6.5 5.0 - 8.0   Glucose, UA 500 (A) NEGATIVE mg/dL   Hgb urine dipstick TRACE (A) NEGATIVE   Bilirubin Urine NEGATIVE NEGATIVE   Ketones, ur NEGATIVE NEGATIVE mg/dL   Protein, ur >300 (A) NEGATIVE mg/dL   Urobilinogen, UA 0.2 0.0 - 1.0 mg/dL   Nitrite NEGATIVE NEGATIVE   Leukocytes, UA NEGATIVE NEGATIVE  Basic metabolic panel  Result Value Ref Range   Sodium 134 (L) 135 - 145 mmol/L   Potassium 4.0 3.5 - 5.1 mmol/L   Chloride 97 96 - 112 mmol/L   CO2 26 19 - 32 mmol/L   Glucose, Bld 295 (H) 70 - 99 mg/dL   BUN 24 (H) 6 - 23 mg/dL   Creatinine, Ser 1.19 (H) 0.50 - 1.10 mg/dL   Calcium 9.2 8.4 - 10.5 mg/dL   GFR calc non Af Amer 41 (L) >  90 mL/min   GFR calc Af Amer 47 (L) >90 mL/min   Anion gap 11 5 - 15  Troponin I  Result Value Ref Range   Troponin I 0.06 (H) <0.031 ng/mL  CBC with Differential  Result Value Ref Range   WBC 7.9 4.0 - 10.5 K/uL   RBC 4.58 3.87 - 5.11 MIL/uL   Hemoglobin 13.9 12.0 - 15.0 g/dL   HCT 39.7 36.0 - 46.0 %   MCV 86.7 78.0 - 100.0 fL   MCH 30.3 26.0 - 34.0 pg   MCHC 35.0 30.0 - 36.0 g/dL   RDW 12.5 11.5 - 15.5 %   Platelets 231 150 - 400 K/uL   Neutrophils Relative % 59 43 - 77 %   Neutro Abs 4.7 1.7 - 7.7 K/uL   Lymphocytes Relative 28 12 - 46 %   Lymphs Abs 2.2 0.7 - 4.0 K/uL   Monocytes Relative 10 3 - 12 %   Monocytes Absolute 0.8 0.1 - 1.0 K/uL   Eosinophils Relative 3 0 - 5 %   Eosinophils Absolute 0.2 0.0 - 0.7 K/uL   Basophils Relative 0 0 - 1 %   Basophils Absolute 0.0 0.0 - 0.1 K/uL  Urine microscopic-add on  Result Value Ref Range   Squamous Epithelial / LPF RARE RARE   WBC, UA 3-6 <3 WBC/hpf   RBC / HPF 0-2 <3 RBC/hpf   Casts HYALINE CASTS (A) NEGATIVE   Dg Chest 2 View 03/03/2015   CLINICAL DATA:  Two week  history of swelling and feet and lower legs.  EXAM: CHEST  2 VIEW  COMPARISON:  04/21/2013  FINDINGS: Cardiac silhouette is mildly enlarged. There is a well-positioned left anterior chest wall sequential pacemaker, stable. No mediastinal or hilar masses or evidence of adenopathy.  Lungs are mildly hyperexpanded. Lungs are clear. No pleural effusion or pneumothorax.  Bony thorax is demineralized but intact.  IMPRESSION: No acute cardiopulmonary disease.   Electronically Signed   By: Lajean Manes M.D.   On: 03/03/2015 12:57    1545:  No clear UTI on Udip; UC pending. BUN/Cr per baseline. Dx and testing d/w pt.  Questions answered.  Verb understanding, agreeable to admit. T/C to [facility] Resident, case discussed, including:  HPI, pertinent PM/SHx, VS/PE, dx testing, ED course and treatment:  States they have 18 pts on their census and cannot take [place] at this time.  T/C to [facility] Resident, case discussed, including:  HPI, pertinent PM/SHx, VS/PE, dx testing, ED course and treatment:  Agreeable to admit, requests they will come to the ED for evaluation after they d/w [facility] service.     Francine Graven, DO 03/05/15 2104

## 2015-03-03 NOTE — Progress Notes (Addendum)
[facility] [address], [place] [address] [phone]  Date:  03/03/2015   Name:  Kelly Neal   DOB:  October 11, 1928   MRN:  [id number]  PCP:  Ellsworth Lennox, MD    Chief Complaint: Foot Swelling   History of Present Illness:  Kelly Neal is a 81 y.o. very pleasant female patient who presents with the following:  "2 week" history of swelling in the feet and lower legs Has exertional shortness of breath and orthopnea No chest pain or tightness No cough No fever or chills Has frequency and urgency.   No nausea or vomiting Daughter offers the patient has been dizzy and repeatedly falling. No improvement with over the counter medications or other home remedies.  Denies other complaint or health concern today.   Patient Active Problem List   Diagnosis Date Noted  . Paroxysmal atrial fibrillation 09/05/2014  . Dizzy spells 05/26/2013  . Cardiac pacemaker in situ-St Jude implanted 04/20/13 04/21/2013  . Second degree atrioventricular block, Mobitz (type) I 04/19/2013  . Syncope 04/19/2013  . Symptomatic bradycardia 04/19/2013  . Hyperlipidemia   . Hypertension   . DM type 2 (diabetes mellitus, type 2)     Past Medical History  Diagnosis Date  . Hyperlipidemia   . Hypertension   . DM type 2 (diabetes mellitus, type 2)   . Paroxysmal atrial fibrillation 09/05/2014    Past Surgical History  Procedure Laterality Date  . Cataract extraction    . Permanent pacemaker insertion N/A 04/20/2013    Procedure: PERMANENT PACEMAKER INSERTION;  Surgeon: Evans Lance, MD;  Location: [facility] CATH LAB;  Service: Cardiovascular;  Laterality: N/A;    History  Substance Use Topics  . Smoking status: Former Smoker -- 2 years    Types: Cigarettes    Quit date: 11/17/1950  . Smokeless tobacco: Not on file  . Alcohol Use: No    Family History  Problem Relation Age of Onset  . Hypertension Mother   . Heart attack Father     No Known  Allergies  Medication list has been reviewed and updated.  Current Outpatient Prescriptions on File Prior to Visit  Medication Sig Dispense Refill  . aspirin EC 81 MG tablet Take 1 tablet (81 mg total) by mouth daily. (Patient not taking: Reported on 03/03/2015) 90 tablet 3  . glimepiride (AMARYL) 2 MG tablet Take 1 tablet (2 mg total) by mouth daily before breakfast. (Patient not taking: Reported on 03/03/2015) 90 tablet 2  . lisinopril-hydrochlorothiazide (PRINZIDE,ZESTORETIC) 10-12.5 MG per tablet Take 0.5 tablets by mouth 2 (two) times daily. (Patient not taking: Reported on 03/03/2015) 90 tablet 0  . metFORMIN (GLUCOPHAGE-XR) 500 MG 24 hr tablet Take 1/2 tablet in the morning, 1 tablet in the evening (Patient not taking: Reported on 03/03/2015) 135 tablet 0   No current facility-administered medications on file prior to visit.    Review of Systems:  As per HPI, otherwise negative.    Physical Examination: Filed Vitals:   03/03/15 1132  BP: 140/90  Pulse: 84  Temp: 97.8 F (36.6 C)  Resp: 16   Filed Vitals:   03/03/15 1132  Height: 5\' 8"  (1.727 m)  Weight: 136 lb (61.689 kg)   Body mass index is 20.68 kg/(m^2). Ideal Body Weight: Weight in (lb) to have BMI = 25: 164.1  GEN: WDWN, NAD, Non-toxic, A & O x 3 HEENT: Atraumatic, Normocephalic. Neck supple. No masses, No LAD. Ears and Nose: No external deformity.  CV: RRR, No M/G/R. No JVD. No thrill. No extra heart sounds.  No JVD or HJR PULM: CTA B, no wheezes, crackles, rhonchi. No retractions. No resp. distress. No accessory muscle use. ABD: S, NT, ND, +BS. No rebound. No HSM. EXTR: No c/c  Marked pitting to knees NEURO wheelchair bound.  PSYCH: Normally interactive. Conversant. Not depressed or anxious appearing.  Calm demeanor.    Assessment and Plan: CHF Atrial fib Pacer Urinary frequency ?infection To ER  Signed,  Ellison Carwin, MD   [facility] reading (PRIMARY) by  Dr. Luanne Bras otherwise  unremarkable.

## 2015-03-03 NOTE — H&P (Signed)
[facility] Admission History and Physical Service Pager: [phone]  Patient name: Kelly Neal Medical record number: [id number] Date of birth: 1928-10-11 Age: 81 y.o. Gender: female  Primary Care Provider: Ellsworth Lennox, MD Consultants:  Cardiology Code Status: Full  Chief Complaint: Leg swelling  Assessment and Plan: Kelly Neal is a 81 y.o. female presenting with LE edema, hypertensive urgency and proteinuria. PMH is significant for T2DM, heart block, HTN, HLD, and frequent falls.   Acute on chronic diastolic CHF: Most likely diagnosis. Pro-BNP elevated to 544 with subacute LE edema and orthopnea. Though CXR clear and previous echo from June 2014 with preserved EF, mod-severe LVH and G1DD not likely to cause this by itself.   - Assess diuresis with lasix 40mg  IV x1 - Daily weights, strict I/O - Updated 2D echocardiogram - ACE inhibitor - Lipid panel, will discuss statin with family - Cycle troponins x2 (initial elevation thought to be due to demand related to CHF/HTN)  Proteinuria: Concern for glomerular disease in setting of hypertensive urgency and edema. Hyaline casts only on U/A. Of note, had > 300 protein on U/A in June 2014.  - 24 hr urine protein collection - Renal U/S  Hypertensive urgency: No evidence of end-organ failure.  - Restart home med: lisinopril-HCTZ 10-12.5mg  - Labetalol 10mg  IV q2h prn: OK to use beta blocker in diastolic CHF with pacer  History of mobitz type 1 heart block: s/p pacemaker June 2014, now pacemaker-dependent.  - Telemetry   T2DM: Denies taking medications, though last A1c (2014) was 6.6%.  - SSI - Check Hb A1c  Weakness: Concern for primary process or myopathy as well as general deconditioning secondary to pain from LE edema. Will hold on work up for PMR, etc. for now.  - PT/OT - TSH  FEN/GI: Heart healthy/carb modified diet; saline lock IV Prophylaxis: Subcutaneous heparin  Disposition: Admit  to FMTS with telemetry  History of Present Illness: Kelly Neal is a 81 y.o. female presenting with leg swelling.   Kelly Neal reports gradual onset of worsening persistent symmetric swelling of her legs over the past weeks, though her daughter reports to have noticed this several weeks prior. She endorses dyspnea worse with moving around and with laying down flat. She reports growing weaker recently requiring a walker when she only recently required a cane. She denies chest pain and palpitations, though reports "dizziness" with an electrical sensation in her face intermittently since a pacemaker was inserted in June 2014. She also reports urinary frequency without dysuria. No fevers, weight loss, abd pain, N/V/D.   She lives with her Son and has her daughter and son-in-law taking care of her close by.   Review Of Systems: Per HPI  Otherwise 12 point review of systems was performed and was unremarkable.  Patient Active Problem List   Diagnosis Date Noted  . Acute CHF 03/03/2015  . Paroxysmal atrial fibrillation 09/05/2014  . Dizzy spells 05/26/2013  . Cardiac pacemaker in situ-St Jude implanted 04/20/13 04/21/2013  . Second degree atrioventricular block, Mobitz (type) I 04/19/2013  . Syncope 04/19/2013  . Symptomatic bradycardia 04/19/2013  . Hyperlipidemia   . Hypertension   . DM type 2 (diabetes mellitus, type 2)    Past Medical History: Past Medical History  Diagnosis Date  . Hyperlipidemia   . Hypertension   . DM type 2 (diabetes mellitus, type 2)   . Paroxysmal atrial fibrillation 09/05/2014   Past Surgical History: Past Surgical History  Procedure Laterality Date  .  Cataract extraction    . Permanent pacemaker insertion N/A 04/20/2013    Procedure: PERMANENT PACEMAKER INSERTION;  Surgeon: Evans Lance, MD;  Location: [facility] CATH LAB;  Service: Cardiovascular;  Laterality: N/A;   Social History: History  Substance Use Topics  . Smoking status: Former Smoker -- 2 years     Types: Cigarettes    Quit date: 11/17/1950  . Smokeless tobacco: Not on file  . Alcohol Use: No   Additional social history: Denies EtOH, illicit drugs  Please also refer to relevant sections of EMR.  Family History: Family History  Problem Relation Age of Onset  . Hypertension Mother   . Heart attack Father    Allergies and Medications: No Known Allergies No current facility-administered medications on file prior to encounter.   Current Outpatient Prescriptions on File Prior to Encounter  Medication Sig Dispense Refill  . aspirin EC 81 MG tablet Take 1 tablet (81 mg total) by mouth daily. (Patient not taking: Reported on 03/03/2015) 90 tablet 3  . glimepiride (AMARYL) 2 MG tablet Take 1 tablet (2 mg total) by mouth daily before breakfast. (Patient not taking: Reported on 03/03/2015) 90 tablet 2  . lisinopril-hydrochlorothiazide (PRINZIDE,ZESTORETIC) 10-12.5 MG per tablet Take 0.5 tablets by mouth 2 (two) times daily. (Patient not taking: Reported on 03/03/2015) 90 tablet 0  . metFORMIN (GLUCOPHAGE-XR) 500 MG 24 hr tablet Take 1/2 tablet in the morning, 1 tablet in the evening (Patient not taking: Reported on 03/03/2015) 135 tablet 0    Objective: BP 170/93 mmHg  Pulse 65  Temp(Src) 97.5 F (36.4 C) (Oral)  Resp 11  SpO2 96% Exam: General: Very pleasant elderly woman in no distress HEENT: Oropharynx clear and moist, fair dentition Cardiovascular: Regular rate without murmur, mild JVD Respiratory: Nonlabored on room air, no crackles or wheezes Abdomen: Sift, NT, ND, +BS Extremities: 3+ pitting edema to the knees bilaterally without edema on UE's Skin: Some venous stasis changes bilaterally, otherwise no wounds or rashes.  Neuro: Alert and oriented, speech normal. Very weak proximal musculature. Nonfocal.   Labs and Imaging: CBC BMET   Recent Labs Lab 03/03/15 1347  WBC 7.9  HGB 13.9  HCT 39.7  PLT 231    Recent Labs Lab 03/03/15 1347  NA 134*  K 4.0  CL 97  CO2  26  BUN 24*  CREATININE 1.19*  GLUCOSE 295*  CALCIUM 9.2     BNP: 544 Trop 0.06 Urinalysis COLORURINE YELLOW  APPEARANCEU CLEAR  LABSPEC 1.014  PHURINE 6.5  GLUCOSEU 500*  HGBUR TRACE*  BILIRUBINUR NEGATIVE  BILIRUBINUR neg  KETONESUR NEGATIVE  PROTEINUR >300*  PROTEINUR neg  UROBILINOGEN 0.2  UROBILINOGEN 0.2  NITRITE NEGATIVE  NITRITE neg  LEUKOCYTESU NEGATIVE   CXR: clear without infiltrate or pulmonary edema ECG: paced rhythm  Patrecia Pour, MD 03/03/2015, 6:06 PM PGY-2, [place] Intern pager: [phone], text pages welcome

## 2015-03-03 NOTE — Progress Notes (Signed)
Patient came to the floor around 1800, alert oriented, denies pain, no shortness of breath. BP elevated 192/90, recheck bp 186/74. MD notified. Awaiting md order. V pace on the monitor in 70. Will continue to monitor patient.

## 2015-03-03 NOTE — ED Notes (Signed)
Pt reports feeling dizzy since pacemaker placement 2 years ago. Went to UC today due to bilateral edema in low extremeties. Denies chest pain or SOB. Lives at home with son.

## 2015-03-04 ENCOUNTER — Encounter (HOSPITAL_COMMUNITY): Payer: Self-pay | Admitting: Cardiology

## 2015-03-04 ENCOUNTER — Inpatient Hospital Stay (HOSPITAL_COMMUNITY): Payer: Medicare Other

## 2015-03-04 DIAGNOSIS — R7989 Other specified abnormal findings of blood chemistry: Secondary | ICD-10-CM

## 2015-03-04 DIAGNOSIS — R600 Localized edema: Secondary | ICD-10-CM | POA: Insufficient documentation

## 2015-03-04 DIAGNOSIS — I5041 Acute combined systolic (congestive) and diastolic (congestive) heart failure: Secondary | ICD-10-CM

## 2015-03-04 DIAGNOSIS — R778 Other specified abnormalities of plasma proteins: Secondary | ICD-10-CM | POA: Insufficient documentation

## 2015-03-04 DIAGNOSIS — R609 Edema, unspecified: Secondary | ICD-10-CM | POA: Insufficient documentation

## 2015-03-04 DIAGNOSIS — I509 Heart failure, unspecified: Secondary | ICD-10-CM

## 2015-03-04 DIAGNOSIS — R809 Proteinuria, unspecified: Secondary | ICD-10-CM | POA: Insufficient documentation

## 2015-03-04 DIAGNOSIS — I480 Paroxysmal atrial fibrillation: Secondary | ICD-10-CM

## 2015-03-04 LAB — PROTEIN, URINE, 24 HOUR
Collection Interval-UPROT: 24 hours
Protein, 24H Urine: 3192 mg/d — ABNORMAL HIGH (ref 50–100)
Protein, Urine: 133 mg/dL
URINE TOTAL VOLUME-UPROT: 2400 mL

## 2015-03-04 LAB — BASIC METABOLIC PANEL
Anion gap: 13 (ref 5–15)
BUN: 25 mg/dL — ABNORMAL HIGH (ref 6–23)
CHLORIDE: 97 mmol/L (ref 96–112)
CO2: 26 mmol/L (ref 19–32)
Calcium: 9.1 mg/dL (ref 8.4–10.5)
Creatinine, Ser: 1.22 mg/dL — ABNORMAL HIGH (ref 0.50–1.10)
GFR calc Af Amer: 46 mL/min — ABNORMAL LOW (ref >90)
GFR, EST NON AFRICAN AMERICAN: 40 mL/min — AB (ref >90)
Glucose, Bld: 344 mg/dL — ABNORMAL HIGH (ref 70–99)
Potassium: 4 mmol/L (ref 3.5–5.1)
SODIUM: 136 mmol/L (ref 135–145)

## 2015-03-04 LAB — GLUCOSE, CAPILLARY
GLUCOSE-CAPILLARY: 303 mg/dL — AB (ref 70–99)
GLUCOSE-CAPILLARY: 246 mg/dL — AB (ref 70–99)
Glucose-Capillary: 216 mg/dL — ABNORMAL HIGH (ref 70–99)
Glucose-Capillary: 199 mg/dL — ABNORMAL HIGH (ref 70–99)

## 2015-03-04 LAB — CBC
HCT: 41.7 % (ref 36.0–46.0)
HEMOGLOBIN: 14.5 g/dL (ref 12.0–15.0)
MCH: 30.7 pg (ref 26.0–34.0)
MCHC: 34.8 g/dL (ref 30.0–36.0)
MCV: 88.2 fL (ref 78.0–100.0)
Platelets: 220 10*3/uL (ref 150–400)
RBC: 4.73 MIL/uL (ref 3.87–5.11)
RDW: 12.3 % (ref 11.5–15.5)
WBC: 7.5 10*3/uL (ref 4.0–10.5)

## 2015-03-04 LAB — LIPID PANEL
Cholesterol: 239 mg/dL — ABNORMAL HIGH (ref 0–200)
HDL: 50 mg/dL (ref >39)
LDL Cholesterol: 153 mg/dL — ABNORMAL HIGH (ref 0–99)
TRIGLYCERIDES: 178 mg/dL — AB (ref <150)
Total CHOL/HDL Ratio: 4.8 RATIO
VLDL: 36 mg/dL (ref 0–40)

## 2015-03-04 LAB — TROPONIN I
TROPONIN I: 0.05 ng/mL — AB (ref <0.031)
Troponin I: 0.03 ng/mL (ref <0.031)

## 2015-03-04 LAB — TSH: TSH: 2.522 u[IU]/mL (ref 0.350–4.500)

## 2015-03-04 MED ORDER — LISINOPRIL 10 MG PO TABS
10.0000 mg | ORAL_TABLET | Freq: Every day | ORAL | Status: DC
  Administered 2015-03-04: 10 mg via ORAL
  Filled 2015-03-04 (×2): qty 1

## 2015-03-04 MED ORDER — ASPIRIN 81 MG PO CHEW
81.0000 mg | CHEWABLE_TABLET | Freq: Every day | ORAL | Status: DC
  Administered 2015-03-04 – 2015-03-07 (×4): 81 mg via ORAL
  Filled 2015-03-04 (×3): qty 1

## 2015-03-04 NOTE — Progress Notes (Signed)
Patient resting quietly. Output increased after receiving IV Lasix. BP medication and trazodone given.

## 2015-03-04 NOTE — Progress Notes (Signed)
  Echocardiogram 2D Echocardiogram has been performed.  Bobbye Charleston 03/04/2015, 3:56 PM

## 2015-03-04 NOTE — Progress Notes (Signed)
Family Medicine Teaching Service Daily Progress Note Intern Pager: [phone]  Patient name: Kelly Neal Medical record number: [id number] Date of birth: August 11, 1928 Age: 81 y.o. Gender: female  Primary Care Provider: Ellsworth Lennox, MD Consultants: Cardiology (HF) Code Status: FULL  Pt Overview and Major Events to Date:  4/16 - admitted, leg swelling with BNP 544, presumed acute-on-chronic CHF, hypertensive urgency with proteinuria 4/17 - BP improved, normal renal US; troponin with very slight increase (?demand); cardiology consulted  Assessment and Plan: Kelly Neal is a 81 y.o. female presenting with LE edema, hypertensive urgency and proteinuria. PMH is significant for T2DM, heart block (s/p pacer placement in 2014), HTN, HLD, and frequent falls.   Acute on chronic diastolic CHF: On admission, Pro-BNP elevated to 544 with subacute LE edema and orthopnea.  - CXR clear, echo from June 2014 with preserved EF, mod-severe LVH and G1DD - Hx of heart block but ED EKG without obvious pacer spikes; sinus rhythm by clinical exam - troponin mildly elevated (0.6 > 0.03 > 0.05) but strongly doubt frank MI --> will repeat to make sure no continued rise - will consult cardiology this morning; appreciate any recommendations - continue Lasix 40 mg IV BID for now - Daily weights, strict I/O - repeat echocardiogram pending - on ACE inhibitor, may need long-term beta-blocker therapy but has hx of heart block with pacer - Cycle troponins x2 (initial elevation thought to be due to demand related to CHF/HTN)  Proteinuria: possible glomerular disease in setting of hypertensive urgency and edema. Hyaline casts only on U/A. Of note, had > 300 protein on U/A in June 2014. Renal US normal, as above - 24 hr urine protein collection in process - managing BP (see immediately below)  Hypertensive urgency: BP to the 200's over 100's on admission, improved overnight; renal ultrasound normal - continue  home lisinopril-HCTZ 10-12.5 mg; concern for irregular compliance, at home. - continue labetalol 10mg  IV q2h PRN - appreciate any input from cardiology, as above  History of mobitz type 1 heart block: s/p pacemaker June 2014, now pacemaker-dependent.  - continue telemetry monitoring  T2DM: Denies taking medications, though last A1c (2014) was 6.6% and Amaryl and metformin are on her home med list - recheck A1c pending - continue SSI with CBG's qAC+HS, for now  Weakness: Broad DDx, including primary process autoimmune / inflammatory process or myopathy, general deconditioning, secondary to CHF, etc;TSH normal - pending PT/OT evaluation / recommendations  FEN/GI: Heart healthy/carb modified diet; saline lock IV Prophylaxis: Subcutaneous heparin  Disposition: management as above; final dispo planning pending improvement in BP control and volume status - potentially may need short-term SNF stay, but pt is hopeful for home health if needed rather than SNF  Subjective: Pt without specific complaints this morning other than she did not sleep well. Denies frank chest pain, SOB, but does still feel weak with little energy. Reports she has not taken BP meds at home "for quite some time," and does not regularly check a fluid pill. Per her, she last saw Dr. Sallyanne Kuster "several months ago" and states she was told to "drink lots of water" because she was dehydrated.  Objective: Temp:  [97.5 F (36.4 C)-98.1 F (36.7 C)] 98.1 F (36.7 C) (04/17 0604) Pulse Rate:  [65-87] 70 (04/17 0604) Resp:  [11-20] 18 (04/17 0604) BP: (131-219)/(60-142) 131/60 mmHg (04/17 0604) SpO2:  [90 %-98 %] 96 % (04/17 0604) Weight:  [131 lb 4.8 oz (59.557 kg)-136 lb (61.689 kg)] 131 lb 4.8 oz (59.557  kg) (04/17 0604) Physical Exam: General: Very pleasant elderly woman in no distress HEENT: MMM Cardiovascular: RRR without frank murmur, mild JVD Respiratory: Nonlabored breathing, CTAB, on room air Abdomen: soft,  nontender, BS+ Extremities: doughy 3+ pitting edema to the knees bilaterally Skin: chronic venous stasis skin changes bilaterally, otherwise no wounds or rashes Neuro: Alert and oriented, speech normal  Strength 5/5 in grip, 4+/5 more proximally in UE's  Laboratory:  Recent Labs Lab 03/03/15 1221 03/03/15 1347 03/04/15 0042  WBC 7.0 7.9 7.5  HGB 12.9 13.9 14.5  HCT 37.2 39.7 41.7  PLT 263 231 220    Recent Labs Lab 03/03/15 1221 03/03/15 1347 03/04/15 0042  NA 135 134* 136  K 4.3 4.0 4.0  CL 99 97 97  CO2 26 26 26   BUN 27* 24* 25*  CREATININE 1.15* 1.19* 1.22*  CALCIUM 9.0 9.2 9.1  PROT 6.0  --   --   BILITOT 0.9  --   --   ALKPHOS 59  --   --   ALT 12  --   --   AST 11  --   --   GLUCOSE 279* 295* 344*     Imaging/Diagnostic Tests: ED EKG: LBBB (not new), no obvious pacer spikes CXR 4/16 @1254 : pacer in place; no acute cardiopulmonary disease Renal US 4/17: normal study  Emmaline Kluver, MD 03/04/2015, 9:58 AM PGY-3, [place] Intern pager: [phone], text pages welcome

## 2015-03-04 NOTE — Consult Note (Signed)
Reason for Consult: HTN urgency, chf   Referring Physician:  Dr. Venetia Maxon   PCP:  Ellsworth Lennox, MD  Primary Cardiologist:Dr. Jerilynn Mages. Croitoru   Kelly Neal is an 81 y.o. female.    Chief Complaint: admitted 03/03/15 with HTN urgency and CHF.  Pro bnp elevated.    HPI: 81 y.o. female presenting with LE edema, hypertensive urgency and proteinuria. PMH is significant for T2DM, heart block, HTN, HLD, and frequent falls.  On last interrogation (st Jude device) she did have PAF- very brief episodes.  Here she is in SR with v pacing LBBB.  Echo 2014 EF 65-70% with moderate to severe concentric LVH and G1DD.  Echo pending this admit.  Troponin 0.06-0.05 may be demand ischemia, I see no record of Nuc study or cath.  Renal u/s normal.  No chest pain.     Past Medical History  Diagnosis Date  . Hyperlipidemia   . Hypertension   . DM type 2 (diabetes mellitus, type 2)   . Paroxysmal atrial fibrillation 09/05/2014    Past Surgical History  Procedure Laterality Date  . Cataract extraction    . Permanent pacemaker insertion N/A 04/20/2013    Procedure: PERMANENT PACEMAKER INSERTION;  Surgeon: Evans Lance, MD;  Location: [facility] CATH LAB;  Service: Cardiovascular;  Laterality: N/A;    Family History  Problem Relation Age of Onset  . Hypertension Mother   . Heart attack Father    Social History:  reports that she quit smoking about 64 years ago. Her smoking use included Cigarettes. She quit after 2 years of use. She does not have any smokeless tobacco history on file. She reports that she does not drink alcohol or use illicit drugs.  Allergies: No Known Allergies  Medications Prior to Admission  Medication Sig Dispense Refill  . aspirin EC 81 MG tablet Take 1 tablet (81 mg total) by mouth daily. (Patient not taking: Reported on 03/03/2015) 90 tablet 3  . glimepiride (AMARYL) 2 MG tablet Take 1 tablet (2 mg total) by mouth daily before breakfast. (Patient not taking:  Reported on 03/03/2015) 90 tablet 2  . lisinopril-hydrochlorothiazide (PRINZIDE,ZESTORETIC) 10-12.5 MG per tablet Take 0.5 tablets by mouth 2 (two) times daily. (Patient not taking: Reported on 03/03/2015) 90 tablet 0  . metFORMIN (GLUCOPHAGE-XR) 500 MG 24 hr tablet Take 1/2 tablet in the morning, 1 tablet in the evening (Patient not taking: Reported on 03/03/2015) 135 tablet 0    Results for orders placed or performed during the hospital encounter of 03/03/15 (from the past 48 hour(s))  Urinalysis, Routine w reflex microscopic     Status: Abnormal   Collection Time: 03/03/15  1:36 PM  Result Value Ref Range   Color, Urine YELLOW YELLOW   APPearance CLEAR CLEAR   Specific Gravity, Urine 1.014 1.005 - 1.030   pH 6.5 5.0 - 8.0   Glucose, UA 500 (A) NEGATIVE mg/dL   Hgb urine dipstick TRACE (A) NEGATIVE   Bilirubin Urine NEGATIVE NEGATIVE   Ketones, ur NEGATIVE NEGATIVE mg/dL   Protein, ur >300 (A) NEGATIVE mg/dL   Urobilinogen, UA 0.2 0.0 - 1.0 mg/dL   Nitrite NEGATIVE NEGATIVE   Leukocytes, UA NEGATIVE NEGATIVE  Urine microscopic-add on     Status: Abnormal   Collection Time: 03/03/15  1:36 PM  Result Value Ref Range   Squamous Epithelial / LPF RARE RARE   WBC, UA 3-6 <3 WBC/hpf   RBC / HPF 0-2 <3 RBC/hpf  Casts HYALINE CASTS (A) NEGATIVE  Basic metabolic panel     Status: Abnormal   Collection Time: 03/03/15  1:47 PM  Result Value Ref Range   Sodium 134 (L) 135 - 145 mmol/L   Potassium 4.0 3.5 - 5.1 mmol/L   Chloride 97 96 - 112 mmol/L   CO2 26 19 - 32 mmol/L   Glucose, Bld 295 (H) 70 - 99 mg/dL   BUN 24 (H) 6 - 23 mg/dL   Creatinine, Ser 1.19 (H) 0.50 - 1.10 mg/dL   Calcium 9.2 8.4 - 10.5 mg/dL   GFR calc non Af Amer 41 (L) >90 mL/min   GFR calc Af Amer 47 (L) >90 mL/min    Comment: (NOTE) The eGFR has been calculated using the CKD EPI equation. This calculation has not been validated in all clinical situations. eGFR's persistently <90 mL/min signify possible Chronic  Kidney Disease.    Anion gap 11 5 - 15  Brain natriuretic peptide     Status: Abnormal   Collection Time: 03/03/15  1:47 PM  Result Value Ref Range   B Natriuretic Peptide 544.4 (H) 0.0 - 100.0 pg/mL  Troponin I     Status: Abnormal   Collection Time: 03/03/15  1:47 PM  Result Value Ref Range   Troponin I 0.06 (H) <0.031 ng/mL    Comment:        PERSISTENTLY INCREASED TROPONIN VALUES IN THE RANGE OF 0.04-0.49 ng/mL CAN BE SEEN IN:       -UNSTABLE ANGINA       -CONGESTIVE HEART FAILURE       -MYOCARDITIS       -CHEST TRAUMA       -ARRYHTHMIAS       -LATE PRESENTING MYOCARDIAL INFARCTION       -COPD   CLINICAL FOLLOW-UP RECOMMENDED.   CBC with Differential     Status: None   Collection Time: 03/03/15  1:47 PM  Result Value Ref Range   WBC 7.9 4.0 - 10.5 K/uL   RBC 4.58 3.87 - 5.11 MIL/uL   Hemoglobin 13.9 12.0 - 15.0 g/dL   HCT 39.7 36.0 - 46.0 %   MCV 86.7 78.0 - 100.0 fL   MCH 30.3 26.0 - 34.0 pg   MCHC 35.0 30.0 - 36.0 g/dL   RDW 12.5 11.5 - 15.5 %   Platelets 231 150 - 400 K/uL   Neutrophils Relative % 59 43 - 77 %   Neutro Abs 4.7 1.7 - 7.7 K/uL   Lymphocytes Relative 28 12 - 46 %   Lymphs Abs 2.2 0.7 - 4.0 K/uL   Monocytes Relative 10 3 - 12 %   Monocytes Absolute 0.8 0.1 - 1.0 K/uL   Eosinophils Relative 3 0 - 5 %   Eosinophils Absolute 0.2 0.0 - 0.7 K/uL   Basophils Relative 0 0 - 1 %   Basophils Absolute 0.0 0.0 - 0.1 K/uL  Troponin I     Status: None   Collection Time: 03/03/15  7:29 PM  Result Value Ref Range   Troponin I 0.03 <0.031 ng/mL    Comment:        NO INDICATION OF MYOCARDIAL INJURY.   Glucose, capillary     Status: Abnormal   Collection Time: 03/03/15  9:17 PM  Result Value Ref Range   Glucose-Capillary 325 (H) 70 - 99 mg/dL  TSH     Status: None   Collection Time: 03/04/15 12:42 AM  Result Value Ref Range   TSH  2.522 0.350 - 4.500 uIU/mL  Troponin I     Status: Abnormal   Collection Time: 03/04/15 12:42 AM  Result Value Ref Range    Troponin I 0.05 (H) <0.031 ng/mL    Comment:        PERSISTENTLY INCREASED TROPONIN VALUES IN THE RANGE OF 0.04-0.49 ng/mL CAN BE SEEN IN:       -UNSTABLE ANGINA       -CONGESTIVE HEART FAILURE       -MYOCARDITIS       -CHEST TRAUMA       -ARRYHTHMIAS       -LATE PRESENTING MYOCARDIAL INFARCTION       -COPD   CLINICAL FOLLOW-UP RECOMMENDED.   Basic metabolic panel     Status: Abnormal   Collection Time: 03/04/15 12:42 AM  Result Value Ref Range   Sodium 136 135 - 145 mmol/L   Potassium 4.0 3.5 - 5.1 mmol/L   Chloride 97 96 - 112 mmol/L   CO2 26 19 - 32 mmol/L   Glucose, Bld 344 (H) 70 - 99 mg/dL   BUN 25 (H) 6 - 23 mg/dL   Creatinine, Ser 1.22 (H) 0.50 - 1.10 mg/dL   Calcium 9.1 8.4 - 10.5 mg/dL   GFR calc non Af Amer 40 (L) >90 mL/min   GFR calc Af Amer 46 (L) >90 mL/min    Comment: (NOTE) The eGFR has been calculated using the CKD EPI equation. This calculation has not been validated in all clinical situations. eGFR's persistently <90 mL/min signify possible Chronic Kidney Disease.    Anion gap 13 5 - 15  CBC     Status: None   Collection Time: 03/04/15 12:42 AM  Result Value Ref Range   WBC 7.5 4.0 - 10.5 K/uL   RBC 4.73 3.87 - 5.11 MIL/uL   Hemoglobin 14.5 12.0 - 15.0 g/dL   HCT 41.7 36.0 - 46.0 %   MCV 88.2 78.0 - 100.0 fL   MCH 30.7 26.0 - 34.0 pg   MCHC 34.8 30.0 - 36.0 g/dL   RDW 12.3 11.5 - 15.5 %   Platelets 220 150 - 400 K/uL  Lipid panel     Status: Abnormal   Collection Time: 03/04/15 12:42 AM  Result Value Ref Range   Cholesterol 239 (H) 0 - 200 mg/dL   Triglycerides 178 (H) <150 mg/dL   HDL 50 >39 mg/dL   Total CHOL/HDL Ratio 4.8 RATIO   VLDL 36 0 - 40 mg/dL   LDL Cholesterol 153 (H) 0 - 99 mg/dL    Comment:        Total Cholesterol/HDL:CHD Risk Coronary Heart Disease Risk Table                     Men   Women  1/2 Average Risk   3.4   3.3  Average Risk       5.0   4.4  2 X Average Risk   9.6   7.1  3 X Average Risk  23.4   11.0         Use the calculated Patient Ratio above and the CHD Risk Table to determine the patient's CHD Risk.        ATP III CLASSIFICATION (LDL):  <100     mg/dL   Optimal  100-129  mg/dL   Near or Above                    Optimal  130-159  mg/dL   Borderline  160-189  mg/dL   High  >190     mg/dL   Very High   Glucose, capillary     Status: Abnormal   Collection Time: 03/04/15  6:15 AM  Result Value Ref Range   Glucose-Capillary 303 (H) 70 - 99 mg/dL   Dg Chest 2 View  03/03/2015   CLINICAL DATA:  Two week history of swelling and feet and lower legs.  EXAM: CHEST  2 VIEW  COMPARISON:  04/21/2013  FINDINGS: Cardiac silhouette is mildly enlarged. There is a well-positioned left anterior chest wall sequential pacemaker, stable. No mediastinal or hilar masses or evidence of adenopathy.  Lungs are mildly hyperexpanded. Lungs are clear. No pleural effusion or pneumothorax.  Bony thorax is demineralized but intact.  IMPRESSION: No acute cardiopulmonary disease.   Electronically Signed   By: Lajean Manes M.D.   On: 03/03/2015 12:57   US Renal  03/04/2015   CLINICAL DATA:  Proteinuria, hypertension.  EXAM: RENAL/URINARY TRACT ULTRASOUND COMPLETE  COMPARISON:  None.  FINDINGS: Right Kidney:  Length: 12.9 cm. Echogenicity within normal limits. No mass or hydronephrosis visualized.  Left Kidney:  Length: 11.4 cm. Echogenicity within normal limits. No mass or hydronephrosis visualized.  Bladder:  Appears normal for degree of bladder distention.  IMPRESSION: Normal renal ultrasound.   Electronically Signed   By: Jeb Levering M.D.   On: 03/04/2015 01:46    ROS: General:no colds or fevers, weight down 5 lbs from yesterday but yesterdays wt seen to be her normal at least from [place]. Skin:no rashes or ulcers HEENT:no blurred vision, no congestion CV:see HPI PUL:see HPI GI:no diarrhea constipation or melena, no indigestion GU:no hematuria, no dysuria MS:no joint pain, no claudication Neuro:no syncope, +  lightheadedness since she had pacer placed Endo:+ diabetes, no thyroid disease   Blood pressure 131/60, pulse 70, temperature 98.1 F (36.7 C), temperature source Oral, resp. rate 18, height _0  (1.727 m), weight 131 lb 4.8 oz (59.557 kg), SpO2 96 %.  Wt Readings from Last 3 Encounters:  03/04/15 131 lb 4.8 oz (59.557 kg)  03/03/15 136 lb (61.689 kg)  09/05/14 137 lb 4.8 oz (62.279 kg)    PE: General:Pleasant affect, NAD Skin:Warm and dry, brisk capillary refill HEENT:normocephalic, sclera clear, mucus membranes moist Neck:supple, no JVD, no bruits  Heart:S1S2 RRR without murmur, gallup, rub or click Lungs:clear without rales, rhonchi, or wheezes HAL:PFXT, non tender, + BS, do not palpate liver spleen or masses Ext: 2+ lower ext edema, 2+ pedal pulses, 2+ radial pulses Neuro:alert and oriented X 3, MAE, follows commands, + facial symmetry    Assessment/Plan Principal Problem:   Acute CHF- on lasix 40 mg IV BID at home on HCTZ 12.5 await echo.  Pt has increased water intake to just over 1 quart.   Active Problems:   Hyperlipidemia not on statin    Hypertension- 182/74, to 131/60 now ---on admit 209/81 without orthostatic drop    Cardiac pacemaker in situ-St Jude implanted 04/20/13, pacing at times    Paroxysmal atrial fibrillation- per Dr. Jerilynn Mages. Croitoru in oct brief episodes- (CHADSVasc2 score is at least 5) Dr. Jerilynn Mages. Croitoru has not wanted to place on anticoagulation with hx of falls. Per Dr. Jerilynn Mages. Croitoru "If the prevalence and duration of A. fib episodes increases or if she develops any neurological events, may have to change strategy to full anticoagulation."    [facility] R  Nurse Practitioner Certified [place] Pager [phone]  or after 5pm or weekends call [phone] 03/04/2015, 11:32 AM As above, patient seen and examined. Briefly she is an 81 year old female with past medical history of hypertension, hyperlipidemia, diabetes mellitus, paroxysmal  atrial fibrillation, prior pacemaker admitted with acute on chronic diastolic congestive heart failure and hypertension. Patient was admitted with bilateral lower extremity edema, dyspnea on exertion and hypertension. No chest pain. Electrocardiogram shows AV pacing.  Patient presents with symptoms of congestive heart failure. Repeat echocardiogram to assess LV function. Agree with Lasix 40 mg IV twice a day. Follow renal function closely. Discontinue HCTZ. Blood pressure was significantly elevated as well. Resume lisinopril 10 mg daily. Increase as needed for blood pressure control. Troponin is borderline with no clear trend. It is not consistent with an acute coronary syndrome. No further ischemia evaluation. Patient apparently has a history of brief paroxysmal atrial fibrillation but Dr Sallyanne Kuster did not want to start anticoagulation as she has chronic dizziness and unsteadiness. She is in atrial paced rhythm. Follow-up with Dr Sallyanne Kuster following DC for further management; continue ASA. Kirk Ruths

## 2015-03-04 NOTE — Evaluation (Signed)
Physical Therapy Evaluation Patient Details Name: Kelly Neal MRN: [id number] DOB: 09-09-1928 Today's Date: 03/04/2015   History of Present Illness  Patient is an 81 yo female admitted 03/03/15 with SOB and LE edema.  Patient with CHF exacerbation and hypertensive urgency.  PMH:  DM, heart block s/p pacemaker, HTN, PAF, frequent falls.  Clinical Impression  Patient presents with problems listed below.  Will benefit from acute PT to maximize independence prior to discharge. Patient requiring mod assist for mobility/safety.  Recommend SNF at discharge for continued therapy and 24 hour assist.    Follow Up Recommendations SNF;Supervision/Assistance - 24 hour    Equipment Recommendations  None recommended by PT    Recommendations for Other Services       Precautions / Restrictions Precautions Precautions: Fall Precaution Comments: H/o falls at home Restrictions Weight Bearing Restrictions: No      Mobility  Bed Mobility Overal bed mobility: Needs Assistance Bed Mobility: Rolling;Sidelying to Sit;Sit to Supine Rolling: Min guard Sidelying to sit: Mod assist   Sit to supine: Mod assist   General bed mobility comments: Verbal cues to use rail for rolling.  Patient able to initiate moving LE's off of bed.  Required mod assist to raise trunk to sitting position.  Initially required mod assist to maintain balance, with posterior lean.  Cues to shift weight forward and scoot to EOB.  Patient able to maintain balance with min guard assist.  Required mod assist to bring LE's onto bed to return to supine.  Transfers Overall transfer level: Needs assistance Equipment used: Rolling walker (2 wheeled) Transfers: Sit to/from Stand Sit to Stand: Mod assist;Min assist         General transfer comment: Verbal cues for hand placement.  Assist to power up to standing and for balance initially.  Patient with posterior lean.  Verbal cues and physical assist to move RW forward and shift  weight forward over feet.  Ambulation/Gait Ambulation/Gait assistance: Mod assist Ambulation Distance (Feet): 8 Feet (4' x2 to [facility]) Assistive device: Rolling walker (2 wheeled) Gait Pattern/deviations: Step-through pattern;Decreased step length - right;Decreased step length - left;Decreased stride length;Shuffle;Leaning posteriorly;Trunk flexed     General Gait Details: Verbal cues for safe use of RW.  Required physical assist at times for RW placement.  Patient taking hand off of RW while talking with daughter - reminders to keep hands on RW at all times for safety.  Cues to shift weight forward during gait.  Required verbal cues to process turning around at [facility].  Patient requiring min assist at end of gait for balance and safety.  Stairs            Wheelchair Mobility    Modified Rankin (Stroke Patients Only)       Balance Overall balance assessment: Needs assistance Sitting-balance support: Bilateral upper extremity supported;Feet supported Sitting balance-Leahy Scale: Poor   Postural control: Posterior lean Standing balance support: Bilateral upper extremity supported Standing balance-Leahy Scale: Poor Standing balance comment: Posterior lean                             Pertinent Vitals/Pain Pain Assessment: No/denies pain    Home Living Family/patient expects to be discharged to:: Private residence Living Arrangements: Children (Son who has high functioning autism) Available Help at Discharge: Family;Available PRN/intermittently (Son goes to center at scheduled times-patient alone) Type of Home: House Home Access: Level entry     Home Layout: One level Home  Equipment: Gilford Rile - 2 wheels;Cane - single point;Toilet riser;Shower seat      Prior Function Level of Independence: Independent with assistive device(s);Needs assistance   Gait / Transfers Assistance Needed: Recently progressed from cane to RW.  Mult falls.  ADL's / Homemaking Assistance  Needed: Recently is requiring assist with bathing/dressing and meals.        Hand Dominance        Extremity/Trunk Assessment   Upper Extremity Assessment: Generalized weakness           Lower Extremity Assessment: Generalized weakness         Communication   Communication: No difficulties  Cognition Arousal/Alertness: Lethargic (Patient with minimal sleep last pm per RN) Behavior During Therapy: Anxious;Flat affect Overall Cognitive Status: Within Functional Limits for tasks assessed (Slow to respond; decreased safety awareness)                      General Comments      Exercises        Assessment/Plan    PT Assessment Patient needs continued PT services  PT Diagnosis Difficulty walking;Abnormality of gait;Generalized weakness   PT Problem List Decreased strength;Decreased activity tolerance;Decreased balance;Decreased mobility;Decreased knowledge of use of DME;Decreased safety awareness;Cardiopulmonary status limiting activity  PT Treatment Interventions DME instruction;Gait training;Functional mobility training;Therapeutic activities;Therapeutic exercise;Balance training;Patient/family education   PT Goals (Current goals can be found in the Care Plan section) Acute Rehab PT Goals Patient Stated Goal: To get stronger PT Goal Formulation: With patient/family Time For Goal Achievement: 03/11/15 Potential to Achieve Goals: Good    Frequency Min 3X/week   Barriers to discharge Decreased caregiver support Patient does not have 24 hour assist.    Co-evaluation               End of Session Equipment Utilized During Treatment: Gait belt Activity Tolerance: Patient limited by fatigue Patient left: in bed;with call bell/phone within reach;with family/visitor present Nurse Communication: Mobility status         Time: 7106-2694 PT Time Calculation (min) (ACUTE ONLY): 50 min   Charges:   PT Evaluation $Initial PT Evaluation Tier I: 1  Procedure PT Treatments $Therapeutic Activity: 8-22 mins   PT G Codes:        Despina Pole 03-11-2015, 1:12 PM Carita Pian. Sanjuana Kava, [place] Pager [phone]

## 2015-03-05 ENCOUNTER — Telehealth: Payer: Self-pay

## 2015-03-05 DIAGNOSIS — I5031 Acute diastolic (congestive) heart failure: Secondary | ICD-10-CM

## 2015-03-05 DIAGNOSIS — I10 Essential (primary) hypertension: Secondary | ICD-10-CM

## 2015-03-05 LAB — GLUCOSE, CAPILLARY
GLUCOSE-CAPILLARY: 227 mg/dL — AB (ref 70–99)
GLUCOSE-CAPILLARY: 273 mg/dL — AB (ref 70–99)
Glucose-Capillary: 223 mg/dL — ABNORMAL HIGH (ref 70–99)
Glucose-Capillary: 178 mg/dL — ABNORMAL HIGH (ref 70–99)

## 2015-03-05 LAB — URINE CULTURE: Colony Count: 75000

## 2015-03-05 LAB — HEMOGLOBIN A1C
Hgb A1c MFr Bld: 12.4 % — ABNORMAL HIGH (ref 4.8–5.6)
Mean Plasma Glucose: 309 mg/dL

## 2015-03-05 LAB — BASIC METABOLIC PANEL
Anion gap: 12 (ref 5–15)
BUN: 32 mg/dL — AB (ref 6–23)
CHLORIDE: 94 mmol/L — AB (ref 96–112)
CO2: 28 mmol/L (ref 19–32)
Calcium: 8.5 mg/dL (ref 8.4–10.5)
Creatinine, Ser: 1.81 mg/dL — ABNORMAL HIGH (ref 0.50–1.10)
GFR calc Af Amer: 28 mL/min — ABNORMAL LOW (ref >90)
GFR calc non Af Amer: 25 mL/min — ABNORMAL LOW (ref >90)
GLUCOSE: 225 mg/dL — AB (ref 70–99)
Potassium: 3.7 mmol/L (ref 3.5–5.1)
Sodium: 134 mmol/L — ABNORMAL LOW (ref 135–145)

## 2015-03-05 MED ORDER — ACETAMINOPHEN 325 MG PO TABS
650.0000 mg | ORAL_TABLET | Freq: Four times a day (QID) | ORAL | Status: DC | PRN
  Administered 2015-03-05: 650 mg via ORAL
  Filled 2015-03-05: qty 2

## 2015-03-05 MED ORDER — INSULIN ASPART 100 UNIT/ML ~~LOC~~ SOLN: 0.0000 [IU] | Freq: Every day | SUBCUTANEOUS | Status: DC

## 2015-03-05 MED ORDER — INSULIN GLARGINE 100 UNIT/ML ~~LOC~~ SOLN
5.0000 [IU] | Freq: Every day | SUBCUTANEOUS | Status: DC
  Administered 2015-03-05 – 2015-03-06 (×2): 5 [IU] via SUBCUTANEOUS
  Filled 2015-03-05 (×3): qty 0.05

## 2015-03-05 MED ORDER — SENNA 8.6 MG PO TABS
2.0000 | ORAL_TABLET | Freq: Every day | ORAL | Status: DC
  Administered 2015-03-05: 17.2 mg via ORAL
  Filled 2015-03-05 (×3): qty 2

## 2015-03-05 MED ORDER — INSULIN ASPART 100 UNIT/ML ~~LOC~~ SOLN
0.0000 [IU] | Freq: Three times a day (TID) | SUBCUTANEOUS | Status: DC
  Administered 2015-03-06: 3 [IU] via SUBCUTANEOUS
  Administered 2015-03-06: 5 [IU] via SUBCUTANEOUS
  Administered 2015-03-06: 8 [IU] via SUBCUTANEOUS
  Administered 2015-03-07: 3 [IU] via SUBCUTANEOUS

## 2015-03-05 NOTE — Telephone Encounter (Signed)
Unknown female caller left voicemail on Friday at 11:54am requesting records sent to [facility]. Caller did not give name or specifics about which records need to be sent. 2016 DPR has daughter Kelly Neal and son Kelly Neal listed, so the caller may have been her daughter. Please call for clarification. Cb# [id number].

## 2015-03-05 NOTE — Progress Notes (Signed)
Family Medicine Teaching Service Daily Progress Note Intern Pager: [phone]  Patient name: Kelly Neal Medical record number: [id number] Date of birth: 1928-01-28 Age: 81 y.o. Gender: female  Primary Care Provider: Ellsworth Lennox, MD Consultants: Cardiology (HF) Code Status: FULL  Pt Overview and Major Events to Date:  4/16 - admitted, leg swelling with BNP 544, presumed acute-on-chronic CHF, hypertensive urgency with proteinuria 4/17 - BP improved, normal renal US; troponin with very slight increase (?demand); cardiology consulted  Assessment and Plan: Kelly Neal is a 81 y.o. female presenting with LE edema, hypertensive urgency and proteinuria. PMH is significant for T2DM, heart block (s/p pacer placement in 2014), HTN, HLD, and frequent falls.   Acute on chronic diastolic CHF: On admission, Pro-BNP elevated to 544 with subacute LE edema and orthopnea. Troponins neg, LBB EKG - hold lasix per Cr bump, LE edema improved - Daily weights, strict I/O - 136-> 132-> 131 - F/u echocardiogram - hold ACE inhibitor  Proteinuria: - 3192 mg/d 24 hour protein - managing BP (see immediately below)  Elevated Cre - Cr elevated to 1.8<- 1.2. Baseline appears o be 1.2 - Renal US 4/17 normal - hold  Lasix and Ace -f/u renal fucntion in AM   Hypertensive urgency: Improving with initiation of home meds - continue home lisinopril-HCTZ 10-12.5 mg - continue labetalol 10mg  IV q2h PRN    H/o Second degree heart bock ( Mobitz 1): s/p pacemaker June 2014, now pacemaker-dependent.  - continue telemetry monitoring  T2DM:  - f/u A1c  - continue SSI with CBG's qAC+HS, for now  Weakness:  - F/u PT/OT evaluation / recommendations  PAF- stable CHADS Vasc 2score 5 not anticoagulated at home   FEN/GI: Heart healthy/carb modified diet; saline lock IV Prophylaxis: Subcutaneous heparin  Disposition: management as above; final dispo planning pending improvement in BP control and  volume status - potentially may need short-term SNF stay, but pt is hopeful for home health if needed rather than SNF  Subjective:   Feeling improved, denies SOB, chest pain, headache  Objective: Temp:  [97.5 F (36.4 C)-98.3 F (36.8 C)] 98.3 F (36.8 C) (04/17 2143) Pulse Rate:  [68-73] 73 (04/17 2143) Resp:  [18] 18 (04/17 2143) BP: (131-153)/(58-74) 153/74 mmHg (04/17 2143) SpO2:  [94 %-96 %] 95 % (04/17 2143) Weight:  [131 lb 4.8 oz (59.557 kg)] 131 lb 4.8 oz (59.557 kg) (04/17 0604) Physical Exam: General: Very pleasant elderly woman in no distress HEENT: MMM Cardiovascular: RRR without frank murmur, mild JVD Respiratory: Nonlabored breathing, CTAB, on room air Abdomen: soft, nontender, BS+ Extremities: 1+ pitting edema worse on the left Skin: chronic venous stasis skin changes bilaterally, otherwise no wounds or rashes Neuro: Alert and oriented, speech normal    Laboratory:  Recent Labs Lab 03/03/15 1221 03/03/15 1347 03/04/15 0042  WBC 7.0 7.9 7.5  HGB 12.9 13.9 14.5  HCT 37.2 39.7 41.7  PLT 263 231 220    Recent Labs Lab 03/03/15 1221 03/03/15 1347 03/04/15 0042 03/05/15 0403  NA 135 134* 136 134*  K 4.3 4.0 4.0 3.7  CL 99 97 97 94*  CO2 26 26 26 28   BUN 27* 24* 25* 32*  CREATININE 1.15* 1.19* 1.22* 1.81*  CALCIUM 9.0 9.2 9.1 8.5  PROT 6.0  --   --   --   BILITOT 0.9  --   --   --   ALKPHOS 59  --   --   --   ALT 12  --   --   --  AST 11  --   --   --   GLUCOSE 279* 295* 344* 225*     Imaging/Diagnostic Tests: ED EKG: LBBB (not new), no obvious pacer spikes CXR 4/16 @1254 : pacer in place; no acute cardiopulmonary disease Renal US 4/17: normal study  Veatrice Bourbon, MD 03/05/2015, 5:42 AM PGY-1, [place] Intern pager: [phone], text pages welcome

## 2015-03-05 NOTE — Progress Notes (Signed)
Physical Therapy Treatment Patient Details Name: Kelly Neal MRN: [id number] DOB: [birth year] Today's Date: 03/05/2015    History of Present Illness Patient is an 81 yo female admitted 03/03/15 with SOB and LE edema.  Patient with CHF exacerbation and hypertensive urgency.  PMH:  DM, heart block s/p pacemaker, HTN, PAF, frequent falls.    PT Comments    Pt very pleasant and very excited to be up and walking. Pt with significant balance deficits due to posterior lean especially with initial mobility. Pt encouraged to ambulate with nursing and perform HEP. Will continue to follow to maximize safety and independence  Follow Up Recommendations        Equipment Recommendations       Recommendations for Other Services       Precautions / Restrictions Precautions Precautions: Fall Precaution Comments: H/o falls at home Restrictions Weight Bearing Restrictions: No    Mobility  Bed Mobility   Bed Mobility: Supine to Sit     Supine to sit: Supervision;HOB elevated     General bed mobility comments: in chair on arrival  Transfers Overall transfer level: Needs assistance Equipment used: Rolling walker (2 wheeled) Transfers: Sit to/from Stand Sit to Stand: Min assist Stand pivot transfers: Min assist       General transfer comment: verbal cues for hand placement and to rise with anterior translation secondary to posterior lean in sitting and standing   Ambulation/Gait Ambulation/Gait assistance: Min assist Ambulation Distance (Feet): 350 Feet (30' to toilet then 350') Assistive device: Rolling walker (2 wheeled) Gait Pattern/deviations: Step-through pattern;Decreased stride length;Narrow base of support   Gait velocity interpretation: Below normal speed for age/gender General Gait Details: cues for posture, anterior translation, avoiding obstacles and steering RW, without anterior force on back pt unable to maintain anterior translation due to posterior LOB for the  first 50' then improved anterior translation, pt with veering bil throughout gait   Stairs            Wheelchair Mobility    Modified Rankin (Stroke Patients Only)       Balance Overall balance assessment: Needs assistance   Sitting balance-Leahy Scale: Good   Postural control: Posterior lean   Standing balance-Leahy Scale: Poor                      Cognition Arousal/Alertness: Awake/alert Behavior During Therapy: WFL for tasks assessed/performed Overall Cognitive Status: No family/caregiver present to determine baseline cognitive functioning       Memory: Decreased short-term memory              Exercises General Exercises - Lower Extremity Long Arc Quad: AROM;Seated;Both;15 reps Hip Flexion/Marching: AROM;Seated;Both;15 reps    General Comments        Pertinent Vitals/Pain Pain Assessment: No/denies pain    Home Living Family/patient expects to be discharged to:: Private residence Living Arrangements: Alone;Children (son has autism, but is high functioning) Available Help at Discharge: Family;Available PRN/intermittently Type of Home: House Home Access: Level entry   Home Layout: One level Home Equipment: Walker - 2 wheels;Cane - single point;Toilet riser;Shower seat;Grab bars - tub/shower;Hand held shower head      Prior Function Level of Independence: Independent with assistive device(s);Needs assistance  Gait / Transfers Assistance Needed: Recently progressed from cane to RW.  Mult falls. ADL's / Homemaking Assistance Needed: Recently is requiring assist with bathing/dressing and meals.     PT Goals (current goals can now be found in the care plan section) Acute  Rehab PT Goals Patient Stated Goal: To get stronger Progress towards PT goals: Progressing toward goals    Frequency       PT Plan Current plan remains appropriate    Co-evaluation             End of Session Equipment Utilized During Treatment: Gait  belt Activity Tolerance: Patient tolerated treatment well Patient left: in chair;with call bell/phone within reach     Time: 1053-1117 PT Time Calculation (min) (ACUTE ONLY): 24 min  Charges:  $Gait Training: 8-22 mins $Therapeutic Exercise: 8-22 mins                    G Codes:      Melford Aase 2015-03-24, 11:49 AM Elwyn Reach, [place]

## 2015-03-05 NOTE — Care Management Note (Signed)
    Page 1 of 1   03/07/2015     11:51:57 AM CARE MANAGEMENT NOTE 03/07/2015  Patient:  Kelly Neal, Kelly Neal   Account Number:  [id number]  Date Initiated:  03/05/2015  Documentation initiated by:  Tiger Spieker  Subjective/Objective Assessment:   Pt adm on 03/03/15 with CHF exacerbation.  PTA, pt resides at home autistic son.     Action/Plan:   PT/OT recommending SNF at dc for rehab.  CSW consulted to facilitate dc to SNF when medically stable.   Anticipated DC Date:  03/08/2015   Anticipated DC Plan:  SKILLED NURSING FACILITY  In-house referral  Clinical Social Worker      DC Planning Services  CM consult      Choice offered to / List presented to:             Status of service:  Completed, signed off Medicare Important Message given?  YES (If response is "NO", the following Medicare IM given date fields will be blank) Date Medicare IM given:  03/06/2015 Medicare IM given by:  Sammie Denner Date Additional Medicare IM given:   Additional Medicare IM given by:    Discharge Disposition:  [place]  Per UR Regulation:  Reviewed for med. necessity/level of care/duration of stay  If discussed at [place] of Stay Meetings, dates discussed:    Comments:  03/07/15 Ellan Lambert, RN, BSN [phone] Pt discharging to SNF today, per CSW arrangements.

## 2015-03-05 NOTE — Progress Notes (Signed)
Subjective: No CP  NO SOB at rest   Objective: Filed Vitals:   03/04/15 0604 03/04/15 1500 03/04/15 2143 03/05/15 0550  BP: 131/60 145/58 153/74 158/69  Pulse: 70 68 73 72  Temp: 98.1 F (36.7 C) 97.5 F (36.4 C) 98.3 F (36.8 C) 97.8 F (36.6 C)  TempSrc: Oral Oral Oral Oral  Resp: 18 18 18 17   Height:      Weight: 131 lb 4.8 oz (59.557 kg)     SpO2: 96% 94% 95% 95%   Weight change:   Intake/Output Summary (Last 24 hours) at 03/05/15 0755 Last data filed at 03/05/15 0645  Gross per 24 hour  Intake    360 ml  Output   2050 ml  Net  -1690 ml   Net I/O  2.86 L    General: Alert, awake, oriented x3, in no acute distress Neck:  JVP is normal Heart: Regular rate and rhythm, without murmurs, rubs, gallops.  Lungs:Rales at bases   Exemities:  Tr   edema.   Neuro: Grossly intact, nonfocal.  Tel  SR    Lab Results: Results for orders placed or performed during the hospital encounter of 03/03/15 (from the past 24 hour(s))  Troponin I     Status: None   Collection Time: 03/04/15 10:04 AM  Result Value Ref Range   Troponin I 0.03 <0.031 ng/mL  Glucose, capillary     Status: Abnormal   Collection Time: 03/04/15 11:10 AM  Result Value Ref Range   Glucose-Capillary 216 (H) 70 - 99 mg/dL   Comment 1 Notify RN    Comment 2 Document in Chart   Glucose, capillary     Status: Abnormal   Collection Time: 03/04/15  4:22 PM  Result Value Ref Range   Glucose-Capillary 199 (H) 70 - 99 mg/dL   Comment 1 Notify RN    Comment 2 Document in Chart   Glucose, capillary     Status: Abnormal   Collection Time: 03/04/15  9:35 PM  Result Value Ref Range   Glucose-Capillary 246 (H) 70 - 99 mg/dL   Comment 1 Notify RN    Comment 2 Document in Chart   Basic metabolic panel     Status: Abnormal   Collection Time: 03/05/15  4:03 AM  Result Value Ref Range   Sodium 134 (L) 135 - 145 mmol/L   Potassium 3.7 3.5 - 5.1 mmol/L   Chloride 94 (L) 96 - 112 mmol/L   CO2 28 19 - 32 mmol/L   Glucose, Bld 225 (H) 70 - 99 mg/dL   BUN 32 (H) 6 - 23 mg/dL   Creatinine, Ser 1.81 (H) 0.50 - 1.10 mg/dL   Calcium 8.5 8.4 - 10.5 mg/dL   GFR calc non Af Amer 25 (L) >90 mL/min   GFR calc Af Amer 28 (L) >90 mL/min   Anion gap 12 5 - 15  Glucose, capillary     Status: Abnormal   Collection Time: 03/05/15  5:48 AM  Result Value Ref Range   Glucose-Capillary 227 (H) 70 - 99 mg/dL   Comment 1 Notify RN    Comment 2 Document in Chart     Studies/Results: US Renal  03/04/2015   CLINICAL DATA:  Proteinuria, hypertension.  EXAM: RENAL/URINARY TRACT ULTRASOUND COMPLETE  COMPARISON:  None.  FINDINGS: Right Kidney:  Length: 12.9 cm. Echogenicity within normal limits. No mass or hydronephrosis visualized.  Left Kidney:  Length: 11.4 cm. Echogenicity within normal limits. No mass or hydronephrosis  visualized.  Bladder:  Appears normal for degree of bladder distention.  IMPRESSION: Normal renal ultrasound.   Electronically Signed   By: Jeb Levering M.D.   On: 03/04/2015 01:46    Medications:   Reviewed   @PROBHOSP @  1  HTN  Not optimally controlled yet    2.  CHF Echo pending  Diursing though Cr has bumped  Would hold furhter lasix for now. Hold ACE I  Reassess renal function in AM    3.  PPM    4  PAF  ONe brief episode  Followed by M Croituru  CHADS Vasc 2  Not on anticoag       LOS: 2 days   Dorris Carnes 03/05/2015, 7:55 AM

## 2015-03-05 NOTE — Evaluation (Signed)
Occupational Therapy Evaluation Patient Details Name: Kelly Neal MRN: [id number] DOB: 1928/05/09 Today's Date: 03/05/2015    History of Present Illness Patient is an 81 yo female admitted 03/03/15 with SOB and LE edema.  Patient with CHF exacerbation and hypertensive urgency.  PMH:  DM, heart block s/p pacemaker, HTN, PAF, frequent falls.   Clinical Impression   Pt was living with her son who has autism with support of her daughter as she does not drive.  Pt with hx of falls and recently started using a RW her daughter bought.  Pt was able to perform self care and light meal prep prior to admission. Presents with impaired cognition, poor balance, and generalized weakness interfering with ability to perform ADL and ADL transfers.  Pt is concerned about being a burden to her family and is agreeable to ST rehab in SNF.  Will follow acutely.   Follow Up Recommendations  SNF;Supervision/Assistance - 24 hour    Equipment Recommendations  None recommended by OT    Recommendations for Other Services       Precautions / Restrictions Precautions Precautions: Fall Precaution Comments: H/o falls at home Restrictions Weight Bearing Restrictions: No      Mobility Bed Mobility   Bed Mobility: Supine to Sit     Supine to sit: Supervision;HOB elevated     General bed mobility comments: extra time, use of rail  Transfers Overall transfer level: Needs assistance Equipment used: Rolling walker (2 wheeled) Transfers: Sit to/from [facility] Sit to Stand: Min assist Stand pivot transfers: Min assist       General transfer comment: verbal cues for hand placement and to rise    Balance Overall balance assessment: Needs assistance   Sitting balance-Leahy Scale: Fair   Postural control: Posterior lean   Standing balance-Leahy Scale: Poor                              ADL Overall ADL's : Needs assistance/impaired Eating/Feeding: Set up;Bed level    Grooming: Sitting;Set up;Oral care   Upper Body Bathing: Minimal assitance;Sitting   Lower Body Bathing: Minimal assistance;Sit to/from stand   Upper Body Dressing : Set up;Sitting   Lower Body Dressing: Minimal assistance;Sit to/from stand   Toilet Transfer: Minimal assistance;Stand-pivot   Toileting- Clothing Manipulation and Hygiene: Minimal assistance;Sit to/from stand         General ADL Comments: able to donn socks without bending over, posterior lean in standing     Vision     Perception     Praxis      Pertinent Vitals/Pain Pain Assessment: No/denies pain     Hand Dominance Right   Extremity/Trunk Assessment Upper Extremity Assessment Upper Extremity Assessment: Generalized weakness   Lower Extremity Assessment Lower Extremity Assessment: Defer to PT evaluation   Cervical / Trunk Assessment Cervical / Trunk Assessment: Kyphotic   Communication Communication Communication: No difficulties   Cognition Arousal/Alertness: Awake/alert Behavior During Therapy: WFL for tasks assessed/performed Overall Cognitive Status: No family/caregiver present to determine baseline cognitive functioning       Memory: Decreased short-term memory             General Comments       Exercises       Shoulder Instructions      Home Living Family/patient expects to be discharged to:: Private residence Living Arrangements: Alone;Children (son has autism, but is high functioning) Available Help at Discharge: Family;Available PRN/intermittently Type of  Home: House Home Access: Level entry     Home Layout: One level     Bathroom Shower/Tub: Occupational psychologist: Standard     Home Equipment: Environmental consultant - 2 wheels;Cane - single point;Toilet riser;Shower seat;Grab bars - tub/shower;Hand held shower head          Prior Functioning/Environment Level of Independence: Independent with assistive device(s);Needs assistance  Gait / Transfers  Assistance Needed: Recently progressed from cane to RW.  Mult falls. ADL's / Homemaking Assistance Needed: Recently is requiring assist with bathing/dressing and meals.        OT Diagnosis: Generalized weakness;Cognitive deficits   OT Problem List: Decreased strength;Decreased activity tolerance;Impaired balance (sitting and/or standing);Decreased cognition;Decreased safety awareness;Decreased knowledge of use of DME or AE;Increased edema   OT Treatment/Interventions: Self-care/ADL training;DME and/or AE instruction;Patient/family education;Balance training;Therapeutic activities;Cognitive remediation/compensation    OT Goals(Current goals can be found in the care plan section) Acute Rehab OT Goals Patient Stated Goal: To get stronger OT Goal Formulation: With patient Time For Goal Achievement: 03/19/15 Potential to Achieve Goals: Good ADL Goals Pt Will Perform Grooming: with supervision;standing Pt Will Perform Upper Body Bathing: with supervision;sitting Pt Will Perform Lower Body Bathing: with supervision;sit to/from stand Pt Will Perform Upper Body Dressing: with supervision;sitting Pt Will Perform Lower Body Dressing: with supervision;sit to/from stand Pt Will Transfer to Toilet: with supervision;ambulating;bedside commode Pt Will Perform Toileting - Clothing Manipulation and hygiene: with supervision;sit to/from stand  OT Frequency: Min 2X/week   Barriers to D/C:            Co-evaluation              End of Session Equipment Utilized During Treatment: Gait belt;Rolling walker  Activity Tolerance: Patient tolerated treatment well Patient left: in chair;with call bell/phone within reach   Time: 0263-7858 OT Time Calculation (min): 56 min Charges:  OT General Charges $OT Visit: 1 Procedure OT Evaluation $Initial OT Evaluation Tier I: 1 Procedure OT Treatments $Self Care/Home Management : 38-52 mins G-Codes:    Malka So 03/05/2015, 9:37 AM   [phone]

## 2015-03-05 NOTE — Progress Notes (Signed)
Inpatient Diabetes Program Recommendations  AACE/ADA: New Consensus Statement on Inpatient Glycemic Control (2013)  Target Ranges:  Prepandial:   less than 140 mg/dL      Peak postprandial:   less than 180 mg/dL (1-2 hours)      Critically ill patients:  140 - 180 mg/dL    Results for KENDI, DEFALCO (MRN [id number]) as of 03/05/2015 10:33  Ref. Range 03/04/2015 06:15 03/04/2015 11:10 03/04/2015 16:22 03/04/2015 21:35 03/05/2015 05:48  Glucose-Capillary Latest Ref Range: 70-99 mg/dL 303 (H) 216 (H) 199 (H) 246 (H) 227 (H)   Reason for Visit: CHF  Diabetes history: DM 2 Outpatient Diabetes medications: Pt reported not taking Amaryl 2mg  Daily, Metformin 250 mg QAM, 500 mg QPM Current orders for Inpatient glycemic control: Novolog 0-15 units TID  Inpatient Diabetes Program Recommendations Insulin - Basal: Fasting glucose was in the 200's this am, 300's yesterday. Please consider ordering Levemir 6 units Q24 hrs.  Thanks,  Tama Headings RN, MSN, [facility] Inpatient Diabetes Coordinator Team Pager [phone]

## 2015-03-06 DIAGNOSIS — I5021 Acute systolic (congestive) heart failure: Secondary | ICD-10-CM

## 2015-03-06 DIAGNOSIS — Z950 Presence of cardiac pacemaker: Secondary | ICD-10-CM

## 2015-03-06 DIAGNOSIS — E785 Hyperlipidemia, unspecified: Secondary | ICD-10-CM

## 2015-03-06 LAB — BASIC METABOLIC PANEL
Anion gap: 9 (ref 5–15)
BUN: 34 mg/dL — AB (ref 6–23)
CO2: 28 mmol/L (ref 19–32)
Calcium: 8.7 mg/dL (ref 8.4–10.5)
Chloride: 94 mmol/L — ABNORMAL LOW (ref 96–112)
Creatinine, Ser: 1.54 mg/dL — ABNORMAL HIGH (ref 0.50–1.10)
GFR calc Af Amer: 35 mL/min — ABNORMAL LOW (ref >90)
GFR, EST NON AFRICAN AMERICAN: 30 mL/min — AB (ref >90)
GLUCOSE: 290 mg/dL — AB (ref 70–99)
POTASSIUM: 3.9 mmol/L (ref 3.5–5.1)
Sodium: 131 mmol/L — ABNORMAL LOW (ref 135–145)

## 2015-03-06 LAB — URINALYSIS, ROUTINE W REFLEX MICROSCOPIC
BILIRUBIN URINE: NEGATIVE
GLUCOSE, UA: 500 mg/dL — AB
HGB URINE DIPSTICK: NEGATIVE
KETONES UR: NEGATIVE mg/dL
Nitrite: NEGATIVE
PROTEIN: 100 mg/dL — AB
Specific Gravity, Urine: 1.015 (ref 1.005–1.030)
UROBILINOGEN UA: 0.2 mg/dL (ref 0.0–1.0)
pH: 5 (ref 5.0–8.0)

## 2015-03-06 LAB — URINE MICROSCOPIC-ADD ON

## 2015-03-06 LAB — SODIUM, URINE, RANDOM: SODIUM UR: 22 mmol/L

## 2015-03-06 LAB — GLUCOSE, CAPILLARY
GLUCOSE-CAPILLARY: 186 mg/dL — AB (ref 70–99)
GLUCOSE-CAPILLARY: 221 mg/dL — AB (ref 70–99)
GLUCOSE-CAPILLARY: 177 mg/dL — AB (ref 70–99)
Glucose-Capillary: 296 mg/dL — ABNORMAL HIGH (ref 70–99)

## 2015-03-06 LAB — CREATININE, URINE, RANDOM: Creatinine, Urine: 102.4 mg/dL

## 2015-03-06 MED ORDER — CARVEDILOL 3.125 MG PO TABS
1.6250 mg | ORAL_TABLET | Freq: Two times a day (BID) | ORAL | Status: DC
  Administered 2015-03-06 – 2015-03-07 (×2): 1.5625 mg via ORAL
  Filled 2015-03-06 (×4): qty 0.5

## 2015-03-06 MED ORDER — RIVAROXABAN 15 MG PO TABS: 15.0000 mg | ORAL_TABLET | Freq: Every day | ORAL | Status: DC

## 2015-03-06 MED ORDER — RIVAROXABAN 15 MG PO TABS
15.0000 mg | ORAL_TABLET | Freq: Every day | ORAL | Status: DC
  Administered 2015-03-06: 15 mg via ORAL
  Filled 2015-03-06: qty 1

## 2015-03-06 MED ORDER — POLYETHYLENE GLYCOL 3350 17 G PO PACK
17.0000 g | PACK | Freq: Every day | ORAL | Status: DC
  Administered 2015-03-06 – 2015-03-07 (×2): 17 g via ORAL
  Filled 2015-03-06 (×2): qty 1

## 2015-03-06 MED ORDER — RIVAROXABAN 15 MG PO TABS
15.0000 mg | ORAL_TABLET | Freq: Every day | ORAL | Status: DC
  Filled 2015-03-06: qty 1

## 2015-03-06 MED ORDER — SENNOSIDES-DOCUSATE SODIUM 8.6-50 MG PO TABS
1.0000 | ORAL_TABLET | Freq: Every day | ORAL | Status: DC
  Administered 2015-03-06: 1 via ORAL
  Filled 2015-03-06 (×2): qty 1

## 2015-03-06 NOTE — Progress Notes (Signed)
BSW Intern talked with pt and daughter [name] at bedside. Bed offers given to daughter and will decide on a facility today. Contact number given to daughter for social work.  [place] Intern [phone]

## 2015-03-06 NOTE — Clinical Documentation Improvement (Signed)
Presents with Acute on Chronic Diastolic CHF.   Patient with abnormal lab values  Creatinine has increased from 1.15 on day of admission to 1.81 this morning  This is a 0.66 increase in 48 hours  Please provide a diagnosis associated with the above clinical indicators and document findings in next progress note and include in discharge summary if applicable.  Acute Renal Failure/Acute Kidney Injury Acute on Chronic Renal Failure - stage if known Chronic Renal Failure - stage if known Other Condition Cannot Clinically Determine   Thank You, Zoila Shutter ,RN Clinical Documentation Specialist:  [place] Information Management

## 2015-03-06 NOTE — Clinical Social Work Psychosocial (Addendum)
Clinical Social Work Department BRIEF PSYCHOSOCIAL ASSESSMENT 03/05/2015  Patient:  Kelly Neal, Kelly Neal     Account Number:  [id number]     Admit date:  03/03/2015  Clinical Social Worker:  Elam Dutch  Date/Time:  03/05/2015 12:35 PM  Referred by:  Physician  Date Referred:  03/05/2015 Referred for  SNF Placement   Other Referral:   Interview type:  Other - See comment Other interview type:   Patient and daughter 62    PSYCHOSOCIAL DATA Living Status:  FAMILY Admitted from facility:   Level of care:   Primary support name:  [name] Pegram  [phone] Primary support relationship to patient:  CHILD, ADULT Degree of support available:   Strong support    *Patient's autistic son lives with patient. She states that he is mostly independent of his ADL's and she provides supportive care only. His siblings are checking on him while she is in the hospital    CURRENT CONCERNS Current Concerns  Post-Acute Placement   Other Concerns:    SOCIAL WORK ASSESSMENT / PLAN Patient was referred to CSW for short term SNF per PT's recommendation. CSW met with patient and daughter [name] this afternoon to discuss bed search process and d/c options.  Patient is very willing to seek SNF as is her daughter.  They would prefer the [facility] area if possible- [employer]. CSW will initiate bed search and notify patient and daughter of bed offers. Fl2 to be completed and placed on chart for MD's signature.   Assessment/plan status:  Psychosocial Support/Ongoing Assessment of Needs Other assessment/ plan:   Information/referral to community resources:   SNF bed list provided to patient/daughter for review.    PATIENT'S/FAMILY'S RESPONSE TO PLAN OF CARE: Patient is alert, oriented and extremely pleasant. She stated that she had hoped to return home but agrees that she would benefit from short term SNF as she is still "weak but getting stronger everday."  She was very receptive to SNF search  and requested that her daughter [name] be involved in decision making for SNF bed. Active bed search is in place.  Lorie Phenix.[place], [place]

## 2015-03-06 NOTE — Discharge Summary (Signed)
[facility] Discharge Summary  Patient name: Kelly Neal Medical record number: [id number] Date of birth: 03/23/1928 Age: 81 y.o. Gender: female Date of Admission: 03/03/2015  Date of Discharge: 03/07/2015  Admitting Physician: Dickie La, MD  Primary Care Provider: Ellsworth Lennox, MD Consultants: nephrology, Cardiology  Indication for Hospitalization:  New onset CHF Acute on chronic kidney disease  Discharge Diagnoses/Problem List:  Patient Active Problem List   Diagnosis Date Noted  . Elevated troponin   . Peripheral edema   . Proteinuria   . Acute CHF 03/03/2015  . Paroxysmal atrial fibrillation 09/05/2014  . Dizzy spells 05/26/2013  . Cardiac pacemaker in situ-St Jude implanted 04/20/13 04/21/2013  . Second degree atrioventricular block, Mobitz (type) I 04/19/2013  . Syncope 04/19/2013  . Symptomatic bradycardia 04/19/2013  . Hyperlipidemia   . Hypertension   . DM type 2 (diabetes mellitus, type 2)      Disposition: To inpatient rehabilitation facility  Discharge Condition: Stable  Discharge Exam:   Temp: [97.7 F (36.5 C)-98.5 F (36.9 C)] 98.5 F (36.9 C) (04/20 0542) Pulse Rate: [64-71] 70 (04/20 0542) Resp: [18-20] 18 (04/20 0542) BP: (127-162)/(58-80) 127/58 mmHg (04/20 0542) SpO2: [96 %-99 %] 99 % (04/20 0542) Weight: [131 lb 1.6 oz (59.467 kg)-131 lb 9.6 oz (59.693 kg)] 131 lb 1.6 oz (59.467 kg) (04/20 0357) Physical Exam: General: Very pleasant elderly woman in no distress HEENT: MMM Cardiovascular: RRR without frank murmur Respiratory: Nonlabored breathing, CTAB, on room air Abdomen: soft, nontender, BS+ Extremities: 1+ pitting edema worse on the left Skin: chronic venous stasis skin changes bilaterally, otherwise no wounds or rashes Neuro: Alert and oriented, speech normal  Brief Hospital Course:   Kelly Neal is a 81 y.o. female presenting with LE edema, hypertensive urgency and proteinuria. PMH  is significant for T2DM, heart block (s/p pacer placement in 2014), HTN, HLD, and frequent falls.    Acute on chronic systolic/diastolic CHF:   On admission she was noted to have 3+ pitting LE edema which had slowly increased in the weeks to months leading up to admission. Her abs were significant for an elevated Creatinine to 1.8 from her baseline 1.2. She was seen by cardiology for concern of worsening heart function. Her EKG and troponins were normal. Her admission BNP was 544. Se ha da echocardiogram which showed decreased ejection fraction to 40%. She was diurese with lasix with a weight loss from 136-131 and marked improvement of her lower extremity edema. Coreg 1.5 was initiated and tolerated well   As her labs were significant elevated Creatinine a 24 hour protein was collected and was 3192 mg/day.  Lasix was then held as well as her home lisinopril for concern of nephrotoxicity.  Nephology was consulted and evaluated her. Testing was significant for a FeNa consistent with prerenal cause of kidney injury, negative ASO, negative complement testing. Creatinine continued to improve with inpatient. And as it imporved she was restarted on Lasix 40 BID with potassium replacement  Her blood pressures were managed with PRN labetalol 10 mg IV q2hrs but remained stable with need for use of labetalol  As she had a CHADSVASC score of 5 she was started on Xarelto 15 daily which was well tolerated  Physical therapy and Occupational therapy also assessed her a she had weakness when she presented and the recommendation was made for inpatient rehabilitation.     Issues for Follow Up:  1. Blood pressure control 2. Renal Function 3. Diabetes control consider altering medication  regimen as her A1c inpatient is 12.7 4. CHF- follow volume status   Significant Procedures:  -Echocardiogram    Significant Labs and Imaging:   Recent Labs Lab 03/03/15 1221 03/03/15 1347 03/04/15 0042  WBC 7.0 7.9 7.5   HGB 12.9 13.9 14.5  HCT 37.2 39.7 41.7  PLT 263 231 220    Recent Labs Lab 03/03/15 1221 03/03/15 1347 03/04/15 0042 03/05/15 0403 03/06/15 0925  NA 135 134* 136 134* 131*  K 4.3 4.0 4.0 3.7 3.9  CL 99 97 97 94* 94*  CO2 26 26 26 28 28   GLUCOSE 279* 295* 344* 225* 290*  BUN 27* 24* 25* 32* 34*  CREATININE 1.15* 1.19* 1.22* 1.81* 1.54*  CALCIUM 9.0 9.2 9.1 8.5 8.7  ALKPHOS 59  --   --   --   --   AST 11  --   --   --   --   ALT 12  --   --   --   --   ALBUMIN 3.2*  --   --   --   --       Results/Tests Pending at Time of Discharge:  Urine Immunofixation Total urine complement Protein electrophoresis  Discharge Medications:    Medication List    ASK your doctor about these medications        aspirin EC 81 MG tablet  Take 1 tablet (81 mg total) by mouth daily.     glimepiride 2 MG tablet  Commonly known as:  AMARYL  Take 1 tablet (2 mg total) by mouth daily before breakfast.     lisinopril-hydrochlorothiazide 10-12.5 MG per tablet  Commonly known as:  PRINZIDE,ZESTORETIC  Take 0.5 tablets by mouth 2 (two) times daily.     metFORMIN 500 MG 24 hr tablet  Commonly known as:  GLUCOPHAGE-XR  Take 1/2 tablet in the morning, 1 tablet in the evening        Discharge Instructions: Please refer to Patient Instructions section of EMR for full details.  Patient was counseled important signs and symptoms that should prompt return to medical care, changes in medications, dietary instructions, activity restrictions, and follow up appointments.   Follow-Up Appointments:   Veatrice Bourbon, MD 03/06/2015, 12:43 PM PGY-1, [place]

## 2015-03-06 NOTE — Progress Notes (Signed)
Family Medicine Teaching Service Daily Progress Note Intern Pager: [phone]  Patient name: Kelly Neal Medical record number: [id number] Date of birth: [birth year] Age: 81 y.o. Gender: female  Primary Care Provider: Ellsworth Lennox, MD Consultants: Cardiology (HF) Code Status: FULL  Pt Overview and Major Events to Date:  4/16 - admitted, leg swelling with BNP 544, presumed acute-on-chronic CHF, hypertensive urgency with proteinuria 4/17 - BP improved, normal renal US; troponin with very slight increase (?demand); cardiology consulted 4/19: 24 hour  Protein elevated nephro consulted  Assessment and Plan: Kelly Neal is a 81 y.o. female presenting with LE edema, hypertensive urgency and proteinuria. PMH is significant for T2DM, heart block (s/p pacer placement in 2014), HTN, HLD, and frequent falls.   Acute on chronic systolic/diastolic CHF: On admission, Pro-BNP elevated to 544 with subacute LE edema and orthopnea. Troponins neg, LBB EKG - hold lasix per Cr bump, LE edema improved - Daily weights, strict I/O - 136-> 132-> 131, f/u today's weight - F/u echocardiogram - hold ACE inhibitor  Proteinuria: - 3192 mg/d 24 hour protein - managing BP (see immediately below)  Acute systolic/diastolic CHF- EF 78% -cards following appreciate recs - continue coreg 1.5  Acute on Chronic Kidney disease: Likley in setting of poorly controlled blood pressures for  - Cr elevated to 1.8<- 1.2. Baseline appears o be 1.2 - Renal US 4/17 normal - hold  Lasix and Ace - f/u BMP in today - Renal consulted  Hypertensive urgency: Improved - continue labetalol 10mg  IV q2h PRN - consider adding hydralazine 25 mg if blood pressures remain elevated and has increased requirement og PRN labetalol    H/o Second degree heart bock ( [place] 1): s/p pacemaker June 2014, now pacemaker-dependent.  - continue telemetry monitoring  T2DM:  - f/u A1c  - continue SSI with CBG's qAC+HS, for  now  Weakness:  - F/u PT/OT evaluation / recommendations  PAF- stable CHADS Vasc 2score 5 not anticoagulated at home - will start xarelto 15 mg qD per pharmacy   FEN/GI: Heart healthy/carb modified diet; saline lock IV Prophylaxis: Subcutaneous heparin  Disposition: management as above; final dispo planning pending improvement in BP control and volume status - potentially may need short-term SNF stay, but pt is hopeful for home health if needed rather than SNF  Subjective:   Feeling improved, denies SOB, chest pain, headache  Objective: Temp:  [97.6 F (36.4 C)-97.7 F (36.5 C)] 97.6 F (36.4 C) (04/19 0506) Pulse Rate:  [66-70] 69 (04/19 0506) Resp:  [17-18] 18 (04/19 0506) BP: (124-137)/(55-68) 134/55 mmHg (04/19 0506) SpO2:  [95 %-99 %] 95 % (04/19 0506) Physical Exam: General: Very pleasant elderly woman in no distress HEENT: MMM Cardiovascular: RRR without frank murmur, mild JVD Respiratory: Nonlabored breathing, CTAB, on room air Abdomen: soft, nontender, BS+ Extremities: 1+ pitting edema worse on the left Skin: chronic venous stasis skin changes bilaterally, otherwise no wounds or rashes Neuro: Alert and oriented, speech normal    Laboratory:  Recent Labs Lab 03/03/15 1221 03/03/15 1347 03/04/15 0042  WBC 7.0 7.9 7.5  HGB 12.9 13.9 14.5  HCT 37.2 39.7 41.7  PLT 263 231 220    Recent Labs Lab 03/03/15 1221 03/03/15 1347 03/04/15 0042 03/05/15 0403  NA 135 134* 136 134*  K 4.3 4.0 4.0 3.7  CL 99 97 97 94*  CO2 26 26 26 28   BUN 27* 24* 25* 32*  CREATININE 1.15* 1.19* 1.22* 1.81*  CALCIUM 9.0 9.2 9.1 8.5  PROT 6.0  --   --   --  BILITOT 0.9  --   --   --   ALKPHOS 59  --   --   --   ALT 12  --   --   --   AST 11  --   --   --   GLUCOSE 279* 295* 344* 225*     Imaging/Diagnostic Tests: ED EKG: LBBB (not new), no obvious pacer spikes CXR 4/16 @1254 : pacer in place; no acute cardiopulmonary disease Renal US 4/17: normal study  Veatrice Bourbon, MD 03/06/2015, 8:42 AM PGY-1, [place] Intern pager: [phone], text pages welcome

## 2015-03-06 NOTE — Consult Note (Signed)
Reason for Consult:AKI/CKD Referring Physician: Nori Riis, MD  Kelly Neal is an 81 y.o. female.  HPI: Pt is an 81yo WF with PMH significant for HTN, DM, P A fib s/p permanent pacemaker who stopped taking all of her medications about 2 years ago for unclear reasons.  She presented to [facility] on 03/03/15 with a 2 week h/o worsening lower extremity edema and fatigue/weakness.  She was noted to have a markedly elevated BP and she was admitted for urgent HTN.  We were asked to see the patient due to the development of AKI/CKD as well as proteinuria.  Of note, she had been taking lisinopril-HCTZ in the past and was restarted on this while an inpatient but then discontinued after her Scr rose to 1.8.  A renal US was performed and no evidence of hydro/obstruction noted.  Her 24 hour urine protein was significant only for 3.1grams/24 hours and ;Hgb A1c was 12%.    Trend in Creatinine:  CREATININE, SER  Date/Time Value Ref Range Status  03/06/2015 09:25 AM 1.54* 0.50 - 1.10 mg/dL Final  03/05/2015 04:03 AM 1.81* 0.50 - 1.10 mg/dL Final  03/04/2015 12:42 AM 1.22* 0.50 - 1.10 mg/dL Final  03/03/2015 01:47 PM 1.19* 0.50 - 1.10 mg/dL Final  03/03/2015 12:21 PM 1.15* 0.50 - 1.10 mg/dL Final  07/22/2013 05:35 PM 1.22* 0.50 - 1.10 mg/dL Final  05/25/2013 10:05 AM 1.29* 0.50 - 1.10 mg/dL Final  04/19/2013 07:57 PM 0.92 0.50 - 1.10 mg/dL Final  04/19/2013 05:09 PM 0.97 0.50 - 1.10 mg/dL Final    PMH:   Past Medical History  Diagnosis Date  . Hyperlipidemia   . Hypertension   . DM type 2 (diabetes mellitus, type 2)   . Paroxysmal atrial fibrillation 09/05/2014    PSH:   Past Surgical History  Procedure Laterality Date  . Cataract extraction    . Permanent pacemaker insertion N/A 04/20/2013    Procedure: PERMANENT PACEMAKER INSERTION;  Surgeon: Evans Lance, MD;  Location: [facility] CATH LAB;  Service: Cardiovascular;  Laterality: N/A;    Allergies: No Known Allergies  Medications:   Prior to Admission  medications   Medication Sig Start Date End Date Taking? Authorizing Provider  aspirin EC 81 MG tablet Take 1 tablet (81 mg total) by mouth daily. Patient not taking: Reported on 03/03/2015 09/05/14   Sanda Klein, MD  glimepiride (AMARYL) 2 MG tablet Take 1 tablet (2 mg total) by mouth daily before breakfast. Patient not taking: Reported on 03/03/2015 01/06/13   Barton Fanny, MD  lisinopril-hydrochlorothiazide (PRINZIDE,ZESTORETIC) 10-12.5 MG per tablet Take 0.5 tablets by mouth 2 (two) times daily. Patient not taking: Reported on 03/03/2015 11/17/13   Chelle S Jeffery, PA-C  metFORMIN (GLUCOPHAGE-XR) 500 MG 24 hr tablet Take 1/2 tablet in the morning, 1 tablet in the evening Patient not taking: Reported on 03/03/2015 11/17/13   Fara Chute, PA-C    Inpatient medications: . aspirin  81 mg Oral Daily  . carvedilol  1.5625 mg Oral BID WC  . insulin aspart  0-15 Units Subcutaneous TID WC  . insulin aspart  0-5 Units Subcutaneous QHS  . insulin glargine  5 Units Subcutaneous QHS  . Rivaroxaban  15 mg Oral Daily  . senna  2 tablet Oral QHS  . sodium chloride  3 mL Intravenous Q12H    Discontinued Meds:   Medications Discontinued During This Encounter  Medication Reason  . lisinopril-hydrochlorothiazide (PRINZIDE,ZESTORETIC) 10-12.5 MG per tablet 0.5 tablet Entry Error  . lisinopril (PRINIVIL,ZESTRIL)  tablet 10 mg   . hydrochlorothiazide (MICROZIDE) capsule 12.5 mg   . furosemide (LASIX) injection 40 mg   . lisinopril (PRINIVIL,ZESTRIL) tablet 10 mg   . insulin aspart (novoLOG) injection 0-15 Units   . sodium chloride 0.9 % injection 3 mL   . Rivaroxaban (XARELTO) tablet 15 mg Entry Error  . heparin injection 5,000 Units Discontinued by provider  . Rivaroxaban (XARELTO) tablet 15 mg     Social History:  reports that she quit smoking about 64 years ago. Her smoking use included Cigarettes. She quit after 2 years of use. She does not have any smokeless tobacco history on file. She  reports that she does not drink alcohol or use illicit drugs.  Family History:   Family History  Problem Relation Age of Onset  . Hypertension Mother   . Heart attack Father     Pertinent items are noted in HPI. Weight change:   Intake/Output Summary (Last 24 hours) at 03/06/15 1328 Last data filed at 03/06/15 1100  Gross per 24 hour  Intake   1090 ml  Output   2350 ml  Net  -1260 ml   BP 137/59 mmHg  Pulse 64  Temp(Src) 97.6 F (36.4 C) (Oral)  Resp 18  Ht 5\' 8"  (1.727 m)  Wt 59.693 kg (131 lb 9.6 oz)  BMI 20.01 kg/m2  SpO2 95% Filed Vitals:   03/05/15 1502 03/05/15 2104 03/06/15 0506 03/06/15 1002  BP: 124/68 137/61 134/55 137/59  Pulse: 70 66 69 64  Temp:  97.7 F (36.5 C) 97.6 F (36.4 C)   TempSrc:  Oral Oral   Resp: 18 17 18    Height:      Weight:    59.693 kg (131 lb 9.6 oz)  SpO2: 99% 97% 95%      General appearance: alert, cooperative, appears stated age and no distress Head: Normocephalic, without obvious abnormality, atraumatic Eyes: negative findings: lids and lashes normal, conjunctivae and sclerae normal and corneas clear Neck: no adenopathy, no carotid bruit, no JVD, supple, symmetrical, trachea midline and thyroid not enlarged, symmetric, no tenderness/mass/nodules Resp: clear to auscultation bilaterally Cardio: no rub GI: soft, non-tender; bowel sounds normal; no masses,  no organomegaly Extremities: edema 1+ pretibial  Labs: Basic Metabolic Panel:  Recent Labs Lab 03/03/15 1221 03/03/15 1347 03/04/15 0042 03/05/15 0403 03/06/15 0925  NA 135 134* 136 134* 131*  K 4.3 4.0 4.0 3.7 3.9  CL 99 97 97 94* 94*  CO2 26 26 26 28 28   GLUCOSE 279* 295* 344* 225* 290*  BUN 27* 24* 25* 32* 34*  CREATININE 1.15* 1.19* 1.22* 1.81* 1.54*  ALBUMIN 3.2*  --   --   --   --   CALCIUM 9.0 9.2 9.1 8.5 8.7   Liver Function Tests:  Recent Labs Lab 03/03/15 1221  AST 11  ALT 12  ALKPHOS 59  BILITOT 0.9  PROT 6.0  ALBUMIN 3.2*   No results for  input(s): LIPASE, AMYLASE in the last 168 hours. No results for input(s): AMMONIA in the last 168 hours. CBC:  Recent Labs Lab 03/03/15 1221 03/03/15 1347 03/04/15 0042  WBC 7.0 7.9 7.5  NEUTROABS 4.2 4.7  --   HGB 12.9 13.9 14.5  HCT 37.2 39.7 41.7  MCV 88.8 86.7 88.2  PLT 263 231 220   PT/INR: @LABRCNTIP (inr:5) Cardiac Enzymes: ) Recent Labs Lab 03/03/15 1347 03/03/15 1929 03/04/15 0042 03/04/15 1004  TROPONINI 0.06* 0.03 0.05* 0.03   CBG:  Recent Labs Lab 03/05/15  1130 03/05/15 1652 03/05/15 2102 03/06/15 0552 03/06/15 1158  GLUCAP 273* 223* 178* 186* 221*    Iron Studies: No results for input(s): IRON, TIBC, TRANSFERRIN, FERRITIN in the last 168 hours.  Xrays/Other Studies: No results found.   Assessment/Plan: 1.  AKI/CKD- likely due to hypertensive urgency and initiation of ACE-Inhibitor.  Improving off of ACE.  Will continue to follow. 1. [place] without evidence of obstruction but did have some asymmetry in renal size raising possibility of renal artery stenosis.  Will order renal artery duplex 2. Check FeNa and some serologies as well as UPEP/SPEP 2. CHF- improving with diuresis.  ECHO revealed decreased EF and increase in LVH all likely due to poorly controlled HTN for the last 2 years (due to nonadherence with medication or medical follow up). 3. Proteinuria- 24 hour collection is significant for 3.1 grams/24 hours with near normal serum albumin and Hgb A1c of >12%.  Will order serologies however would hold off on renal biopsy given advanced age and nonadherence to medical therapies.  This is all consistent with poorly controlled DM and HTN causing progressive CKD and proteinuria as well as CHF and LVH.   1. Will also order lipid panel to evaluate for nephrotic syndrome. 2. Would not recommend renal biopsy given h/o nonadherence and follow  3. Agree with holding ACE for now in light of AKI/CKD. 4. HTN- much better with medications 5. DM- poorly  controlled 6. dispo- will need SNF for physical therapy but will also need PCP and endocrinology.   Amos Micheals A 03/06/2015, 1:28 PM

## 2015-03-06 NOTE — Progress Notes (Addendum)
Pt briskly ambulated in hallway apprx 553ft with minimal assistance, and front wheel walker.  Pt was on RA and did not require any O2.  Pt agreeable to ambulating again after supper. Please refer to Daily Care Flowsheet documentation.

## 2015-03-06 NOTE — Progress Notes (Signed)
   Subjective: Feels achy  No CP  Breathing OK   Objective: Filed Vitals:   03/05/15 0550 03/05/15 1502 03/05/15 2104 03/06/15 0506  BP: 158/69 124/68 137/61 134/55  Pulse: 72 70 66 69  Temp: 97.8 F (36.6 C)  97.7 F (36.5 C) 97.6 F (36.4 C)  TempSrc: Oral  Oral Oral  Resp: 17 18 17 18   Height:      Weight:      SpO2: 95% 99% 97% 95%   Weight change:   Intake/Output Summary (Last 24 hours) at 03/06/15 0740 Last data filed at 03/06/15 0522  Gross per 24 hour  Intake   1300 ml  Output   1900 ml  Net   -600 ml    General: Alert, awake, oriented x3, in no acute distress Neck:  JVP is normal Heart: Regular rate and rhythm, without murmurs, rubs, gallops.  Lungs: Clear to auscultation.  No rales or wheezes. Exemities:  No edema.   Neuro: Grossly intact, nonfocal.  Net I/O 3.46 L negatie    Lab Results: Results for orders placed or performed during the hospital encounter of 03/03/15 (from the past 24 hour(s))  Glucose, capillary     Status: Abnormal   Collection Time: 03/05/15 11:30 AM  Result Value Ref Range   Glucose-Capillary 273 (H) 70 - 99 mg/dL   Comment 1 Notify RN   Glucose, capillary     Status: Abnormal   Collection Time: 03/05/15  4:52 PM  Result Value Ref Range   Glucose-Capillary 223 (H) 70 - 99 mg/dL   Comment 1 Notify RN   Glucose, capillary     Status: Abnormal   Collection Time: 03/05/15  9:02 PM  Result Value Ref Range   Glucose-Capillary 178 (H) 70 - 99 mg/dL   Comment 1 Notify RN    Comment 2 Document in Chart   Glucose, capillary     Status: Abnormal   Collection Time: 03/06/15  5:52 AM  Result Value Ref Range   Glucose-Capillary 186 (H) 70 - 99 mg/dL    Studies/Results: No results found.  Medications:REivewed     @PROBHOSP @  1  Acute systolic /diastolic CHF Echo yesterday shows LVEF 40% which is different from 2014.  May be relatdd to PPM  NO CP I would recomm continued medical Rx. Patient has diuresed some  Cr has bumped  I  would hold lasix  Follow today Would add low dose coreg.   Patient can have ischemic eval as outpt.    2.  HTN  BP is better  Follow  3  PAF  Patient has been followed by [facility]  SHed did have a very brief episode  WIll review   NOte placed on Xarelto yesterday by Medicine service         LOS: 3 days   Dorris Carnes 03/06/2015, 7:40 AM

## 2015-03-06 NOTE — Clinical Social Work Placement (Addendum)
    Clinical Social Work Department CLINICAL SOCIAL WORK PLACEMENT NOTE 03/06/2015  Patient:  Kelly Neal, Kelly Neal  Account Number:  [id number] Admit date:  03/03/2015  Clinical Social Worker:  Butch Penny CROWDER, LCSW  Date/time:  03/05/2015 01:27 PM  Clinical Social Work is seeking post-discharge placement for this patient at the following level of care:   SKILLED NURSING   (*CSW will update this form in Epic as items are completed)   03/05/2015  Patient/family provided with [facility] Department of Clinical Social Work's list of facilities offering this level of care within the geographic area requested by the patient (or if unable, by the patient's family).  03/05/2015  Patient/family informed of their freedom to choose among providers that offer the needed level of care, that participate in Medicare, Medicaid or managed care program needed by the patient, have an available bed and are willing to accept the patient.  03/05/2015  Patient/family informed of [facility]' ownership interest in [facility], as well as of the fact that they are under no obligation to receive care at this facility.  PASARR submitted to EDS on 03/06/2015 PASARR number received on 03/06/2015  FL2 transmitted to all facilities in geographic area requested by pt/family on  03/06/2015 FL2 transmitted to all facilities within larger geographic area on   Patient informed that his/her managed care company has contracts with or will negotiate with  certain facilities, including the following:   NA     Patient/family informed of bed offers received:  03/06/2015 Patient chooses bed at [facility] Physician recommends and patient chooses bed at    Patient to be transferred [facility]  on  03/07/2015 Patient to be transferred to facility by Ambulance [facility]) Patient and family notified of transfer on 03/07/2015 Name of family member notified:  Vira Browns  The following  physician request were entered in Epic: Physician Request  Please sign FL2.    Additional Comments:

## 2015-03-06 NOTE — Progress Notes (Signed)
Pt briskly ambulated in hallway apprx 544ft with minimal assistance, and front wheel walker. Pt was on RA and did not require any O2. Pt agreeable to ambulating again after supper. Please refer to Daily Care Flowsheet documentation.

## 2015-03-06 NOTE — Discharge Instructions (Signed)
Rivaroxaban oral tablets °What is this medicine? °RIVAROXABAN (ri va ROX a ban) is an anticoagulant (blood thinner). It is used to treat blood clots in the lungs or in the veins. It is also used after knee or hip surgeries to prevent blood clots. It is also used to lower the chance of stroke in people with a medical condition called atrial fibrillation. °This medicine may be used for other purposes; ask your health care provider or pharmacist if you have questions. °COMMON BRAND NAME(S): Xarelto, Xarelto Starter Pack °What should I tell my health care provider before I take this medicine? °They need to know if you have any of these conditions: °-bleeding disorders °-bleeding in the brain °-blood in your stools (black or tarry stools) or if you have blood in your vomit °-history of stomach bleeding °-kidney disease °-liver disease °-low blood counts, like low white cell, platelet, or red cell counts °-recent or planned spinal or epidural procedure °-take medicines that treat or prevent blood clots °-an unusual or allergic reaction to rivaroxaban, other medicines, foods, dyes, or preservatives °-pregnant or trying to get pregnant °-breast-feeding °How should I use this medicine? °Take this medicine by mouth with a glass of water. Follow the directions on the prescription label. Take your medicine at regular intervals. Do not take it more often than directed. Do not stop taking except on your doctor's advice. Stopping this medicine may increase your risk of a blot clot. Be sure to refill your prescription before you run out of medicine. °If you are taking this medicine after hip or knee replacement surgery, take it with or without food. If you are taking this medicine for atrial fibrillation, take it with your evening meal. If you are taking this medicine to treat blood clots, take it with food at the same time each day. If you are unable to swallow your tablet, you may crush the tablet and mix it in applesauce. Then,  immediately eat the applesauce. You should eat more food right after you eat the applesauce containing the crushed tablet. °Talk to your pediatrician regarding the use of this medicine in children. Special care may be needed. °Overdosage: If you think you have taken too much of this medicine contact a poison control center or emergency room at once. °NOTE: This medicine is only for you. Do not share this medicine with others. °What if I miss a dose? °If you take your medicine once a day and miss a dose, take the missed dose as soon as you remember. If you take your medicine twice a day and miss a dose, take the missed dose immediately. In this instance, 2 tablets may be taken at the same time. The next day you should take 1 tablet twice a day as directed. °What may interact with this medicine? °-aspirin and aspirin-like medicines °-certain antibiotics like erythromycin, azithromycin, and clarithromycin °-certain medicines for fungal infections like ketoconazole and itraconazole °-certain medicines for irregular heart beat like amiodarone, quinidine, dronedarone °-certain medicines for seizures like carbamazepine, phenytoin °-certain medicines that treat or prevent blood clots like warfarin, enoxaparin, and dalteparin °-conivaptan °-diltiazem °-felodipine °-indinavir °-lopinavir; ritonavir °-NSAIDS, medicines for pain and inflammation, like ibuprofen or naproxen °-ranolazine °-rifampin °-ritonavir °-St. John's wort °-verapamil °This list may not describe all possible interactions. Give your health care provider a list of all the medicines, herbs, non-prescription drugs, or dietary supplements you use. Also tell them if you smoke, drink alcohol, or use illegal drugs. Some items may interact with your medicine. °What   should I watch for while using this medicine? °Visit your doctor or health care professional for regular checks on your progress. Your condition will be monitored carefully while you are receiving this  medicine. °Notify your doctor or health care professional and seek emergency treatment if you develop breathing problems; changes in vision; chest pain; severe, sudden headache; pain, swelling, warmth in the leg; trouble speaking; sudden numbness or weakness of the face, arm, or leg. These can be signs that your condition has gotten worse. °If you are going to have surgery, tell your doctor or health care professional that you are taking this medicine. °Tell your health care professional that you use this medicine before you have a spinal or epidural procedure. Sometimes people who take this medicine have bleeding problems around the spine when they have a spinal or epidural procedure. This bleeding is very rare. If you have a spinal or epidural procedure while on this medicine, call your health care professional immediately if you have back pain, numbness or tingling (especially in your legs and feet), muscle weakness, paralysis, or loss of bladder or bowel control. °Avoid sports and activities that might cause injury while you are using this medicine. Severe falls or injuries can cause unseen bleeding. Be careful when using sharp tools or knives. Consider using an electric razor. Take special care brushing or flossing your teeth. Report any injuries, bruising, or red spots on the skin to your doctor or health care professional. °What side effects may I notice from receiving this medicine? °Side effects that you should report to your doctor or health care professional as soon as possible: °-allergic reactions like skin rash, itching or hives, swelling of the face, lips, or tongue °-back pain °-redness, blistering, peeling or loosening of the skin, including inside the mouth °-signs and symptoms of bleeding such as bloody or black, tarry stools; red or dark-brown urine; spitting up blood or brown material that looks like coffee grounds; red spots on the skin; unusual bruising or bleeding from the eye, gums, or  nose °Side effects that usually do not require medical attention (Report these to your doctor or health care professional if they continue or are bothersome.): °-dizziness °-muscle pain °This list may not describe all possible side effects. Call your doctor for medical advice about side effects. You may report side effects to FDA at 1-800-FDA-1088. °Where should I keep my medicine? °Keep out of the reach of children. °Store at room temperature between 15 and 30 degrees C (59 and 86 degrees F). Throw away any unused medicine after the expiration date. °NOTE: This sheet is a summary. It may not cover all possible information. If you have questions about this medicine, talk to your doctor, pharmacist, or health care provider. °© 2015, [employer]. (2014-02-23 18:47:48) ° °

## 2015-03-06 NOTE — Progress Notes (Signed)
I cosign all documentation and medication administration by Komicia Jeffries, Student RN. Jaylend Reiland A, RN  

## 2015-03-06 NOTE — Progress Notes (Deleted)
Family will take the pt to [facility] via car.

## 2015-03-07 DIAGNOSIS — I5023 Acute on chronic systolic (congestive) heart failure: Secondary | ICD-10-CM

## 2015-03-07 LAB — RENAL FUNCTION PANEL
Albumin: 2.8 g/dL — ABNORMAL LOW (ref 3.5–5.2)
Anion gap: 12 (ref 5–15)
BUN: 38 mg/dL — ABNORMAL HIGH (ref 6–23)
CALCIUM: 8.7 mg/dL (ref 8.4–10.5)
CHLORIDE: 98 mmol/L (ref 96–112)
CO2: 26 mmol/L (ref 19–32)
Creatinine, Ser: 1.39 mg/dL — ABNORMAL HIGH (ref 0.50–1.10)
GFR, EST AFRICAN AMERICAN: 39 mL/min — AB (ref >90)
GFR, EST NON AFRICAN AMERICAN: 34 mL/min — AB (ref >90)
Glucose, Bld: 169 mg/dL — ABNORMAL HIGH (ref 70–99)
Phosphorus: 4.3 mg/dL (ref 2.3–4.6)
Potassium: 4 mmol/L (ref 3.5–5.1)
Sodium: 136 mmol/L (ref 135–145)

## 2015-03-07 LAB — PROTEIN ELECTROPHORESIS, SERUM
A/G RATIO SPE: 1 (ref 0.7–2.0)
ALBUMIN ELP: 2.7 g/dL — AB (ref 3.2–5.6)
Alpha-1-Globulin: 0.2 g/dL (ref 0.1–0.4)
Alpha-2-Globulin: 0.9 g/dL (ref 0.4–1.2)
Beta Globulin: 0.9 g/dL (ref 0.6–1.3)
GLOBULIN, TOTAL: 2.6 g/dL (ref 2.0–4.5)
Gamma Globulin: 0.7 g/dL (ref 0.5–1.6)
Total Protein ELP: 5.3 g/dL — ABNORMAL LOW (ref 6.0–8.5)

## 2015-03-07 LAB — ANTISTREPTOLYSIN O TITER: ASO: 31.3 IU/mL (ref 0.0–200.0)

## 2015-03-07 LAB — LIPID PANEL
CHOL/HDL RATIO: 4.6 ratio
Cholesterol: 208 mg/dL — ABNORMAL HIGH (ref 0–200)
HDL: 45 mg/dL (ref >39)
LDL Cholesterol: 131 mg/dL — ABNORMAL HIGH (ref 0–99)
Triglycerides: 160 mg/dL — ABNORMAL HIGH (ref <150)
VLDL: 32 mg/dL (ref 0–40)

## 2015-03-07 LAB — GLUCOSE, CAPILLARY
Glucose-Capillary: 179 mg/dL — ABNORMAL HIGH (ref 70–99)
Glucose-Capillary: 316 mg/dL — ABNORMAL HIGH (ref 70–99)

## 2015-03-07 LAB — C4 COMPLEMENT: Complement C4, Body Fluid: 23 mg/dL (ref 14–44)

## 2015-03-07 LAB — COMPLEMENT, TOTAL: Compl, Total (CH50): >60 U/mL — ABNORMAL HIGH (ref 42–60)

## 2015-03-07 LAB — C3 COMPLEMENT: C3 Complement: 117 mg/dL (ref 82–167)

## 2015-03-07 MED ORDER — CARVEDILOL 3.125 MG PO TABS: 3.1250 mg | ORAL_TABLET | Freq: Two times a day (BID) | ORAL | Status: DC

## 2015-03-07 MED ORDER — FUROSEMIDE 40 MG PO TABS: 40.0000 mg | ORAL_TABLET | Freq: Two times a day (BID) | ORAL | Status: DC

## 2015-03-07 MED ORDER — FUROSEMIDE 40 MG PO TABS
40.0000 mg | ORAL_TABLET | Freq: Two times a day (BID) | ORAL | Status: DC
  Filled 2015-03-07 (×2): qty 1

## 2015-03-07 MED ORDER — CARVEDILOL 3.125 MG PO TABS: 1.6250 mg | ORAL_TABLET | Freq: Two times a day (BID) | ORAL | Status: DC

## 2015-03-07 MED ORDER — POTASSIUM CHLORIDE CRYS ER 10 MEQ PO TBCR: 10.0000 meq | EXTENDED_RELEASE_TABLET | Freq: Every day | ORAL | Status: DC

## 2015-03-07 MED ORDER — POTASSIUM CHLORIDE CRYS ER 10 MEQ PO TBCR
10.0000 meq | EXTENDED_RELEASE_TABLET | Freq: Every day | ORAL | Status: DC
  Filled 2015-03-07: qty 1

## 2015-03-07 MED ORDER — CARVEDILOL 3.125 MG PO TABS
3.1250 mg | ORAL_TABLET | Freq: Two times a day (BID) | ORAL | Status: DC
  Filled 2015-03-07 (×2): qty 1

## 2015-03-07 MED ORDER — RIVAROXABAN 15 MG PO TABS: 15.0000 mg | ORAL_TABLET | Freq: Every day | ORAL | Status: DC

## 2015-03-07 NOTE — Progress Notes (Signed)
CSW (Clinical Education officer, museum) made aware of plan for dc today. CSW confirmed with facility they can accept pt today. CSW received call from pt daughter and notified of plan. Pt daughter currently at her own MD appointment and requesting dc wait until she is done. Plan for transport to be arranged between 1:00-1:30pm pending completion of dc summary.  [place], [place]

## 2015-03-07 NOTE — Telephone Encounter (Signed)
Called number back and spoke with [name](the daughter) she stated that she needs all her mothers records sent to [facility]. Told her that she would need to fill out and request for the records to be sent, she stated that she is having a hard time taking care of her mother right now because she is going down in health and needing more care and that she didn't know if she would be able to come by and fill one out. Told her that it was fine and that I would just do a verbal consent and send the records. Will fax today 03/07/15

## 2015-03-07 NOTE — Progress Notes (Signed)
CSW (Clinical Social Worker) prepared pt dc packet and placed with shadow chart. CSW arranged non-emergent ambulance transport. Pt, pt family, pt nurse, and facility informed. CSW signing off.  Yatziry Deakins, LCSWA [phone]  

## 2015-03-07 NOTE — Progress Notes (Signed)
Physical Therapy Treatment Patient Details Name: Kelly Neal MRN: [id number] DOB: 06-Jan-1928 Today's Date: 03/07/2015    History of Present Illness Patient is an 81 yo female admitted 03/03/15 with SOB and LE edema.  Patient with CHF exacerbation and hypertensive urgency.  PMH:  DM, heart block s/p pacemaker, HTN, PAF, frequent falls.    PT Comments    Pt continues with safety awareness issues but shows good improvement with dynamic standing balance throughout session. Expect her to progress well at rehab. Ambulating >300' with RW and min/ min-guard A. PT will continue to follow.   Follow Up Recommendations  SNF;Supervision/Assistance - 24 hour     Equipment Recommendations  None recommended by PT    Recommendations for Other Services       Precautions / Restrictions Precautions Precautions: Fall Precaution Comments: H/o falls at home Restrictions Weight Bearing Restrictions: No    Mobility  Bed Mobility               General bed mobility comments: pt on BSC on arrival  Transfers Overall transfer level: Needs assistance Equipment used: Rolling walker (2 wheeled) Transfers: Sit to/from Stand Sit to Stand: Min guard         General transfer comment: min A  for sit to stand due to posterior lean, took a few seconds for pt to get balance and wt shift fwd  Ambulation/Gait Ambulation/Gait assistance: Min assist;Min guard Ambulation Distance (Feet): 350 Feet Assistive device: Rolling walker (2 wheeled) Gait Pattern/deviations: Step-through pattern;Decreased stride length;Trunk flexed Gait velocity: decreased   General Gait Details: pt tends to maintain fwd flexion to compensate for post LOB, worked on staying in neutral. Pt has a tendency to drift left and then corrects with sudden jerk and lift of RW to right. With cues, pt improved with this and took a few steps to correct to right and then was able to begin to prevent the left drift. With these changes was  able to advance from min to min-guard with second half of ambulation   Stairs            Wheelchair Mobility    Modified Rankin (Stroke Patients Only)       Balance Overall balance assessment: Needs assistance Sitting-balance support: No upper extremity supported;Feet supported Sitting balance-Leahy Scale: Good   Postural control: Posterior lean Standing balance support: No upper extremity supported;During functional activity Standing balance-Leahy Scale: Fair Standing balance comment: worked on standing balance without UE support, eyes open, eyes closed, manual perturbation to front/ back/ side, reaching. Pt improved with practice                    Cognition Arousal/Alertness: Awake/alert Behavior During Therapy: WFL for tasks assessed/performed Overall Cognitive Status: Impaired/Different from baseline Area of Impairment: Safety/judgement     Memory: Decreased short-term memory   Safety/Judgement: Decreased awareness of safety;Decreased awareness of deficits     General Comments: pt somewhat aware of balance issues but decreased safety precaution awareness. Educated her today on good choices to be made with mobility and she voiced understanding    Exercises      General Comments        Pertinent Vitals/Pain Pain Assessment: No/denies pain  VSS    Home Living                      Prior Function            PT Goals (current goals can now  be found in the care plan section) Acute Rehab PT Goals Patient Stated Goal: To get stronger PT Goal Formulation: With patient/family Time For Goal Achievement: 03/11/15 Potential to Achieve Goals: Good Progress towards PT goals: Progressing toward goals    Frequency  Min 3X/week    PT Plan Current plan remains appropriate    Co-evaluation             End of Session Equipment Utilized During Treatment: Gait belt Activity Tolerance: Patient tolerated treatment well Patient left: in  chair;with call bell/phone within reach     Time: 1151-1224 PT Time Calculation (min) (ACUTE ONLY): 33 min  Charges:  $Gait Training: 8-22 mins $Therapeutic Activity: 8-22 mins                    G Codes:     Leighton Roach, PT  Acute Rehab Services  [phone]  Leighton Roach 03/07/2015, 12:31 PM

## 2015-03-07 NOTE — Progress Notes (Signed)
I cosign all documentation and medication administration by Komicia Jeffries, student RN.  

## 2015-03-07 NOTE — Clinical Documentation Improvement (Signed)
Presents with Acute on Chronic Diastolic, Acute on CKD.   Recent ECHO reveals EF 40%, moderate LVH  Hypertension is an assumed link with CKD  Hypertension is not an assumed link with heart disease  Please clarify if patient has HTN Heart and CKD     HTN CKD only     Other Condition     Cannot Clinically Determine  Please document findings in next progress note and include in discharge summary if applicable.   Thank You, Zoila Shutter ,RN Clinical Documentation Specialist:  [place] Information Management

## 2015-03-07 NOTE — Progress Notes (Signed)
Family Medicine Teaching Service Daily Progress Note Intern Pager: [phone]  Patient name: Kelly Neal Medical record number: [id number] Date of birth: [birth year] Age: 81 y.o. Gender: female  Primary Care Provider: Ellsworth Lennox, MD Consultants: Cardiology (HF) Code Status: FULL  Pt Overview and Major Events to Date:  4/16 - admitted, leg swelling with BNP 544, presumed acute-on-chronic CHF, hypertensive urgency with proteinuria 4/17 - BP improved, normal renal US; troponin with very slight increase (?demand); cardiology consulted 4/19: 24 hour  Protein elevated nephro consulted  Assessment and Plan: Kelly Neal is a 81 y.o. female presenting with LE edema, hypertensive urgency and proteinuria. PMH is significant for T2DM, heart block (s/p pacer placement in 2014), HTN, HLD, and frequent falls.   Acute on chronic systolic/diastolic CHF: On admission, Pro-BNP elevated to 544 with subacute LE edema and orthopnea. Troponins neg, LBB EKG - hold lasix per Cr bump, LE edema improved - Daily weights, strict I/O - 136-> 132-> 131, stable at 131 today - hold ACE inhibitor   Acute systolic/diastolic CHF- EF 42% -cards following appreciate recs - continue coreg 1.5  Acute on Chronic Kidney disease: Likley in setting of poorly controlled blood pressures for approx 2 years, poorly controlled DM - Cr trending down - Renal US 4/17 normal - 24 hr protein 3192 md/dL,  - hold  Lasix and Ace, consider restarting as renal function improves - FeNa .2, indicating prerenal cause of kidney injury, but creatinine improving - following further pedning work uo - renal consulted, appreciate recs  Hypertensive urgency: Improved - continue labetalol 10mg  IV q2h PRN - consider adding hydralazine 25 mg if blood pressures remain elevated and has increased requirement og PRN labetalol    H/o Second degree heart bock ( [place] 1): s/p pacemaker June 2014, now pacemaker-dependent.  - continue  telemetry monitoring  T2DM: Poorly controlled - A1c 12.4  - continue SSI with CBG's qAC+HS, for now  Weakness:  - F/u PT/OT evaluation / recommendations  PAF- stable CHADS Vasc 2score 5 not anticoagulated at home - will start xarelto 15 mg qD per pharmacy   FEN/GI: Heart healthy/carb modified diet; saline lock IV Prophylaxis: Subcutaneous heparin  Disposition: Likley today to rehab facility contingent upon clinical stability -   Subjective:   Feeling improved, denies SOB, chest pain, headache  Objective: Temp:  [97.7 F (36.5 C)-98.5 F (36.9 C)] 98.5 F (36.9 C) (04/20 0542) Pulse Rate:  [64-71] 70 (04/20 0542) Resp:  [18-20] 18 (04/20 0542) BP: (127-162)/(58-80) 127/58 mmHg (04/20 0542) SpO2:  [96 %-99 %] 99 % (04/20 0542) Weight:  [131 lb 1.6 oz (59.467 kg)-131 lb 9.6 oz (59.693 kg)] 131 lb 1.6 oz (59.467 kg) (04/20 0357) Physical Exam: General: Very pleasant elderly woman in no distress, sitting in chair eating breakfast comfortably HEENT: MMM Cardiovascular: RRR without frank murmur Respiratory: Nonlabored breathing, CTAB, on room air Abdomen: soft, nontender, BS+ Extremities: 1+ pitting edema worse on the left Skin: chronic venous stasis skin changes bilaterally, otherwise no wounds or rashes Neuro: Alert and oriented, speech normal    Laboratory:  Recent Labs Lab 03/03/15 1221 03/03/15 1347 03/04/15 0042  WBC 7.0 7.9 7.5  HGB 12.9 13.9 14.5  HCT 37.2 39.7 41.7  PLT 263 231 220    Recent Labs Lab 03/03/15 1221  03/05/15 0403 03/06/15 0925 03/07/15 0336  NA 135  < > 134* 131* 136  K 4.3  < > 3.7 3.9 4.0  CL 99  < > 94* 94* 98  CO2 26  < >  28 28 26   BUN 27*  < > 32* 34* 38*  CREATININE 1.15*  < > 1.81* 1.54* 1.39*  CALCIUM 9.0  < > 8.5 8.7 8.7  PROT 6.0  --   --   --   --   BILITOT 0.9  --   --   --   --   ALKPHOS 59  --   --   --   --   ALT 12  --   --   --   --   AST 11  --   --   --   --   GLUCOSE 279*  < > 225* 290* 169*  < > =  values in this interval not displayed.   Imaging/Diagnostic Tests: ED EKG: LBBB (not new), no obvious pacer spikes CXR 4/16 @1254 : pacer in place; no acute cardiopulmonary disease Renal US 4/17: normal study  Veatrice Bourbon, MD 03/07/2015, 8:05 AM PGY-1, [place] Intern pager: [phone], text pages welcome

## 2015-03-07 NOTE — Progress Notes (Addendum)
Subjective: Patinet denies SOB or CP  In chair   Objective: Filed Vitals:   03/06/15 2136 03/06/15 2200 03/07/15 0357 03/07/15 0542  BP: 162/65 130/78  127/58  Pulse: 71   70  Temp: 98.2 F (36.8 C)   98.5 F (36.9 C)  TempSrc: Oral   Oral  Resp: 20   18  Height:      Weight:   131 lb 1.6 oz (59.467 kg)   SpO2: 99%   99%   Weight change:   Intake/Output Summary (Last 24 hours) at 03/07/15 0732 Last data filed at 03/07/15 4332  Gross per 24 hour  Intake   1300 ml  Output   2075 ml  Net   -775 ml    General: Alert, awake, oriented x3, in no acute distress Neck:  JVP is normal Heart: Regular rate and rhythm, without murmurs, rubs, gallops.  Lungs: Clear to auscultation.  No rales or wheezes. Exemities:  No edema.   Neuro: Grossly intact, nonfocal.  TEle:  SR  PAced   Lab Results: Results for orders placed or performed during the hospital encounter of 03/03/15 (from the past 24 hour(s))  Basic metabolic panel     Status: Abnormal   Collection Time: 03/06/15  9:25 AM  Result Value Ref Range   Sodium 131 (L) 135 - 145 mmol/L   Potassium 3.9 3.5 - 5.1 mmol/L   Chloride 94 (L) 96 - 112 mmol/L   CO2 28 19 - 32 mmol/L   Glucose, Bld 290 (H) 70 - 99 mg/dL   BUN 34 (H) 6 - 23 mg/dL   Creatinine, Ser 1.54 (H) 0.50 - 1.10 mg/dL   Calcium 8.7 8.4 - 10.5 mg/dL   GFR calc non Af Amer 30 (L) >90 mL/min   GFR calc Af Amer 35 (L) >90 mL/min   Anion gap 9 5 - 15  Glucose, capillary     Status: Abnormal   Collection Time: 03/06/15 11:58 AM  Result Value Ref Range   Glucose-Capillary 221 (H) 70 - 99 mg/dL  Urinalysis, Routine w reflex microscopic     Status: Abnormal   Collection Time: 03/06/15  2:29 PM  Result Value Ref Range   Color, Urine YELLOW YELLOW   APPearance CLOUDY (A) CLEAR   Specific Gravity, Urine 1.015 1.005 - 1.030   pH 5.0 5.0 - 8.0   Glucose, UA 500 (A) NEGATIVE mg/dL   Hgb urine dipstick NEGATIVE NEGATIVE   Bilirubin Urine NEGATIVE NEGATIVE   Ketones, ur  NEGATIVE NEGATIVE mg/dL   Protein, ur 100 (A) NEGATIVE mg/dL   Urobilinogen, UA 0.2 0.0 - 1.0 mg/dL   Nitrite NEGATIVE NEGATIVE   Leukocytes, UA SMALL (A) NEGATIVE  ANA *Canceled*     Status: None ()   Collection Time: 03/06/15  2:29 PM   Narrative   LIS Cancel (ORR/DE = Data Error)  Urine microscopic-add on     Status: Abnormal   Collection Time: 03/06/15  2:29 PM  Result Value Ref Range   Squamous Epithelial / LPF MANY (A) RARE   WBC, UA 7-10 <3 WBC/hpf   RBC / HPF 0-2 <3 RBC/hpf   Bacteria, UA RARE RARE  Glucose, capillary     Status: Abnormal   Collection Time: 03/06/15  4:56 PM  Result Value Ref Range   Glucose-Capillary 296 (H) 70 - 99 mg/dL  Sodium, urine, random     Status: None   Collection Time: 03/06/15  5:03 PM  Result Value Ref Range  Sodium, Ur 22 mmol/L  Creatinine, urine, random     Status: None   Collection Time: 03/06/15  5:03 PM  Result Value Ref Range   Creatinine, Urine 102.40 mg/dL  Glucose, capillary     Status: Abnormal   Collection Time: 03/06/15  9:34 PM  Result Value Ref Range   Glucose-Capillary 177 (H) 70 - 99 mg/dL  Renal function panel     Status: Abnormal   Collection Time: 03/07/15  3:36 AM  Result Value Ref Range   Sodium 136 135 - 145 mmol/L   Potassium 4.0 3.5 - 5.1 mmol/L   Chloride 98 96 - 112 mmol/L   CO2 26 19 - 32 mmol/L   Glucose, Bld 169 (H) 70 - 99 mg/dL   BUN 38 (H) 6 - 23 mg/dL   Creatinine, Ser 1.39 (H) 0.50 - 1.10 mg/dL   Calcium 8.7 8.4 - 10.5 mg/dL   Phosphorus 4.3 2.3 - 4.6 mg/dL   Albumin 2.8 (L) 3.5 - 5.2 g/dL   GFR calc non Af Amer 34 (L) >90 mL/min   GFR calc Af Amer 39 (L) >90 mL/min   Anion gap 12 5 - 15  Lipid panel     Status: Abnormal   Collection Time: 03/07/15  3:36 AM  Result Value Ref Range   Cholesterol 208 (H) 0 - 200 mg/dL   Triglycerides 160 (H) <150 mg/dL   HDL 45 >39 mg/dL   Total CHOL/HDL Ratio 4.6 RATIO   VLDL 32 0 - 40 mg/dL   LDL Cholesterol 131 (H) 0 - 99 mg/dL  Glucose, capillary      Status: Abnormal   Collection Time: 03/07/15  6:27 AM  Result Value Ref Range   Glucose-Capillary 179 (H) 70 - 99 mg/dL    Studies/Results: No results found.  Medications:Reviewed    @PROBHOSP @ 1.  Acute on chronic systolic / diastolic CHF Volume status looks pretty good   Willi increase coreg to 3.125 bid   Add lasix po to regimen 40   With 10 KCl LVEF is down some from prevvious but would not pursue work up now.  Comfortable    2.  HTN   Changes as noted   3.  PAF  Pacer interrogation shows more intermitt Afib  Agree wit hXarelto  Will follow as outpt.  I would d/c asa given xarelto use   Will make sure she has f/u appt  OK to dc from cardiac standpoint    LOS: 4 days   Dorris Carnes 03/07/2015, 7:32 AM

## 2015-03-07 NOTE — Progress Notes (Signed)
Report called to [facility] .Patient to be transported via [facility]. Patient belongings taken by patients daughter to SNF.

## 2015-03-07 NOTE — Clinical Documentation Improvement (Signed)
Presents with Acute on Chronic Diastolic CHF with ARF and CKD.    White female  GFR's for this admission ranging from 25 to 30  Please clarify the likely stage of CKD your patient has from the list below and document findings in next progress note and include in discharge summary if applicable.  _______CKD Stage I - GFR > OR = 90 _______CKD Stage II - GFR 60-80 _______CKD Stage III - GFR 30-59 _______CKD Stage IV - GFR 15-29 _______CKD Stage V - GFR < 15 _______ESRD (End Stage Renal Disease) _______Other condition_____________ _______Cannot Clinically determine   Thank You, Zoila Shutter ,RN Clinical Documentation Specialist:  [phone]  [place] Information Management

## 2015-03-07 NOTE — Progress Notes (Signed)
Inpatient Diabetes Program Recommendations  AACE/ADA: New Consensus Statement on Inpatient Glycemic Control (2013)  Target Ranges:  Prepandial:   less than 140 mg/dL      Peak postprandial:   less than 180 mg/dL (1-2 hours)      Critically ill patients:  140 - 180 mg/dL   Inpatient Diabetes Program Recommendations Insulin - Basal: Increase Lantus to 8 units  Insulin - Meal Coverage: consider adding prandial dose Novolog 3 units TID with meals per Glycemic Control order set  Thank you  Raoul Pitch BSN, RN,CDE Inpatient Diabetes Coordinator [phone] (team pager)

## 2015-03-07 NOTE — Progress Notes (Signed)
Patient ID: Kelly Neal, female   DOB: Mar 29, 1928, 81 y.o.   MRN: [id number] S:feels better O:BP 127/58 mmHg  Pulse 70  Temp(Src) 98.5 F (36.9 C) (Oral)  Resp 18  Ht 5\' 8"  (1.727 m)  Wt 59.467 kg (131 lb 1.6 oz)  BMI 19.94 kg/m2  SpO2 99%  Intake/Output Summary (Last 24 hours) at 03/07/15 0957 Last data filed at 03/07/15 0900  Gross per 24 hour  Intake   1180 ml  Output   1775 ml  Net   -595 ml   Intake/Output: I/O last 3 completed shifts: In: 1540 [P.O.:1540] Out: 3275 [Urine:3275]  Intake/Output this shift:  Total I/O In: 120 [P.O.:120] Out: 200 [Urine:200] Weight change:  XTK:WIOXB elderly WF in NAD CVS: no rub Resp:cta DZH:GDJMEQ Ext:1+ edema   Recent Labs Lab 03/03/15 1221 03/03/15 1347 03/04/15 0042 03/05/15 0403 03/06/15 0925 03/07/15 0336  NA 135 134* 136 134* 131* 136  K 4.3 4.0 4.0 3.7 3.9 4.0  CL 99 97 97 94* 94* 98  CO2 26 26 26 28 28 26   GLUCOSE 279* 295* 344* 225* 290* 169*  BUN 27* 24* 25* 32* 34* 38*  CREATININE 1.15* 1.19* 1.22* 1.81* 1.54* 1.39*  ALBUMIN 3.2*  --   --   --   --  2.8*  CALCIUM 9.0 9.2 9.1 8.5 8.7 8.7  PHOS  --   --   --   --   --  4.3  AST 11  --   --   --   --   --   ALT 12  --   --   --   --   --    Liver Function Tests:  Recent Labs Lab 03/03/15 1221 03/07/15 0336  AST 11  --   ALT 12  --   ALKPHOS 59  --   BILITOT 0.9  --   PROT 6.0  --   ALBUMIN 3.2* 2.8*   No results for input(s): LIPASE, AMYLASE in the last 168 hours. No results for input(s): AMMONIA in the last 168 hours. CBC:  Recent Labs Lab 03/03/15 1221 03/03/15 1347 03/04/15 0042  WBC 7.0 7.9 7.5  NEUTROABS 4.2 4.7  --   HGB 12.9 13.9 14.5  HCT 37.2 39.7 41.7  MCV 88.8 86.7 88.2  PLT 263 231 220   Cardiac Enzymes:  Recent Labs Lab 03/03/15 1347 03/03/15 1929 03/04/15 0042 03/04/15 1004  TROPONINI 0.06* 0.03 0.05* 0.03   CBG:  Recent Labs Lab 03/06/15 0552 03/06/15 1158 03/06/15 1656 03/06/15 2134 03/07/15 0627   GLUCAP 186* 221* 296* 177* 179*    Iron Studies: No results for input(s): IRON, TIBC, TRANSFERRIN, FERRITIN in the last 72 hours. Studies/Results: No results found. Marland Kitchen aspirin  81 mg Oral Daily  . carvedilol  1.5625 mg Oral BID WC  . insulin aspart  0-15 Units Subcutaneous TID WC  . insulin aspart  0-5 Units Subcutaneous QHS  . insulin glargine  5 Units Subcutaneous QHS  . polyethylene glycol  17 g Oral Daily  . Rivaroxaban  15 mg Oral Daily  . senna-docusate  1 tablet Oral QHS  . sodium chloride  3 mL Intravenous Q12H    BMET    Component Value Date/Time   NA 136 03/07/2015 0336   K 4.0 03/07/2015 0336   CL 98 03/07/2015 0336   CO2 26 03/07/2015 0336   GLUCOSE 169* 03/07/2015 0336   BUN 38* 03/07/2015 0336   CREATININE 1.39* 03/07/2015 6834  CREATININE 1.15* 03/03/2015 1221   CALCIUM 8.7 03/07/2015 0336   GFRNONAA 34* 03/07/2015 0336   GFRAA 39* 03/07/2015 0336   CBC    Component Value Date/Time   WBC 7.5 03/04/2015 0042   WBC 8.4 07/22/2013 1741   RBC 4.73 03/04/2015 0042   RBC 3.95* 07/22/2013 1741   HGB 14.5 03/04/2015 0042   HGB 11.8* 07/22/2013 1741   HCT 41.7 03/04/2015 0042   HCT 37.3* 07/22/2013 1741   PLT 220 03/04/2015 0042   MCV 88.2 03/04/2015 0042   MCV 94.5 07/22/2013 1741   MCH 30.7 03/04/2015 0042   MCH 29.9 07/22/2013 1741   MCHC 34.8 03/04/2015 0042   MCHC 31.6* 07/22/2013 1741   RDW 12.3 03/04/2015 0042   LYMPHSABS 2.2 03/03/2015 1347   MONOABS 0.8 03/03/2015 1347   EOSABS 0.2 03/03/2015 1347   BASOSABS 0.0 03/03/2015 1347     Assessment/Plan: 1. AKI/CKD- likely due to hypertensive urgency and initiation of ACE-Inhibitor. Improving off of ACE. Will continue to follow. 1. [place] without evidence of obstruction but did have some asymmetry in renal size raising possibility of renal artery stenosis. Will order renal artery duplex 2. FeNa was low 3. UPEP/SPEP pending 4. Improving off of lisinopril 2. CHF- improving with diuresis. ECHO  revealed decreased EF and increase in LVH all likely due to poorly controlled HTN for the last 2 years (due to nonadherence with medication or medical follow up). 1. Ok to resume lasix 20mg  daily and follow up as an outpt with Cardiology. 3. Proteinuria- 24 hour collection is significant for 3.1 grams/24 hours with near normal serum albumin and Hgb A1c of >12%. most consistent with diabetic nephropathy and poorly controlled HTN.   Serologies pending.   1. nephrotic syndrome with high total chol and ldl, low albumin, consistent with diabetic nephropathy. 2. Would not recommend renal biopsy given h/o nonadherence to medications and follow up.    3. Agree with holding ACE for now in light of AKI/CKD. 4. Will follow up as an outpt and await SPEP/UPEP 4. HTN- much better with medications 5. DM- poorly controlled 6. dispo- will need SNF for physical therapy but will also need PCP and endocrinology.  Will arrange for follow up with our office 04/06/15 at 8:15am, [facility], [address] [place], [place] [address]. [phone] and per her report she has chosen [facility].  Will sign off and follow as an outpt.  [place] A

## 2015-03-08 ENCOUNTER — Telehealth: Payer: Self-pay | Admitting: Cardiology

## 2015-03-08 ENCOUNTER — Encounter: Payer: Medicare Other | Admitting: *Deleted

## 2015-03-08 LAB — UIFE/LIGHT CHAINS/TP QN, 24-HR UR
ALPHA 1 UR: DETECTED — AB
Albumin, U: DETECTED
Alpha 2, Urine: DETECTED — AB
BETA UR: DETECTED — AB
GAMMA UR: DETECTED — AB
Total Protein, Urine: 206 mg/dL — ABNORMAL HIGH (ref 5–24)

## 2015-03-08 NOTE — Telephone Encounter (Signed)
LMOVM reminding pt to send remote transmission.   

## 2015-03-09 ENCOUNTER — Encounter: Payer: Self-pay | Admitting: Internal Medicine

## 2015-03-09 ENCOUNTER — Encounter: Payer: Self-pay | Admitting: Cardiology

## 2015-03-09 ENCOUNTER — Non-Acute Institutional Stay (SKILLED_NURSING_FACILITY): Payer: Medicare Other | Admitting: Internal Medicine

## 2015-03-09 ENCOUNTER — Ambulatory Visit: Payer: Medicare Other | Admitting: Family Medicine

## 2015-03-09 DIAGNOSIS — I480 Paroxysmal atrial fibrillation: Secondary | ICD-10-CM | POA: Diagnosis not present

## 2015-03-09 DIAGNOSIS — R609 Edema, unspecified: Secondary | ICD-10-CM | POA: Diagnosis not present

## 2015-03-09 DIAGNOSIS — N184 Chronic kidney disease, stage 4 (severe): Secondary | ICD-10-CM | POA: Diagnosis not present

## 2015-03-09 DIAGNOSIS — Z950 Presence of cardiac pacemaker: Secondary | ICD-10-CM | POA: Diagnosis not present

## 2015-03-09 DIAGNOSIS — I5042 Chronic combined systolic (congestive) and diastolic (congestive) heart failure: Secondary | ICD-10-CM | POA: Diagnosis not present

## 2015-03-09 NOTE — Assessment & Plan Note (Addendum)
On admission she was noted to have 3+ pitting LE edema which had slowly increased in the weeks to months leading up to admission. Her abs were significant for an elevated Creatinine to 1.8 from her baseline 1.2. She was seen by cardiology for concern of worsening heart function. Her EKG and troponins were normal. Her admission BNP was 544. Se ha da echocardiogram which showed decreased ejection fraction to 40%. She was diurese with lasix with a weight loss from 136-131 and marked improvement of her lower extremity edema. Coreg 1.5 was initiated and tolerated well   HOWEVER PT D/C MEDS INCLUDED ZESTORETIC, but no lasix or BBlocker. Cardiology consult rec coreg 3.125 BID and Lasix 40 mg daily, and d/c pt ASA on Xarelto but pt not sent out on xarelto. Nephrology rec lasix 20 mg daily and no ACE so I will d/c zestoretic, start lasix 20 mg with 10 K+, start coreg 3.125 and restart xarelro and d/c ASA

## 2015-03-09 NOTE — Assessment & Plan Note (Signed)
Pt was not d/c on lasix or bblocker so I have started these in SNF, alonfg with K+ supplement

## 2015-03-09 NOTE — Progress Notes (Signed)
MRN: [id number] Name: Kelly Neal  Sex: female Age: 81 y.o. DOB: Sep 21, 1928  [place] #: [facility] Facility/Room:114 Level Of Care: SNF Provider: Inocencio Homes D Emergency Contacts: Extended Emergency Contact Information Primary Emergency Contact: Jacklynn Barnacle of [place] Phone: [phone] Mobile Phone: [phone] Relation: Daughter  Code Status: DNR  Allergies: Review of patient's allergies indicates no known allergies.  Chief Complaint  Patient presents with  . New Admit To SNF    HPI: Patient is 81 y.o. female who was admitted to hospital with pedal edema, complicated by renal failure and chronic CHF who is now admitted to SNF for OT/PT.  Past Medical History  Diagnosis Date  . Hyperlipidemia   . Hypertension   . DM type 2 (diabetes mellitus, type 2)   . Paroxysmal atrial fibrillation 09/05/2014    Past Surgical History  Procedure Laterality Date  . Cataract extraction    . Permanent pacemaker insertion N/A 04/20/2013    Procedure: PERMANENT PACEMAKER INSERTION;  Surgeon: Evans Lance, MD;  Location: [facility] CATH LAB;  Service: Cardiovascular;  Laterality: N/A;      Medication List       This list is accurate as of: 03/09/15 11:59 PM.  Always use your most recent med list.               aspirin EC 81 MG tablet  Take 1 tablet (81 mg total) by mouth daily.     glimepiride 2 MG tablet  Commonly known as:  AMARYL  Take 1 tablet (2 mg total) by mouth daily before breakfast.     lisinopril-hydrochlorothiazide 10-12.5 MG per tablet  Commonly known as:  PRINZIDE,ZESTORETIC  Take 0.5 tablets by mouth 2 (two) times daily.     metFORMIN 500 MG 24 hr tablet  Commonly known as:  GLUCOPHAGE-XR  Take 1/2 tablet in the morning, 1 tablet in the evening        No orders of the defined types were placed in this encounter.     There is no immunization history on file for this patient.  History  Substance Use Topics  . Smoking status:  Former Smoker -- 2 years    Types: Cigarettes    Quit date: 11/17/1950  . Smokeless tobacco: Not on file  . Alcohol Use: No    Family history is noncontributory    Review of Systems  DATA OBTAINED: from patient, nurse, medical record GENERAL:  no fevers, fatigue, appetite changes SKIN: No itching, rash or wounds EYES: No eye pain, redness, discharge EARS: No earache, tinnitus, change in hearing NOSE: No congestion, drainage or bleeding  MOUTH/THROAT: No mouth or tooth pain, No sore throat RESPIRATORY: No cough, wheezing, SOB CARDIAC: No chest pain, palpitations, lower extremity edema  GI: No abdominal pain, No N/V/D or constipation, No heartburn or reflux  GU: No dysuria, frequency or urgency, or incontinence  MUSCULOSKELETAL: No unrelieved bone/joint pain NEUROLOGIC: No headache, dizziness or focal weakness PSYCHIATRIC: No overt anxiety or sadness, No behavior issue.   Filed Vitals:   03/09/15 1117  BP: 123/68  Pulse: 71  Temp: 97.4 F (36.3 C)  Resp: 18    Physical Exam  GENERAL APPEARANCE: Alert,  No acute distress.  SKIN: No diaphoresis rash HEAD: Normocephalic, atraumatic  EYES: Conjunctiva/lids clear. Pupils round, reactive. EOMs intact.  EARS: External exam WNL, canals clear. Hearing grossly normal.  NOSE: No deformity or discharge.  MOUTH/THROAT: Lips w/o lesions  RESPIRATORY: Breathing is even, unlabored. Lung sounds are  clear   CARDIOVASCULAR: Heart RRR no murmurs, rubs or gallops. trace peripheral edema.   GASTROINTESTINAL: Abdomen is soft, non-tender, not distended w/ normal bowel sounds. GENITOURINARY: Bladder non tender, not distended  MUSCULOSKELETAL: No abnormal joints or musculature NEUROLOGIC:  Cranial nerves 2-12 grossly intact PSYCHIATRIC: Mood and affect appropriate to situation, no behavioral issues  Patient Active Problem List   Diagnosis Date Noted  . CKD (chronic kidney disease) stage 4, GFR 15-29 ml/min 03/09/2015  . Elevated troponin    . Peripheral edema   . Proteinuria   . Chronic combined systolic and diastolic CHF (congestive heart failure) 03/03/2015  . Paroxysmal atrial fibrillation 09/05/2014  . Dizzy spells 05/26/2013  . Cardiac pacemaker in situ-St Jude implanted 04/20/13 04/21/2013  . Second degree atrioventricular block, Mobitz (type) I 04/19/2013  . Syncope 04/19/2013  . Symptomatic bradycardia 04/19/2013  . Hyperlipidemia   . Hypertension   . DM type 2 (diabetes mellitus, type 2)     CBC    Component Value Date/Time   WBC 7.5 03/04/2015 0042   WBC 8.4 07/22/2013 1741   RBC 4.73 03/04/2015 0042   RBC 3.95* 07/22/2013 1741   HGB 14.5 03/04/2015 0042   HGB 11.8* 07/22/2013 1741   HCT 41.7 03/04/2015 0042   HCT 37.3* 07/22/2013 1741   PLT 220 03/04/2015 0042   MCV 88.2 03/04/2015 0042   MCV 94.5 07/22/2013 1741   LYMPHSABS 2.2 03/03/2015 1347   MONOABS 0.8 03/03/2015 1347   EOSABS 0.2 03/03/2015 1347   BASOSABS 0.0 03/03/2015 1347    CMP     Component Value Date/Time   NA 136 03/07/2015 0336   K 4.0 03/07/2015 0336   CL 98 03/07/2015 0336   CO2 26 03/07/2015 0336   GLUCOSE 169* 03/07/2015 0336   BUN 38* 03/07/2015 0336   CREATININE 1.39* 03/07/2015 0336   CREATININE 1.15* 03/03/2015 1221   CALCIUM 8.7 03/07/2015 0336   PROT 6.0 03/03/2015 1221   ALBUMIN 2.8* 03/07/2015 0336   AST 11 03/03/2015 1221   ALT 12 03/03/2015 1221   ALKPHOS 59 03/03/2015 1221   BILITOT 0.9 03/03/2015 1221   GFRNONAA 34* 03/07/2015 0336   GFRAA 39* 03/07/2015 0336    Assessment and Plan  Peripheral edema On admission she was noted to have 3+ pitting LE edema which had slowly increased in the weeks to months leading up to admission. Her abs were significant for an elevated Creatinine to 1.8 from her baseline 1.2. She was seen by cardiology for concern of worsening heart function. Her EKG and troponins were normal. Her admission BNP was 544. Se ha da echocardiogram which showed decreased ejection fraction to  40%. She was diurese with lasix with a weight loss from 136-131 and marked improvement of her lower extremity edema. Coreg 1.5 was initiated and tolerated well   HOWEVER PT D/C MEDS INCLUDED ZESTORETIC, but no lasix or BBlocker. Cardiology consult rec coreg 3.125 BID and Lasix 40 mg daily, and d/c pt ASA on Xarelto but pt not sent out on xarelto. Nephrology rec lasix 20 mg daily and no ACE so I will d/c zestoretic, start lasix 20 mg with 10 K+, start coreg 3.125 and restart xarelro and d/c ASA   CKD (chronic kidney disease) stage 4, GFR 15-29 ml/min were significant elevated Creatinine a 24 hour protein was collected and was 3192 mg/day. Lasix was then held as well as her home lisinopril for concern of nephrotoxicity. Nephology was consulted and evaluated her. Testing was significant for  a FeNa consistent with prerenal cause of kidney injury, negative ASO, negative complement testing. Creatinine continued to improve with inpatient. And as it imporved she was restarted on Lasix 40 BID with potassium replacement  HOWEVER, pt sent out on ACE and no lasix despite nephrology and cardiology consult recs. I am d/c zestoretic, restarting lasix 20 mg daily as per nephrology and coreg 3.125 mg BID as per cardiology.   Paroxysmal atrial fibrillation Per cards consult pt has intermiddent runs of A fib;pt was on xarelto in hospital but not on d/c summary meds, but a lot of meds rec by consults were not; I have discussed xarelto vs ASA with pt and daughters and they will get back to me   Chronic combined systolic and diastolic CHF (congestive heart failure) Pt was not d/c on lasix or bblocker so I have started these in SNF, alonfg with K+ supplement   DM type 2 (diabetes mellitus, type 2) A1c in hosp 12.4, pt not compliant with meds; Glucophage was changed to 250 BID and amaryl the same and BS running good here. Will prov=bably need to d/c glucophage 2/2 renal failure.   Cardiac pacemaker in situ-St Jude  implanted 04/20/13 Noted     Hennie Duos, MD

## 2015-03-09 NOTE — Assessment & Plan Note (Addendum)
Per cards consult pt has intermiddent runs of A fib;pt was on xarelto in hospital but not on d/c summary meds, but a lot of meds rec by consults were not; I have discussed xarelto vs ASA with pt and daughters and they will get back to me

## 2015-03-09 NOTE — Assessment & Plan Note (Addendum)
were significant elevated Creatinine a 24 hour protein was collected and was 3192 mg/day. Lasix was then held as well as her home lisinopril for concern of nephrotoxicity. Nephology was consulted and evaluated her. Testing was significant for a FeNa consistent with prerenal cause of kidney injury, negative ASO, negative complement testing. Creatinine continued to improve with inpatient. And as it imporved she was restarted on Lasix 40 BID with potassium replacement  HOWEVER, pt sent out on ACE and no lasix despite nephrology and cardiology consult recs. I am d/c zestoretic, restarting lasix 20 mg daily as per nephrology and coreg 3.125 mg BID as per cardiology.

## 2015-03-09 NOTE — Assessment & Plan Note (Signed)
A1c in hosp 12.4, pt not compliant with meds; Glucophage was changed to 250 BID and amaryl the same and BS running good here. Will prov=bably need to d/c glucophage 2/2 renal failure.

## 2015-03-12 ENCOUNTER — Ambulatory Visit (INDEPENDENT_AMBULATORY_CARE_PROVIDER_SITE_OTHER): Payer: Medicare Other | Admitting: *Deleted

## 2015-03-12 ENCOUNTER — Encounter: Payer: Self-pay | Admitting: Internal Medicine

## 2015-03-12 ENCOUNTER — Encounter: Payer: Self-pay | Admitting: Nurse Practitioner

## 2015-03-12 ENCOUNTER — Ambulatory Visit (INDEPENDENT_AMBULATORY_CARE_PROVIDER_SITE_OTHER): Payer: Medicare Other | Admitting: Nurse Practitioner

## 2015-03-12 VITALS — BP 146/64 | HR 68 | Ht 68.0 in | Wt 134.0 lb

## 2015-03-12 DIAGNOSIS — I10 Essential (primary) hypertension: Secondary | ICD-10-CM | POA: Insufficient documentation

## 2015-03-12 DIAGNOSIS — I5042 Chronic combined systolic (congestive) and diastolic (congestive) heart failure: Secondary | ICD-10-CM

## 2015-03-12 DIAGNOSIS — I441 Atrioventricular block, second degree: Secondary | ICD-10-CM

## 2015-03-12 DIAGNOSIS — I480 Paroxysmal atrial fibrillation: Secondary | ICD-10-CM

## 2015-03-12 DIAGNOSIS — I495 Sick sinus syndrome: Secondary | ICD-10-CM

## 2015-03-12 DIAGNOSIS — R001 Bradycardia, unspecified: Secondary | ICD-10-CM | POA: Diagnosis not present

## 2015-03-12 DIAGNOSIS — R42 Dizziness and giddiness: Secondary | ICD-10-CM | POA: Diagnosis not present

## 2015-03-12 DIAGNOSIS — N183 Chronic kidney disease, stage 3 unspecified: Secondary | ICD-10-CM

## 2015-03-12 LAB — MDC_IDC_ENUM_SESS_TYPE_INCLINIC
Battery Remaining Longevity: 146.4 mo
Battery Voltage: 3.02 V
Brady Statistic RA Percent Paced: 5.5 %
Implantable Pulse Generator Model: 2210
Implantable Pulse Generator Serial Number: 7458795
Lead Channel Impedance Value: 412.5 Ohm
Lead Channel Pacing Threshold Amplitude: 0.75 V
Lead Channel Pacing Threshold Amplitude: 0.75 V
Lead Channel Pacing Threshold Pulse Width: 0.4 ms
Lead Channel Pacing Threshold Pulse Width: 0.4 ms
Lead Channel Sensing Intrinsic Amplitude: 11.7 mV
Lead Channel Setting Pacing Amplitude: 2 V
MDC IDC MSMT LEADCHNL RA SENSING INTR AMPL: 1.7 mV
MDC IDC MSMT LEADCHNL RV IMPEDANCE VALUE: 537.5 Ohm
MDC IDC SESS DTM: 20160425154032
MDC IDC SET LEADCHNL RV PACING AMPLITUDE: 0.875
MDC IDC SET LEADCHNL RV PACING PULSEWIDTH: 0.4 ms
MDC IDC SET LEADCHNL RV SENSING SENSITIVITY: 2 mV
MDC IDC STAT BRADY RV PERCENT PACED: 99.98 %

## 2015-03-12 MED ORDER — CARVEDILOL 3.125 MG PO TABS: 3.1250 mg | ORAL_TABLET | Freq: Two times a day (BID) | ORAL | Status: DC

## 2015-03-12 MED ORDER — RIVAROXABAN 15 MG PO TABS: 15.0000 mg | ORAL_TABLET | Freq: Every day | ORAL | Status: DC

## 2015-03-12 MED ORDER — FUROSEMIDE 20 MG PO TABS: 20.0000 mg | ORAL_TABLET | Freq: Every day | ORAL | Status: DC

## 2015-03-12 MED ORDER — POTASSIUM CHLORIDE CRYS ER 10 MEQ PO TBCR: 10.0000 meq | EXTENDED_RELEASE_TABLET | Freq: Every day | ORAL | Status: DC

## 2015-03-12 NOTE — Progress Notes (Signed)
Patient Name: Kelly Neal Date of Encounter: 03/12/2015  Primary Care Provider:  Ellsworth Lennox, MD Primary Cardiologist:  Jerilynn Mages. Croitoru, MD   Chief Complaint  81 year old female status post recent hospitalization related to acute on chronic combined systolic and diastolic heart failure who presents for follow-up.  Past Medical History   Past Medical History  Diagnosis Date  . Hyperlipidemia   . Essential hypertension   . DM type 2 (diabetes mellitus, type 2)   . Paroxysmal atrial fibrillation     a. Dx 08/2014;  b. CHA2DS2VASc = 6-->Xarelto started 02/2015.  . Tachy-brady syndrome     a. 04/2013 s/p SJM 2210 Accent DC PPM, Ser#: [id number].  Marland Kitchen Chronic combined systolic and diastolic CHF (congestive heart failure)     a. 02/2015 Echo: EF 40%, mod LVH, Ao sclerosis w/o stenosis, triv MR.  . CKD (chronic kidney disease), stage III    Past Surgical History  Procedure Laterality Date  . Cataract extraction    . Permanent pacemaker insertion N/A 04/20/2013    Procedure: PERMANENT PACEMAKER INSERTION;  Surgeon: Evans Lance, MD;  Location: [facility] CATH LAB;  Service: Cardiovascular;  Laterality: N/A;    Allergies  No Known Allergies  HPI  41 female with the above complex problem list. She was recently hospitalized secondary to dyspnea and volume overload. She was diuresed with about a 5 pound weight loss in a discharge weight of 131 pounds. Recommendation was made prior to discharge to discontinue aspirin and start Xarelto 15 mg daily in the setting of paroxysmal atrial fibrillation, which was noted on device interrogation. The reduced dose was necessary secondary to stage III chronic kidney disease. Unfortunately, at discharge Xarelto was not prescribed. She has been at a rehabilitation and has slowly but surely improving. Her strength is getting better with physical therapy and she is walking with a walker. She has not had any dyspnea or significant lower extremity edema. She has only  been weighed once since being at rehabilitation and says she was 134 pounds. She denies PND, orthopnea, dizziness, syncope, or early satiety. She was seen by geriatric medicine last week and it was noted that she was still on lisinopril HCTZ as well as metformin, both of which were supposed be discontinued at discharge. At that time they were discontinued and she was placed on Lasix 20 mg daily, Coreg 3.125 mg twice a day, and potassium 10 mg daily, which was recommended prior to discharge. Patient is been tolerating these medications well.  Home Medications  Prior to Admission medications   Medication Sig Start Date End Date Taking? Authorizing Provider  carvedilol (COREG) 3.125 MG tablet Take 1 tablet (3.125 mg total) by mouth 2 (two) times daily with a meal. 03/12/15  Yes Rogelia Mire, NP  furosemide (LASIX) 20 MG tablet Take 1 tablet (20 mg total) by mouth daily. 03/12/15  Yes Rogelia Mire, NP  glimepiride (AMARYL) 2 MG tablet Take 1 tablet (2 mg total) by mouth daily before breakfast. 01/06/13  Yes Barton Fanny, MD  potassium chloride (K-DUR,KLOR-CON) 10 MEQ tablet Take 1 tablet (10 mEq total) by mouth daily. 03/12/15  Yes Rogelia Mire, NP  Rivaroxaban (XARELTO) 15 MG TABS tablet Take 1 tablet (15 mg total) by mouth daily with supper. 03/12/15   Rogelia Mire, NP    Review of Systems  Overall, her strength is improving. She's not been having any significant dyspnea on exertion, PND, orthopnea, dizziness, syncope. She does have mild left ankle  edema, which she says is chronic.   All other systems reviewed and are otherwise negative except as noted above.  Physical Exam  VS:  BP 146/64 mmHg  Pulse 68  Ht 5\' 8"  (1.727 m)  Wt 134 lb (60.782 kg)  BMI 20.38 kg/m2  SpO2 95% , BMI Body mass index is 20.38 kg/(m^2). GEN: Well nourished, well developed, in no acute distress. HEENT: normal. Neck: Supple, no JVD, carotid bruits, or masses. Cardiac: RRR,  soft  systolic murmur throughout, no rubs, or gallops. No clubbing, cyanosis. Trace left ankle edema.  Radials/DP/PT 1+ and equal bilaterally.  Respiratory:  Respirations regular and unlabored, clear to auscultation bilaterally. GI: Soft, nontender, nondistended, BS + x 4. MS: no deformity or atrophy. Skin: warm and dry, no rash. Neuro:  Strength and sensation are intact. Psych: Normal affect.  Accessory Clinical Findings  F/u cbc and bmet in 1 week.  Assessment & Plan  1.  Chronic combined systolic and diastolic chf:  S/p recent hospitalization during which she was successfully diuresed with symptomatic improvement. She has been at rehabilitation since and has been slowly but surely getting stronger. She has not been having any dyspnea or significant lower extremity edema, though she does have mild, chronic left malleolar or edema. She requires daily weights and this has not been being performed at rehabilitation. I have written recommendations for them on their paperwork today. As her medication regimen was changed late last week to include carvedilol, potassium, and Lasix, she should also have a follow-up basic metabolic panel next week. This can be drawn at rehabilitation if possible.  2. Paroxysmal atrial fibrillation: This was noted on interrogation of device during recent hospitalization. Recommendation was made to discontinue aspirin and start Xarelto at 15 mg daily. For some reason this was not prescribed at discharge. I have spoken with the patient and family member today and they have requested that Xarelto be started as previously discussed. They are not sure as to why it was not started previously. I reviewed her most recent labs and given renal insufficiency, I have prescribed Xarelto 15 mg daily.  I  have requested a CBC in 1 week, which just like to basic metabolic profile, can hopefully be accomplished at rehabilitation.  3. Hypertension: Stable on carvedilol and Lasix.  4. Stage III  chronic kidney disease: Follow-up basic metabolic panel in 1 week since she has been switched to Lasix and lisinopril has been discontinued recently.  5. Diabetes mellitus: She is currently on Amaryl and followed by primary care.  6. Disposition: Follow-up with Dr. Sallyanne Kuster or APP in 1 month or sooner if necessary.  Murray Hodgkins, NP 03/12/2015, 5:26 PM

## 2015-03-12 NOTE — Assessment & Plan Note (Signed)
Noted  

## 2015-03-12 NOTE — Patient Instructions (Addendum)
Medication Instructions:  Your physician has recommended you make the following change in your medication:  1)STOP Aspirin  2) START Xarelto one 15 mg tablet daily with dinner (samples provided)  Labwork: NONE  Testing/Procedures: NONE  Follow-Up: Your physician recommends that you schedule a follow-up appointment in: 2-3 months with Dr. Sallyanne Kuster.  Remote monitoring is used to monitor your Pacemaker or ICD from home. This monitoring reduces the number of office visits required to check your device to one time per year. It allows [place] to keep an eye on the functioning of your device to ensure it is working properly. You are scheduled for a device check from home on 06/11/2015. You may send your transmission at any time that day. If you have a wireless device, the transmission will be sent automatically. After your physician reviews your transmission, you will receive a postcard with your next transmission date.   Any Other Special Instructions Will Be Listed Below (If Applicable).

## 2015-03-12 NOTE — Progress Notes (Signed)
Pacemaker check in clinic. Normal device function.   See PaceArt report for details.   Merlin 06/11/15.  Debroah Loop, RN

## 2015-03-15 ENCOUNTER — Encounter: Payer: Self-pay | Admitting: *Deleted

## 2015-03-16 ENCOUNTER — Other Ambulatory Visit: Payer: Self-pay

## 2015-03-16 MED ORDER — FUROSEMIDE 20 MG PO TABS: 20.0000 mg | ORAL_TABLET | Freq: Every day | ORAL | Status: DC

## 2015-03-16 MED ORDER — POTASSIUM CHLORIDE CRYS ER 10 MEQ PO TBCR: 10.0000 meq | EXTENDED_RELEASE_TABLET | Freq: Every day | ORAL | Status: DC

## 2015-03-16 MED ORDER — CARVEDILOL 3.125 MG PO TABS: 3.1250 mg | ORAL_TABLET | Freq: Two times a day (BID) | ORAL | Status: DC

## 2015-03-16 MED ORDER — RIVAROXABAN 15 MG PO TABS: 15.0000 mg | ORAL_TABLET | Freq: Every day | ORAL | Status: DC

## 2015-03-19 ENCOUNTER — Telehealth: Payer: Self-pay

## 2015-03-19 NOTE — Telephone Encounter (Addendum)
[name] states her mom is at [facility], will be released soon but only when she have made an appt. Have made an appt with Dr.McPherson on 03/28/15 and need [place] to call or fax [facility] to let them know. Please fax to [phone] and you can reach [name] at [phone] if questions

## 2015-03-20 NOTE — Telephone Encounter (Signed)
Faxed

## 2015-03-23 ENCOUNTER — Encounter: Payer: Self-pay | Admitting: Internal Medicine

## 2015-03-23 ENCOUNTER — Non-Acute Institutional Stay (SKILLED_NURSING_FACILITY): Payer: Medicare Other | Admitting: Internal Medicine

## 2015-03-23 ENCOUNTER — Telehealth: Payer: Self-pay | Admitting: Cardiology

## 2015-03-23 DIAGNOSIS — I480 Paroxysmal atrial fibrillation: Secondary | ICD-10-CM | POA: Diagnosis not present

## 2015-03-23 DIAGNOSIS — Z950 Presence of cardiac pacemaker: Secondary | ICD-10-CM

## 2015-03-23 DIAGNOSIS — N184 Chronic kidney disease, stage 4 (severe): Secondary | ICD-10-CM

## 2015-03-23 DIAGNOSIS — I5042 Chronic combined systolic (congestive) and diastolic (congestive) heart failure: Secondary | ICD-10-CM

## 2015-03-23 DIAGNOSIS — E785 Hyperlipidemia, unspecified: Secondary | ICD-10-CM

## 2015-03-23 DIAGNOSIS — R609 Edema, unspecified: Secondary | ICD-10-CM

## 2015-03-23 DIAGNOSIS — E118 Type 2 diabetes mellitus with unspecified complications: Secondary | ICD-10-CM | POA: Diagnosis not present

## 2015-03-23 DIAGNOSIS — I10 Essential (primary) hypertension: Secondary | ICD-10-CM | POA: Diagnosis not present

## 2015-03-23 NOTE — Telephone Encounter (Signed)
Clarification requested by [facility] on potassium dosage & whether generic OK. Advised based on order written.

## 2015-03-23 NOTE — Progress Notes (Signed)
MRN: [id number] Name: Kelly Neal  Sex: female Age: 81 y.o. DOB: 1928-07-06  [place] [facility] Facility/Room: 114 Level Of Care: SNF Provider: Inocencio Homes D Emergency Contacts: Extended Emergency Contact Information Primary Emergency Contact: Jacklynn Barnacle of [place] Phone: [phone] Mobile Phone: [phone] Relation: Daughter  Code Status: DNR  Allergies: Review of patient's allergies indicates no known allergies.  Chief Complaint  Patient presents with  . Discharge Note    HPI: Patient is 81 y.o. female who was admitted to SNF after hospitalization for bi-ventricular failure is now stable for d/c to home.  Past Medical History  Diagnosis Date  . Hyperlipidemia   . Essential hypertension   . DM type 2 (diabetes mellitus, type 2)   . Paroxysmal atrial fibrillation     a. Dx 08/2014;  b. CHA2DS2VASc = 6-->Xarelto started 02/2015.  . Tachy-brady syndrome     a. 04/2013 s/p SJM 2210 [employer] DC PPM, Ser#: [id number].  Marland Kitchen Chronic combined systolic and diastolic CHF (congestive heart failure)     a. 02/2015 Echo: EF 40%, mod LVH, Ao sclerosis w/o stenosis, triv MR.  . CKD (chronic kidney disease), stage III     Past Surgical History  Procedure Laterality Date  . Cataract extraction    . Permanent pacemaker insertion N/A 04/20/2013    Procedure: PERMANENT PACEMAKER INSERTION;  Surgeon: Evans Lance, MD;  Location: [facility] CATH LAB;  Service: Cardiovascular;  Laterality: N/A;      Medication List       This list is accurate as of: 03/23/15  3:40 PM.  Always use your most recent med list.               acetaminophen 325 MG tablet  Commonly known as:  TYLENOL  Take 650 mg by mouth every 6 (six) hours as needed for mild pain.     carvedilol 3.125 MG tablet  Commonly known as:  COREG  Take 1 tablet (3.125 mg total) by mouth 2 (two) times daily with a meal.     furosemide 20 MG tablet  Commonly known as:  LASIX  Take 1 tablet (20 mg total) by  mouth daily.     glimepiride 2 MG tablet  Commonly known as:  AMARYL  Take 1 tablet (2 mg total) by mouth daily before breakfast.     GLUCERNA PO  Take 1 Can by mouth as needed.     Melatonin 3 MG Tabs  Take 1 tablet by mouth at bedtime.     potassium chloride 10 MEQ tablet  Commonly known as:  K-DUR,KLOR-CON  Take 1 tablet (10 mEq total) by mouth daily.     Rivaroxaban 15 MG Tabs tablet  Commonly known as:  XARELTO  Take 1 tablet (15 mg total) by mouth daily with supper.        No orders of the defined types were placed in this encounter.    Immunization History  Administered Date(s) Administered  . PPD Test 03/07/2015    History  Substance Use Topics  . Smoking status: Former Smoker -- 2 years    Types: Cigarettes    Quit date: 11/17/1950  . Smokeless tobacco: Not on file  . Alcohol Use: No    Filed Vitals:   03/23/15 1528  BP: 130/70  Pulse: 78  Temp: 97.1 F (36.2 C)  Resp: 18    Physical Exam  GENERAL APPEARANCE: Alert, conversant. No acute distress.  HEENT: Unremarkable. RESPIRATORY: Breathing is even, unlabored.  Lung sounds are clear   CARDIOVASCULAR: Heart RRR no murmurs, rubs or gallops. No peripheral edema.  GASTROINTESTINAL: Abdomen is soft, non-tender, not distended w/ normal bowel sounds.  NEUROLOGIC: Cranial nerves 2-12 grossly intact. Moves all extremities  Patient Active Problem List   Diagnosis Date Noted  . CKD (chronic kidney disease), stage III   . Tachy-brady syndrome   . Essential hypertension   . CKD (chronic kidney disease) stage 4, GFR 15-29 ml/min 03/09/2015  . Elevated troponin   . Peripheral edema   . Proteinuria   . Chronic combined systolic and diastolic CHF (congestive heart failure) 03/03/2015  . Paroxysmal atrial fibrillation 09/05/2014  . Dizzy spells 05/26/2013  . Cardiac pacemaker in situ-St Jude implanted 04/20/13 04/21/2013  . Second degree atrioventricular block, Mobitz (type) I 04/19/2013  . Syncope  04/19/2013  . Symptomatic bradycardia 04/19/2013  . Hyperlipidemia   . Hypertension   . DM (diabetes mellitus), type 2 with complications     CBC    Component Value Date/Time   WBC 7.5 03/04/2015 0042   WBC 8.4 07/22/2013 1741   RBC 4.73 03/04/2015 0042   RBC 3.95* 07/22/2013 1741   HGB 14.5 03/04/2015 0042   HGB 11.8* 07/22/2013 1741   HCT 41.7 03/04/2015 0042   HCT 37.3* 07/22/2013 1741   PLT 220 03/04/2015 0042   MCV 88.2 03/04/2015 0042   MCV 94.5 07/22/2013 1741   LYMPHSABS 2.2 03/03/2015 1347   MONOABS 0.8 03/03/2015 1347   EOSABS 0.2 03/03/2015 1347   BASOSABS 0.0 03/03/2015 1347    CMP     Component Value Date/Time   NA 136 03/07/2015 0336   K 4.0 03/07/2015 0336   CL 98 03/07/2015 0336   CO2 26 03/07/2015 0336   GLUCOSE 169* 03/07/2015 0336   BUN 38* 03/07/2015 0336   CREATININE 1.39* 03/07/2015 0336   CREATININE 1.15* 03/03/2015 1221   CALCIUM 8.7 03/07/2015 0336   PROT 6.0 03/03/2015 1221   ALBUMIN 2.8* 03/07/2015 0336   AST 11 03/03/2015 1221   ALT 12 03/03/2015 1221   ALKPHOS 59 03/03/2015 1221   BILITOT 0.9 03/03/2015 1221   GFRNONAA 34* 03/07/2015 0336   GFRAA 39* 03/07/2015 0336    Assessment and Plan  Pt is d/c to home with HH/OT/PT/nursing.DME - none.  Hennie Duos, MD

## 2015-03-26 ENCOUNTER — Encounter: Payer: Self-pay | Admitting: Cardiovascular Disease

## 2015-03-26 ENCOUNTER — Encounter: Payer: Self-pay | Admitting: Endocrinology

## 2015-03-26 ENCOUNTER — Ambulatory Visit (INDEPENDENT_AMBULATORY_CARE_PROVIDER_SITE_OTHER): Payer: Medicare Other | Admitting: Endocrinology

## 2015-03-26 VITALS — BP 142/94 | HR 73 | Temp 98.0°F | Wt 134.0 lb

## 2015-03-26 DIAGNOSIS — E118 Type 2 diabetes mellitus with unspecified complications: Secondary | ICD-10-CM

## 2015-03-26 MED ORDER — GLUCOSE BLOOD VI STRP: 1.0000 | ORAL_STRIP | Freq: Two times a day (BID) | Status: DC

## 2015-03-26 MED ORDER — INSULIN DETEMIR 100 UNIT/ML FLEXPEN: 10.0000 [IU] | PEN_INJECTOR | SUBCUTANEOUS | Status: DC

## 2015-03-26 MED ORDER — INSULIN DETEMIR 100 UNIT/ML FLEXPEN
10.0000 [IU] | PEN_INJECTOR | SUBCUTANEOUS | Status: DC
Start: 2015-03-26 — End: 2015-03-27

## 2015-03-26 NOTE — Patient Instructions (Addendum)
good diet and exercise significantly improve the control of your diabetes.  please let me know if you wish to be referred to a dietician.  high blood sugar is very risky to your health.  you should see an eye doctor and dentist every year.  It is very important to get all recommended vaccinations.  controlling your blood pressure and cholesterol drastically reduces the damage diabetes does to your body.  Those who smoke should quit.  please discuss these with your doctor.  check your blood sugar twice a day.  vary the time of day when you check, between before the 3 meals, and at bedtime.  also check if you have symptoms of your blood sugar being too high or too low.  please keep a record of the readings and bring it to your next appointment here.  You can write it on any piece of paper.  please call us sooner if your blood sugar goes below 70, or if you have a lot of readings over 200. At our office, we are fortunate to have two specialists who are happy to help you:   Leonia Reader, RN, CDE, is a diabetes educator and pump trainer.  She is here on Monday mornings, and all day Tuesday and Wednesday.  She is can help you with low blood sugar avoidance and treatment, injecting insulin, sick day management, and others.   Antonieta Iba, RD is our dietician.  She is here all day Thursday and Friday.  She can advise you about a healthy diet.  She can also help you about a variety of special diabetes situations, such as shift work, [place], gluten-free, diet for kidney patients, and traveling with diabetes.   i have sent a prescription to your pharmacy, for strips.   i have also sent a prescription to your pharmacy, to start 10 units of insulin each morning. Please call us in 3-4 days, to tell us how the blood sugar is doing. Please come back for a follow-up appointment in 2 weeks.

## 2015-03-26 NOTE — Progress Notes (Signed)
Subjective:    Patient ID: Kelly Neal, female    DOB: December 18, 1927, 81 y.o.   MRN: [id number]  HPI Hx is from pt and dtr (pt lives with her and son); pt states DM was dx'ed in 2008; she has mild if any neuropathy of the lower extremities; she has associated renal insufficiency; she has never been on insulin: pt says her diet and exercise are limited by health problems; she has never had GDM, pancreatitis, severe hypoglycemia or DKA.  Pt says she has not recently checked cbg's.   Past Medical History  Diagnosis Date  . Hyperlipidemia   . Essential hypertension   . DM type 2 (diabetes mellitus, type 2)   . Paroxysmal atrial fibrillation     a. Dx 08/2014;  b. CHA2DS2VASc = 6-->Xarelto started 02/2015.  . Tachy-brady syndrome     a. 04/2013 s/p SJM 2210 Accent DC PPM, Ser#: [id number].  Marland Kitchen Chronic combined systolic and diastolic CHF (congestive heart failure)     a. 02/2015 Echo: EF 40%, mod LVH, Ao sclerosis w/o stenosis, triv MR.  . CKD (chronic kidney disease), stage III     Past Surgical History  Procedure Laterality Date  . Cataract extraction    . Permanent pacemaker insertion N/A 04/20/2013    Procedure: PERMANENT PACEMAKER INSERTION;  Surgeon: Evans Lance, MD;  Location: [facility] CATH LAB;  Service: Cardiovascular;  Laterality: N/A;    History   Social History  . Marital Status: Married    Spouse Name: N/A  . Number of Children: N/A  . Years of Education: N/A   Occupational History  . Not on file.   Social History Main Topics  . Smoking status: Former Smoker -- 2 years    Types: Cigarettes    Quit date: 11/17/1950  . Smokeless tobacco: Not on file  . Alcohol Use: No  . Drug Use: No  . Sexual Activity: No   Other Topics Concern  . Not on file   Social History Narrative    Current Outpatient Prescriptions on File Prior to Visit  Medication Sig Dispense Refill  . acetaminophen (TYLENOL) 325 MG tablet Take 650 mg by mouth every 6 (six) hours as needed for mild pain.     Marland Kitchen ALPRAZolam (XANAX) 0.25 MG tablet Take 0.25 mg by mouth at bedtime.    . carvedilol (COREG) 3.125 MG tablet Take 1 tablet (3.125 mg total) by mouth 2 (two) times daily with a meal. 180 tablet 3  . furosemide (LASIX) 20 MG tablet Take 1 tablet (20 mg total) by mouth daily. 90 tablet 3  . glimepiride (AMARYL) 2 MG tablet Take 1 tablet (2 mg total) by mouth daily before breakfast. 90 tablet 2  . Melatonin 3 MG TABS Take 1 tablet by mouth at bedtime.    . potassium chloride (K-DUR,KLOR-CON) 10 MEQ tablet Take 1 tablet (10 mEq total) by mouth daily. 90 tablet 3  . Rivaroxaban (XARELTO) 15 MG TABS tablet Take 1 tablet (15 mg total) by mouth daily with supper. 90 tablet 3  . sennosides-docusate sodium (SENOKOT-S) 8.6-50 MG tablet Take 1 tablet by mouth daily.     No current facility-administered medications on file prior to visit.    No Known Allergies  Family History  Problem Relation Age of Onset  . Hypertension Mother   . Heart attack Father   . Cancer Brother   . Diabetes Neg Hx     BP 142/94 mmHg  Pulse 73  Temp(Src) 98 F (  36.7 C) (Oral)  Wt 134 lb (60.782 kg)  SpO2 95%   Review of Systems denies weight loss, headache, chest pain, sob, n/v, muscle cramps, excessive diaphoresis, memory loss, and cold intolerance.  she has slight blurry vision, rhinorrhea, easy bruising, and urinary frequency.    Objective:   Physical Exam VS: see vs page GEN: no distress HEAD: head: no deformity eyes: no periorbital swelling, no proptosis external nose and ears are normal mouth: no lesion seen NECK: supple, thyroid is not enlarged CHEST WALL: no deformity LUNGS:  Clear to auscultation CV: reg rate and rhythm, no murmur ABD: abdomen is soft, nontender.  no hepatosplenomegaly.  not distended.  no hernia MUSCULOSKELETAL: muscle bulk and strength are grossly normal.  no obvious joint swelling.  gait is steady with a walker.  EXTEMITIES: no deformity.  no ulcer on the feet.  feet are of  normal color and temp.  no edema PULSES: dorsalis pedis intact bilat.  no carotid bruit NEURO:  cn 2-12 grossly intact.   readily moves all 4's.  sensation is intact to touch on the feet SKIN:  Normal texture and temperature.  No rash or suspicious lesion is visible.   NODES:  None palpable at the neck PSYCH: alert, well-oriented.  Does not appear anxious nor depressed.   Lab Results  Component Value Date   HGBA1C 12.4* 03/04/2015       Assessment & Plan:  DM: severe exacerbation.  She is probably evolving type 1 DM. Blurry vision, new, prob due to severe hyperglycemia.  Pt is advised to continue to f/u with opthal.    Patient is advised the following: Patient Instructions  good diet and exercise significantly improve the control of your diabetes.  please let me know if you wish to be referred to a dietician.  high blood sugar is very risky to your health.  you should see an eye doctor and dentist every year.  It is very important to get all recommended vaccinations.  controlling your blood pressure and cholesterol drastically reduces the damage diabetes does to your body.  Those who smoke should quit.  please discuss these with your doctor.  check your blood sugar twice a day.  vary the time of day when you check, between before the 3 meals, and at bedtime.  also check if you have symptoms of your blood sugar being too high or too low.  please keep a record of the readings and bring it to your next appointment here.  You can write it on any piece of paper.  please call us sooner if your blood sugar goes below 70, or if you have a lot of readings over 200. At our office, we are fortunate to have two specialists who are happy to help you:   Leonia Reader, RN, CDE, is a diabetes educator and pump trainer.  She is here on Monday mornings, and all day Tuesday and Wednesday.  She is can help you with low blood sugar avoidance and treatment, injecting insulin, sick day management, and others.     Antonieta Iba, RD is our dietician.  She is here all day Thursday and Friday.  She can advise you about a healthy diet.  She can also help you about a variety of special diabetes situations, such as shift work, [place], gluten-free, diet for kidney patients, and traveling with diabetes.   i have sent a prescription to your pharmacy, for strips.   i have also sent a prescription to your  pharmacy, to start 10 units of insulin each morning. Please call us in 3-4 days, to tell us how the blood sugar is doing. Please come back for a follow-up appointment in 2 weeks.

## 2015-03-27 ENCOUNTER — Telehealth: Payer: Self-pay | Admitting: Endocrinology

## 2015-03-27 MED ORDER — GLUCOSE BLOOD VI STRP: 1.0000 | ORAL_STRIP | Freq: Two times a day (BID) | Status: DC

## 2015-03-27 MED ORDER — INSULIN DETEMIR 100 UNIT/ML FLEXPEN: 10.0000 [IU] | PEN_INJECTOR | SUBCUTANEOUS | Status: DC

## 2015-03-27 NOTE — Telephone Encounter (Signed)
I contacted the pt's daughter and advised the rx for levemir has been sent to [facility].

## 2015-03-27 NOTE — Telephone Encounter (Signed)
Patient need insulin sent to [facility] [address], patient daughter would like to talk to you

## 2015-03-28 ENCOUNTER — Telehealth: Payer: Self-pay | Admitting: Endocrinology

## 2015-03-28 ENCOUNTER — Telehealth: Payer: Self-pay | Admitting: Cardiovascular Disease

## 2015-03-28 ENCOUNTER — Ambulatory Visit: Payer: Medicare Other | Admitting: Family Medicine

## 2015-03-28 NOTE — Telephone Encounter (Signed)
I contacted the pt's daughter. She stated today she administered the first insulin injection successfully. Pt tolerated the injection well. Patients daughter at this time did not have any further questions.

## 2015-03-28 NOTE — Telephone Encounter (Signed)
New message        Pt received a letter from medicare about getting xarelto approved   please give pt a call

## 2015-03-28 NOTE — Telephone Encounter (Signed)
Pt.s daughter informed that she would need to talk with her pharmacy and they would need to send [place] a PA letter

## 2015-03-28 NOTE — Telephone Encounter (Signed)
Zigmund Daniel calling from [place]  Stated that patient daughter need teaching on how to admister insulin to mom, please advise [phone]

## 2015-03-30 ENCOUNTER — Other Ambulatory Visit: Payer: Self-pay

## 2015-03-30 ENCOUNTER — Telehealth: Payer: Self-pay | Admitting: Endocrinology

## 2015-03-30 MED ORDER — BAYER CONTOUR NEXT MONITOR W/DEVICE KIT: PACK | Status: AC

## 2015-03-30 NOTE — Telephone Encounter (Signed)
Pt's daughter advised the rx for test strips and meter have been sent.

## 2015-03-30 NOTE — Telephone Encounter (Signed)
Debbie simpson from [place], need to get orders to help patient with her diabetic needs, please advise [phone]

## 2015-03-30 NOTE — Telephone Encounter (Signed)
See note below and please advise, Thanks! 

## 2015-03-30 NOTE — Telephone Encounter (Signed)
Please decline this request.

## 2015-03-30 NOTE — Telephone Encounter (Signed)
Please call in new rx for meter and test strips for contour next

## 2015-04-04 ENCOUNTER — Encounter: Payer: Self-pay | Admitting: Endocrinology

## 2015-04-04 ENCOUNTER — Encounter: Payer: Medicare Other | Attending: Endocrinology | Admitting: Nutrition

## 2015-04-04 ENCOUNTER — Ambulatory Visit (INDEPENDENT_AMBULATORY_CARE_PROVIDER_SITE_OTHER): Payer: Medicare Other | Admitting: Endocrinology

## 2015-04-04 VITALS — BP 124/84 | HR 74 | Temp 97.8°F | Ht 68.0 in | Wt 132.0 lb

## 2015-04-04 DIAGNOSIS — Z794 Long term (current) use of insulin: Secondary | ICD-10-CM | POA: Insufficient documentation

## 2015-04-04 DIAGNOSIS — E118 Type 2 diabetes mellitus with unspecified complications: Secondary | ICD-10-CM | POA: Insufficient documentation

## 2015-04-04 DIAGNOSIS — Z713 Dietary counseling and surveillance: Secondary | ICD-10-CM | POA: Insufficient documentation

## 2015-04-04 NOTE — Patient Instructions (Addendum)
check your blood sugar twice a day.  vary the time of day when you check, between before the 3 meals, and at bedtime.  also check if you have symptoms of your blood sugar being too high or too low.  please keep a record of the readings and bring it to your next appointment here.  You can write it on any piece of paper.  please call us sooner if your blood sugar goes below 70, or if you have a lot of readings over 200. Please continue the same insulin.   Please come back for a follow-up appointment in 2 months.

## 2015-04-04 NOTE — Progress Notes (Signed)
The daughter has been giving her her insulin for the last 5 days.  The patient is wanting to learn how to do this herself.   She had her Levemir pen and needles with her. We reviewed the steps for this and she re demonstrated the procedure accurately, and injected into the ball without any difficulty.   She does not like to test her blood sugars--saying that it is very painful.  She was given a 30 gauge lancet and she likes this.  She was also given a different lancet device to try at home as well. Goals for before meals were given to her at less than 120, and 2hr. pc less than 180.  They reported good understanding of this.  Discussed low blood sugars--symtoms and treatments.  She had no final questions.

## 2015-04-04 NOTE — Progress Notes (Signed)
Subjective:    Patient ID: Kelly Neal, female    DOB: 1928-02-26, 81 y.o.   MRN: [id number]  HPI Pt returns for f/u of diabetes mellitus: DM type: Insulin-requiring type 2 Dx'ed: 0093 Complications: renal insufficiency Therapy: insulin since early 2016 GDM: never DKA: never Severe hypoglycemia: never Pancreatitis: never Other: she chose a qd insulin regimen Interval history: Pt get 10 units qam.  dtr brings a record of her cbg's which i have reviewed today.  All are in the 100's.  It is in general higher as the day goes on.  No recent steroids Past Medical History  Diagnosis Date  . Hyperlipidemia   . Essential hypertension   . DM type 2 (diabetes mellitus, type 2)   . Paroxysmal atrial fibrillation     a. Dx 08/2014;  b. CHA2DS2VASc = 6-->Xarelto started 02/2015.  . Tachy-brady syndrome     a. 04/2013 s/p SJM 2210 Accent DC PPM, Ser#: [id number].  Marland Kitchen Chronic combined systolic and diastolic CHF (congestive heart failure)     a. 02/2015 Echo: EF 40%, mod LVH, Ao sclerosis w/o stenosis, triv MR.  . CKD (chronic kidney disease), stage III     Past Surgical History  Procedure Laterality Date  . Cataract extraction    . Permanent pacemaker insertion N/A 04/20/2013    Procedure: PERMANENT PACEMAKER INSERTION;  Surgeon: Evans Lance, MD;  Location: [facility] CATH LAB;  Service: Cardiovascular;  Laterality: N/A;    History   Social History  . Marital Status: Married    Spouse Name: N/A  . Number of Children: N/A  . Years of Education: N/A   Occupational History  . Not on file.   Social History Main Topics  . Smoking status: Former Smoker -- 2 years    Types: Cigarettes    Quit date: 11/17/1950  . Smokeless tobacco: Not on file  . Alcohol Use: No  . Drug Use: No  . Sexual Activity: No   Other Topics Concern  . Not on file   Social History Narrative    Current Outpatient Prescriptions on File Prior to Visit  Medication Sig Dispense Refill  . acetaminophen (TYLENOL)  325 MG tablet Take 650 mg by mouth every 6 (six) hours as needed for mild pain.    Marland Kitchen ALPRAZolam (XANAX) 0.25 MG tablet Take 0.25 mg by mouth at bedtime.    . Blood Glucose Monitoring Suppl (BAYER CONTOUR NEXT MONITOR) W/DEVICE KIT Use to check blood sugar 2 times per day 1 kit 2  . carvedilol (COREG) 3.125 MG tablet Take 1 tablet (3.125 mg total) by mouth 2 (two) times daily with a meal. 180 tablet 3  . furosemide (LASIX) 20 MG tablet Take 1 tablet (20 mg total) by mouth daily. 90 tablet 3  . glimepiride (AMARYL) 2 MG tablet Take 1 tablet (2 mg total) by mouth daily before breakfast. 90 tablet 2  . glucose blood (BAYER CONTOUR NEXT TEST) test strip 1 each by Other route 2 (two) times daily. And lancets 2/day 100 each 1  . Insulin Detemir (LEVEMIR FLEXTOUCH) 100 UNIT/ML Pen Inject 10 Units into the skin every morning. And pen needles 1/day 15 mL 3  . Melatonin 3 MG TABS Take 1 tablet by mouth at bedtime.    . potassium chloride (K-DUR,KLOR-CON) 10 MEQ tablet Take 1 tablet (10 mEq total) by mouth daily. 90 tablet 3  . Rivaroxaban (XARELTO) 15 MG TABS tablet Take 1 tablet (15 mg total) by mouth daily with supper.  90 tablet 3  . sennosides-docusate sodium (SENOKOT-S) 8.6-50 MG tablet Take 1 tablet by mouth daily.     No current facility-administered medications on file prior to visit.    No Known Allergies  Family History  Problem Relation Age of Onset  . Hypertension Mother   . Heart attack Father   . Cancer Brother   . Diabetes Neg Hx     BP 124/84 mmHg  Pulse 74  Temp(Src) 97.8 F (36.6 C) (Oral)  Ht 5' 8"  (1.727 m)  Wt 132 lb (59.875 kg)  BMI 20.08 kg/m2  SpO2 97%  Review of Systems She denies hypoglycemia    Objective:   Physical Exam VITAL SIGNS:  See vs page GENERAL: no distress Pulses: dorsalis pedis intact bilat.   MSK: no deformity of the feet CV: 1+ left leg edema (trace on the right). Skin:  no ulcer on the feet.  normal color and temp on the feet. Neuro:  sensation is intact to touch on the feet      Assessment & Plan:  DM: glycemic control is much better  Patient is advised the following: Patient Instructions  check your blood sugar twice a day.  vary the time of day when you check, between before the 3 meals, and at bedtime.  also check if you have symptoms of your blood sugar being too high or too low.  please keep a record of the readings and bring it to your next appointment here.  You can write it on any piece of paper.  please call us sooner if your blood sugar goes below 70, or if you have a lot of readings over 200. Please continue the same insulin.   Please come back for a follow-up appointment in 2 months.

## 2015-04-05 ENCOUNTER — Telehealth: Payer: Self-pay

## 2015-04-05 NOTE — Telephone Encounter (Signed)
Yes, you can authorize occupational therapy on my behalf. Thanks.

## 2015-04-05 NOTE — Telephone Encounter (Signed)
Mallory from [facility] is calling because they verbal orders from Dr. Leward Quan for occupational therapy. Please call! [phone]

## 2015-04-05 NOTE — Telephone Encounter (Signed)
Occupational therapist Mallory with [facility] called. She is requesting verbal orders for occupational therapy with the patient for twice a week for 4 weeks.  Please advise, Thanks!

## 2015-04-05 NOTE — Telephone Encounter (Signed)
Please refer request to pcp

## 2015-04-05 NOTE — Telephone Encounter (Signed)
Ok to give orders  °

## 2015-04-05 NOTE — Telephone Encounter (Signed)
Left voicemail advising Mallory with [place] about verbal orders. Requested call back if Occupational therapist would like to discuss.

## 2015-04-06 ENCOUNTER — Telehealth: Payer: Self-pay | Admitting: Cardiovascular Disease

## 2015-04-06 LAB — BASIC METABOLIC PANEL
BUN: 39 mg/dL — AB (ref 4–21)
CREATININE: 1.2 mg/dL — AB (ref <=1.1)
Glucose: 175 mg/dL
Potassium: 5.3 mmol/L (ref 3.4–5.3)
SODIUM: 137 mmol/L (ref 137–147)

## 2015-04-06 LAB — CBC AND DIFFERENTIAL
HCT: 36 % (ref 36–46)
Hemoglobin: 12.4 g/dL (ref 12.0–16.0)
PLATELETS: 266 10*3/uL (ref 150–399)
WBC: 8 10^3/mL

## 2015-04-06 LAB — HEPATIC FUNCTION PANEL
ALT: 17 U/L (ref 7–35)
AST: 17 U/L (ref 13–35)
Alkaline Phosphatase: 54 U/L (ref 25–125)
Bilirubin, Total: 0.4 mg/dL

## 2015-04-06 NOTE — Telephone Encounter (Signed)
Pt needs prior authorization for her Xarelto. Please call to [phone] -Her prescription is at [facility].She need this asap.

## 2015-04-06 NOTE — Telephone Encounter (Signed)
Left order on her voicemail.

## 2015-04-09 ENCOUNTER — Telehealth: Payer: Self-pay

## 2015-04-09 NOTE — Telephone Encounter (Signed)
Pt was seen for physical therapy evaluation.  Her frequency is 1 time per week for 1 week 2 times per week for 6 weeks  [place] needs a verbal approval

## 2015-04-09 NOTE — Telephone Encounter (Signed)
PA done through [facility] 04/06/15

## 2015-04-09 NOTE — Telephone Encounter (Signed)
I left a message for Gracie giving verbal authorization for PT w/ [facility].

## 2015-04-10 ENCOUNTER — Telehealth: Payer: Self-pay

## 2015-04-10 NOTE — Telephone Encounter (Signed)
PA FOR XARELTO 15MG  TABS APPROVED BY [employer] THROUGH 04/04/2016.

## 2015-04-10 NOTE — Telephone Encounter (Signed)
Dr. Leward Quan,  Pts daughter is calling to see if you would be able to put in orders for physical therapy, and a home nurse for her.   Best# [phone]

## 2015-04-11 NOTE — Telephone Encounter (Signed)
I have not seen this pt in > 2 years. She has been seen recently on several occasions by Dr. Renato Shin in the [facility]. The Home Health referral requires that the pt has been seen in our clinic, by me or a provider supervised by me, in a face-to-face encounter. I cannot attest to that. The daughter needs to contact Dr. Cordelia Pen clinic or the Cardiology clinic where she has also had multiple visits recently.

## 2015-04-11 NOTE — Telephone Encounter (Signed)
Called daughter back. No answer, voicemail full.

## 2015-04-12 ENCOUNTER — Encounter: Payer: Self-pay | Admitting: Family Medicine

## 2015-04-12 ENCOUNTER — Ambulatory Visit (INDEPENDENT_AMBULATORY_CARE_PROVIDER_SITE_OTHER): Payer: Medicare Other | Admitting: Family Medicine

## 2015-04-12 VITALS — BP 144/74 | HR 66 | Temp 98.3°F | Resp 16 | Ht 66.75 in | Wt 133.2 lb

## 2015-04-12 DIAGNOSIS — Z9181 History of falling: Secondary | ICD-10-CM

## 2015-04-12 DIAGNOSIS — R296 Repeated falls: Secondary | ICD-10-CM

## 2015-04-12 DIAGNOSIS — R42 Dizziness and giddiness: Secondary | ICD-10-CM

## 2015-04-12 DIAGNOSIS — K5901 Slow transit constipation: Secondary | ICD-10-CM | POA: Diagnosis not present

## 2015-04-12 DIAGNOSIS — Z029 Encounter for administrative examinations, unspecified: Secondary | ICD-10-CM | POA: Diagnosis not present

## 2015-04-12 DIAGNOSIS — G479 Sleep disorder, unspecified: Secondary | ICD-10-CM | POA: Diagnosis not present

## 2015-04-12 MED ORDER — ALPRAZOLAM 0.25 MG PO TABS: ORAL_TABLET | ORAL | Status: DC

## 2015-04-12 MED ORDER — ALPRAZOLAM 0.25 MG PO TABS: 0.2500 mg | ORAL_TABLET | Freq: Every day | ORAL | Status: DC

## 2015-04-12 NOTE — Patient Instructions (Addendum)
[facility]  Ph# : [phone]. They are accepting new patients.  I have suggested that you transition your care to that practice; they specialize in Geriatrics and in-house PT and other services that you may need.  Take care and all teh best to you and your family.

## 2015-04-12 NOTE — Telephone Encounter (Signed)
Patient in office with Dr Leward Quan she will go over message with patient daughter

## 2015-04-15 NOTE — Progress Notes (Signed)
Subjective:    Patient ID: Kelly Neal, female    DOB: Jul 27, 1928, 81 y.o.   MRN: [id number]  HPI This 81 y.o. Female is here to day w/ here daughter, Kelly Neal, for hospital f/u. Pt was admitted to SNF on 03/09/15 after hospitalization for bi-ventricular failure. She was discharged home on 03/23/2015 w/ daughter as primary care provider. Pt has a pacemaker, inserted by Dr. Cristopher Peru in 2014. Pt has Type II DM (insulin requiring), managed by Dr. Renato Shin at [facility]; she has complication of renal insufficiency. Pt has appt at [facility] on 04/20/2015 w/ Dr. Marval Regal.  Pt and daughter are requesting [facility] referral for PT/OT. Arville Go has been out to the home for evaluation but cannot return w/o formal referral.  I informed daughter that I called and gave verbal authorization to [place]. Pt ambulates w/ a walker but has balance and deconditioning issues. [name] requests a parking permanent placard.  [name] is concerned about pt's chronic sleep disturbance; effectively treated w/ Alprazolam hs. Pt also has intermittent episodes of dizziness, onset after insertion of pacemaker. Pt describes strange sensation like paresthesias, from the neck up along sides of face and scalp; these sensations are unaccompanied by any other symptoms (vision disturbances, HA, n/v, hearing disturbance or ear pain, neck pain, etc).Various specialists have evaluated symptoms w/o arriving at a diagnosis. These sensations make pt uneasy and anxious.  Pt also c/o chronic constipation; daughter reports pt having disimpaction while in hospital. Nutrition is good; meal supplementation w/ Glucerna. Activity- pt is sedentary.  [name] also requests DNR paperwork for signature here in the office and to have at home for placement in a visible spot in the event that EMS is called to the home. The other concern that is voiced is duration of Xarelto therapy; it is expensive and [name] not sure if pt can afford cost  of this med long-term.  Patient Active Problem List   Diagnosis Date Noted  . Essential hypertension   . CKD (chronic kidney disease) stage 4, GFR 15-29 ml/min 03/09/2015  . Peripheral edema   . Proteinuria   . Chronic combined systolic and diastolic CHF (congestive heart failure) 03/03/2015  . Paroxysmal atrial fibrillation 09/05/2014  . Dizzy spells 05/26/2013  . Cardiac pacemaker in situ-[employer] Jude implanted 04/20/13 04/21/2013  . Second degree atrioventricular block, Mobitz (type) I 04/19/2013  . Hyperlipidemia   . DM (diabetes mellitus), type 2 with complications     Prior to Admission medications   Medication Sig Start Date End Date Taking? Authorizing Provider  acetaminophen (TYLENOL) 325 MG tablet Take 650 mg by mouth every 6 (six) hours as needed for mild pain.   Yes Historical Provider, MD  ALPRAZolam Duanne Moron) 0.25 MG tablet Take 1 tablet at bedtime for sleep.   Yes Barton Fanny, MD  Blood Glucose Monitoring Suppl (BAYER CONTOUR NEXT MONITOR) W/DEVICE KIT Use to check blood sugar 2 times per day 03/30/15  Yes Renato Shin, MD  carvedilol (COREG) 3.125 MG tablet Take 1 tablet (3.125 mg total) by mouth 2 (two) times daily with a meal. 03/16/15  Yes Mihai Croitoru, MD  furosemide (LASIX) 20 MG tablet Take 1 tablet (20 mg total) by mouth daily. 03/16/15  Yes Mihai Croitoru, MD  glimepiride (AMARYL) 2 MG tablet Take 1 tablet (2 mg total) by mouth daily before breakfast. 01/06/13  Yes Barton Fanny, MD  glucose blood (BAYER CONTOUR NEXT TEST) test strip 1 each by Other route 2 (two) times daily. And  lancets 2/day 03/27/15  Yes Renato Shin, MD  Melatonin 3 MG TABS Take 1 tablet by mouth at bedtime.   Yes Historical Provider, MD  potassium chloride (K-DUR,KLOR-CON) 10 MEQ tablet Take 1 tablet (10 mEq total) by mouth daily. 03/16/15  Yes Mihai Croitoru, MD  Rivaroxaban (XARELTO) 15 MG TABS tablet Take 1 tablet (15 mg total) by mouth daily with supper. 03/16/15  Yes Mihai Croitoru, MD    Insulin Detemir (LEVEMIR FLEXTOUCH) 100 UNIT/ML Pen Inject 10 Units into the skin every morning. And pen needles 1/day 03/27/15   Renato Shin, MD    Past Surgical History  Procedure Laterality Date  . Cataract extraction    . Permanent pacemaker insertion N/A 04/20/2013    Procedure: PERMANENT PACEMAKER INSERTION;  Surgeon: Evans Lance, MD;  Location: [facility] CATH LAB;  Service: Cardiovascular;  Laterality: N/A;    SOC and FAM HX reviewed.   Review of Systems Per HPI.     Objective:   Physical Exam  Constitutional: She is oriented to person, place, and time. She appears well-developed and well-nourished. No distress.  Blood pressure 144/74, pulse 66, temperature 98.3 F (36.8 C), temperature source Oral, resp. rate 16, height 5' 6.75" (1.695 m), weight 133 lb 3.2 oz (60.419 kg), SpO2 97 %.   HENT:  Head: Normocephalic and atraumatic.  Right Ear: External ear normal.  Left Ear: External ear normal.  Nose: Nose normal.  Mouth/Throat: Oropharynx is clear and moist.  Eyes: Conjunctivae and EOM are normal. No scleral icterus.  Neck: Normal range of motion. Neck supple.  Cardiovascular: Normal rate.   Pulmonary/Chest: Effort normal. No respiratory distress.  Abdominal: Soft. She exhibits no distension. There is no tenderness.  Musculoskeletal: Normal range of motion. She exhibits edema.  L lower leg > R lower leg edema.  Neurological: She is alert and oriented to person, place, and time. No cranial nerve deficit. Coordination normal.  Gait is slow w/ walker.  Skin: Skin is warm and dry. She is not diaphoretic. There is pallor.  Psychiatric: She has a normal mood and affect. Her behavior is normal. Judgment and thought content normal.  Nursing note and vitals reviewed.      Assessment & Plan:  Sleep disturbance- Continue Alprazolam 0.25 mg hs for sleep.  At high risk for falls - Plan: Ambulatory referral to [place] for OT/PT.  Dizzinesses - Trial Alprazolam 0.25 mg  1/2 tab  in AM and at midday as needed to reduce anxiety surrounding these episodes. Plan: Ambulatory referral to [facility]  Slow transit constipation- Recommended OTC Benefiber daily use per package directions.  Administrative encounter- DMV paperwork for placard completed; DNR form completed; copy home w/ pt.   Recommended daughter/pt consider transitioning care to [facility] ADULT MEDICINE (phone number given).

## 2015-04-20 ENCOUNTER — Telehealth: Payer: Self-pay

## 2015-04-20 NOTE — Telephone Encounter (Signed)
Kelly Neal from [facility] called to let us know that [name] had a bp reading of 156 over 76 today. And pt reports consistent diziness since her pacemaker was put in 2 years ago. [phone]

## 2015-04-21 NOTE — Telephone Encounter (Signed)
Called and left message with therapist Terrace Arabia to call back if needed. Called and spoke with pt. She states her BP is usually good, the day before it had been 130s/70s. She is feeling well.  Dizziness is not new. She has had this for several years with workup by several specialists and all negative. Pt is not worried about her dizziness at this time.

## 2015-04-23 ENCOUNTER — Telehealth: Payer: Self-pay | Admitting: Endocrinology

## 2015-04-23 DIAGNOSIS — E785 Hyperlipidemia, unspecified: Secondary | ICD-10-CM

## 2015-04-23 MED ORDER — GLIMEPIRIDE 2 MG PO TABS: 2.0000 mg | ORAL_TABLET | Freq: Every day | ORAL | Status: DC

## 2015-04-23 NOTE — Telephone Encounter (Signed)
Patient need a refill  GLIMEPIRIDE 2 MG TABLET asap if possible. [facility] [address]

## 2015-04-23 NOTE — Telephone Encounter (Signed)
Rx sent to pt's pharmacy

## 2015-04-24 ENCOUNTER — Telehealth: Payer: Self-pay | Admitting: *Deleted

## 2015-04-24 NOTE — Telephone Encounter (Signed)
LMTCB/SSS 

## 2015-04-26 ENCOUNTER — Telehealth: Payer: Self-pay

## 2015-04-26 NOTE — Telephone Encounter (Signed)
Malerie from [facility] is calling to get verbal orders from Dr. Leward Quan. She wanted to speak with someone right now so I transferred her call to [employer]. Malerie was informed that Dr. Leward Quan has retired. Phone: [phone]

## 2015-04-26 NOTE — Telephone Encounter (Signed)
Spoke to [place] who OKd me giving VO to extend pt's OT as req'd by [facility]. LMOM for [facility] w/VO authorization.

## 2015-05-09 ENCOUNTER — Other Ambulatory Visit: Payer: Self-pay | Admitting: Cardiovascular Disease

## 2015-05-31 ENCOUNTER — Encounter: Payer: Self-pay | Admitting: *Deleted

## 2015-06-04 ENCOUNTER — Ambulatory Visit (INDEPENDENT_AMBULATORY_CARE_PROVIDER_SITE_OTHER): Payer: Medicare Other | Admitting: Endocrinology

## 2015-06-04 ENCOUNTER — Telehealth: Payer: Self-pay | Admitting: Endocrinology

## 2015-06-04 ENCOUNTER — Encounter: Payer: Self-pay | Admitting: Endocrinology

## 2015-06-04 VITALS — BP 130/60 | HR 70 | Temp 98.6°F | Ht 66.75 in | Wt 136.0 lb

## 2015-06-04 DIAGNOSIS — E118 Type 2 diabetes mellitus with unspecified complications: Secondary | ICD-10-CM | POA: Diagnosis not present

## 2015-06-04 LAB — POCT GLYCOSYLATED HEMOGLOBIN (HGB A1C): Hemoglobin A1C: 6.5

## 2015-06-04 MED ORDER — GLUCOSE BLOOD VI STRP: 1.0000 | ORAL_STRIP | Freq: Two times a day (BID) | Status: DC

## 2015-06-04 MED ORDER — GLIMEPIRIDE 1 MG PO TABS: 1.0000 mg | ORAL_TABLET | Freq: Every day | ORAL | Status: DC

## 2015-06-04 NOTE — Patient Instructions (Addendum)
check your blood sugar twice a day.  vary the time of day when you check, between before the 3 meals, and at bedtime.  also check if you have symptoms of your blood sugar being too high or too low.  please keep a record of the readings and bring it to your next appointment here.  You can write it on any piece of paper.  please call us sooner if your blood sugar goes below 70, or if you have a lot of readings over 200. Please continue the same insulin, and: Reduce the glimepiride to 1 mg daily. Please come back for a follow-up appointment in 3 months.

## 2015-06-04 NOTE — Telephone Encounter (Signed)
Please call pt did not give reasoning

## 2015-06-04 NOTE — Progress Notes (Signed)
Subjective:    Patient ID: Kelly Neal, female    DOB: 11/24/1927, 81 y.o.   MRN: [id number]  HPI Pt returns for f/u of diabetes mellitus: DM type: she is presumed to be evolving type 1 (neg FHx, lean body habitus, and presentation with severe hyperglycemia). Dx'ed: 6222 Complications: renal insufficiency Therapy: insulin since early 2016 GDM: never DKA: never Severe hypoglycemia: never Pancreatitis: never Other: she chose a qd insulin regimen Interval history: Pt get 10 units qam.  dtr brings a record of her cbg's which i have reviewed today.  It varies from 90-136.  It is in general higher as the day goes on.  No recent steroids.   Past Medical History  Diagnosis Date  . Hyperlipidemia   . Essential hypertension   . DM type 2 (diabetes mellitus, type 2)   . Paroxysmal atrial fibrillation     a. Dx 08/2014;  b. CHA2DS2VASc = 6-->Xarelto started 02/2015.  . Tachy-brady syndrome     a. 04/2013 s/p SJM 2210 Accent DC PPM, Ser#: [id number].  Marland Kitchen Chronic combined systolic and diastolic CHF (congestive heart failure)     a. 02/2015 Echo: EF 40%, mod LVH, Ao sclerosis w/o stenosis, triv MR.  . CKD (chronic kidney disease), stage III     Past Surgical History  Procedure Laterality Date  . Cataract extraction    . Permanent pacemaker insertion N/A 04/20/2013    Procedure: PERMANENT PACEMAKER INSERTION;  Surgeon: Evans Lance, MD;  Location: [facility] CATH LAB;  Service: Cardiovascular;  Laterality: N/A;  . Eye surgery      History   Social History  . Marital Status: Married    Spouse Name: N/A  . Number of Children: N/A  . Years of Education: N/A   Occupational History  . Not on file.   Social History Main Topics  . Smoking status: Former Smoker -- 2 years    Types: Cigarettes    Quit date: 11/17/1950  . Smokeless tobacco: Not on file  . Alcohol Use: No  . Drug Use: No  . Sexual Activity: No   Other Topics Concern  . Not on file   Social History Narrative   Diet:    Do  you drink/eat things with caffeine? No   Marital status: Widowed                            What year were you married? 1954   Do you live in a house, apartment, assisted living, condo, trailer, etc)? House   Is it one or more stories? No   How many persons live in your home? 2/3   Do you have any pets in your home? No    Current or past profession: Statistician   Do you exercise?  little                                               Type & how often: daily   Do you have a living will? yes   Do you have a DNR Form? yes   Do you have a POA/HPOA forms? yes    Current Outpatient Prescriptions on File Prior to Visit  Medication Sig Dispense Refill  . acetaminophen (TYLENOL) 325 MG tablet Take 650 mg by mouth every 6 (six) hours as needed for  mild pain.    Marland Kitchen ALPRAZolam (XANAX) 0.25 MG tablet Take 1/2 tablet in AM and at midday as needed for anxiety; take 1 tablet at bedtime for sleep. 60 tablet 1  . Blood Glucose Monitoring Suppl (BAYER CONTOUR NEXT MONITOR) W/DEVICE KIT Use to check blood sugar 2 times per day 1 kit 2  . carvedilol (COREG) 3.125 MG tablet Take 1 tablet (3.125 mg total) by mouth 2 (two) times daily with a meal. 180 tablet 3  . furosemide (LASIX) 20 MG tablet Take 1 tablet (20 mg total) by mouth daily. 90 tablet 3  . Insulin Detemir (LEVEMIR FLEXTOUCH) 100 UNIT/ML Pen Inject 10 Units into the skin every morning. And pen needles 1/day 15 mL 3  . Melatonin 3 MG TABS Take 1 tablet by mouth at bedtime.    Alveda Reasons 15 MG TABS tablet take 1 tablet by mouth once daily 30 tablet 6  . potassium chloride (K-DUR,KLOR-CON) 10 MEQ tablet Take 1 tablet (10 mEq total) by mouth daily. (Patient not taking: Reported on 06/04/2015) 90 tablet 3   No current facility-administered medications on file prior to visit.    No Known Allergies  Family History  Problem Relation Age of Onset  . Hypertension Mother   . Heart attack Father   . Cancer Brother   . Diabetes Neg Hx     BP 130/60 mmHg   Pulse 70  Temp(Src) 98.6 F (37 C) (Oral)  Ht 5' 6.75" (1.695 m)  Wt 136 lb (61.689 kg)  BMI 21.47 kg/m2  SpO2 96%   Review of Systems dtr says pt has not had hypoglycemia.     Objective:   Physical Exam VITAL SIGNS:  See vs page GENERAL: no distress Pulses: dorsalis pedis intact bilat.   MSK: no deformity of the feet CV: trace bilat leg edema Skin:  no ulcer on the feet.  normal color and temp on the feet. Neuro: sensation is intact to touch on the feet Ext: There is bilateral onychomycosis of the toenails   A1c=6.5% Lab Results  Component Value Date   CREATININE 1.39* 03/07/2015   BUN 38* 03/07/2015   NA 136 03/07/2015   K 4.0 03/07/2015   CL 98 03/07/2015   CO2 26 03/07/2015      Assessment & Plan:  DM: overcontrolled, given this regimen, which does match insulin to her changing needs throughout the day. Renal failure: in this setting, she should minimize or avoid sulfonylurea.  Patient is advised the following: Patient Instructions  check your blood sugar twice a day.  vary the time of day when you check, between before the 3 meals, and at bedtime.  also check if you have symptoms of your blood sugar being too high or too low.  please keep a record of the readings and bring it to your next appointment here.  You can write it on any piece of paper.  please call us sooner if your blood sugar goes below 70, or if you have a lot of readings over 200. Please continue the same insulin, and: Reduce the glimepiride to 1 mg daily. Please come back for a follow-up appointment in 3 months.

## 2015-06-04 NOTE — Telephone Encounter (Signed)
Requested call back from the pt to discuss.

## 2015-06-08 ENCOUNTER — Telehealth: Payer: Self-pay | Admitting: Endocrinology

## 2015-06-08 NOTE — Telephone Encounter (Signed)
Pt needs test strips for the contour next test strips called in to pharmacy [facility] please Form should be coming to [place] [facility]

## 2015-06-11 ENCOUNTER — Ambulatory Visit (INDEPENDENT_AMBULATORY_CARE_PROVIDER_SITE_OTHER): Payer: Medicare Other | Admitting: *Deleted

## 2015-06-11 ENCOUNTER — Encounter: Payer: Self-pay | Admitting: Cardiovascular Disease

## 2015-06-11 DIAGNOSIS — I441 Atrioventricular block, second degree: Secondary | ICD-10-CM

## 2015-06-11 DIAGNOSIS — I480 Paroxysmal atrial fibrillation: Secondary | ICD-10-CM

## 2015-06-11 DIAGNOSIS — Z950 Presence of cardiac pacemaker: Secondary | ICD-10-CM | POA: Diagnosis not present

## 2015-06-11 NOTE — Telephone Encounter (Signed)
I contacted the pt. Pt wanted to review reference ranges for Blood sugar and A1C results. I went over the ranges. Pt voiced understanding.

## 2015-06-11 NOTE — Telephone Encounter (Signed)
Pt has a question about something did not tell me what

## 2015-06-13 ENCOUNTER — Encounter: Payer: Self-pay | Admitting: Internal Medicine

## 2015-06-13 ENCOUNTER — Ambulatory Visit (INDEPENDENT_AMBULATORY_CARE_PROVIDER_SITE_OTHER): Payer: Medicare Other | Admitting: Internal Medicine

## 2015-06-13 VITALS — BP 130/64 | HR 65 | Temp 97.6°F | Resp 18 | Ht 67.0 in | Wt 136.2 lb

## 2015-06-13 DIAGNOSIS — E118 Type 2 diabetes mellitus with unspecified complications: Secondary | ICD-10-CM | POA: Diagnosis not present

## 2015-06-13 DIAGNOSIS — I10 Essential (primary) hypertension: Secondary | ICD-10-CM

## 2015-06-13 DIAGNOSIS — E785 Hyperlipidemia, unspecified: Secondary | ICD-10-CM | POA: Diagnosis not present

## 2015-06-13 DIAGNOSIS — R42 Dizziness and giddiness: Secondary | ICD-10-CM

## 2015-06-13 DIAGNOSIS — N184 Chronic kidney disease, stage 4 (severe): Secondary | ICD-10-CM

## 2015-06-13 DIAGNOSIS — I5042 Chronic combined systolic (congestive) and diastolic (congestive) heart failure: Secondary | ICD-10-CM | POA: Diagnosis not present

## 2015-06-13 DIAGNOSIS — Z950 Presence of cardiac pacemaker: Secondary | ICD-10-CM | POA: Diagnosis not present

## 2015-06-13 DIAGNOSIS — I480 Paroxysmal atrial fibrillation: Secondary | ICD-10-CM | POA: Diagnosis not present

## 2015-06-13 DIAGNOSIS — R609 Edema, unspecified: Secondary | ICD-10-CM | POA: Diagnosis not present

## 2015-06-13 DIAGNOSIS — R600 Localized edema: Secondary | ICD-10-CM

## 2015-06-13 NOTE — Progress Notes (Signed)
Patient ID: Kelly Neal, female   DOB: 12-30-1927, 81 y.o.   MRN: [id number]    Location:    [facility]   Place of Service:   OFFICE   Advanced Directive information  DNR (goldenrod scanned into chart)  Chief Complaint  Patient presents with  . Establish Care  . Medical Management of Chronic Issues    HPI:  81 yo female seen today as a new pt. She has DM and BS 110s usually. Occasional low BS reactions (faint sensation, nervousness and diaphoretic). Last A1c 6.5%. amaryl recently reduced to 9m daily. She also takes levemir 10 units daily. She sees Endo Dr ELoanne Drilling She was hospitalized in April for biventricular  CHF (EF 40%) and was sent to SNF for rehab. She completed short term rehab and d/c'd home. She completed home PT. Her daughter lives with her and also her special needs son.  She had pacer placed in 2014 due to tachy-brady syndrome. She takes xeralto for PAF anticoagulation  She has experienced dizziness and flushed sensation since her pacemaker was placed in 2014. She has seen several specialist but no dx made and pacer has been working appropriately. She sees cardio on a regular basis.  She has CKD and is followed by nephro - last labs done in May 2016  Past Medical History  Diagnosis Date  . Hyperlipidemia   . Essential hypertension   . DM type 2 (diabetes mellitus, type 2)   . Paroxysmal atrial fibrillation     a. Dx 08/2014;  b. CHA2DS2VASc = 6-->Xarelto started 02/2015.  . Tachy-brady syndrome     a. 04/2013 s/p SJM 2210 Accent DC PPM, Ser#: [id number]  .Marland KitchenChronic combined systolic and diastolic CHF (congestive heart failure)     a. 02/2015 Echo: EF 40%, mod LVH, Ao sclerosis w/o stenosis, triv MR.  . CKD (chronic kidney disease), stage III     Past Surgical History  Procedure Laterality Date  . Cataract extraction    . Permanent pacemaker insertion N/A 04/20/2013    Procedure: PERMANENT PACEMAKER INSERTION;  Surgeon: GEvans Lance MD;  [facility];  Service:  Cardiovascular;  Laterality: N/A;  . Eye surgery      Patient Care Team: BBarton Fanny MD as PCP - General (Family Medicine) JDonato Heinz MD as Consulting Physician (Nephrology)  History   Social History  . Marital Status: Married    Spouse Name: N/A  . Number of Children: N/A  . Years of Education: N/A   Occupational History  . Not on file.   Social History Main Topics  . Smoking status: Former Smoker -- 2 years    Types: Cigarettes    Quit date: 11/17/1950  . Smokeless tobacco: Not on file  . Alcohol Use: No  . Drug Use: No  . Sexual Activity: No   Other Topics Concern  . Not on file   Social History Narrative   Diet:    Do you drink/eat things with caffeine? No   Marital status: Widowed                            What year were you married? 1954   Do you live in a house, apartment, assisted living, condo, trailer, etc)? House   Is it one or more stories? No   How many persons live in your home? 2/3   Do you have any pets in your home? No  Current or past profession: Statistician   Do you exercise?  little                                               Type & how often: daily   Do you have a living will? yes   Do you have a DNR Form? yes   Do you have a POA/HPOA forms? yes     reports that she quit smoking about 64 years ago. Her smoking use included Cigarettes. She quit after 2 years of use. She does not have any smokeless tobacco history on file. She reports that she does not drink alcohol or use illicit drugs.  Family History  Problem Relation Age of Onset  . Hypertension Mother   . Heart attack Father   . Cancer Brother   . Diabetes Neg Hx    Family Status  Relation Status Death Age  . Mother Deceased 31  . Father Deceased   . Brother Deceased   . Daughter Alive   . Son Alive   . Brother Alive   . Son Alive     Immunization History  Administered Date(s) Administered  . PPD Test 03/07/2015    No Known  Allergies  Medications: Patient's Medications  New Prescriptions   No medications on file  Previous Medications   ACETAMINOPHEN (TYLENOL) 325 MG TABLET    Take 650 mg by mouth every 6 (six) hours as needed for mild pain.   ALPRAZOLAM (XANAX) 0.25 MG TABLET    Take 1/2 tablet in AM and at midday as needed for anxiety; take 1 tablet at bedtime for sleep.   B-D ULTRAFINE III SHORT PEN 31G X 8 MM MISC    See admin instructions.   BAYER MICROLET LANCETS LANCETS    2 (two) times daily. for testing   BLOOD GLUCOSE MONITORING SUPPL (BAYER CONTOUR NEXT MONITOR) W/DEVICE KIT    Use to check blood sugar 2 times per day   CARVEDILOL (COREG) 3.125 MG TABLET    Take 1 tablet (3.125 mg total) by mouth 2 (two) times daily with a meal.   FUROSEMIDE (LASIX) 20 MG TABLET    Take 1 tablet (20 mg total) by mouth daily.   GLIMEPIRIDE (AMARYL) 1 MG TABLET    Take 1 tablet (1 mg total) by mouth daily with breakfast.   GLUCOSE BLOOD (BAYER CONTOUR NEXT TEST) TEST STRIP    1 each by Other route 2 (two) times daily. And lancets 2/day   INSULIN DETEMIR (LEVEMIR FLEXTOUCH) 100 UNIT/ML PEN    Inject 10 Units into the skin every morning. And pen needles 1/day   MELATONIN 3 MG TABS    Take 1 tablet by mouth at bedtime.   XARELTO 15 MG TABS TABLET    take 1 tablet by mouth once daily  Modified Medications   No medications on file  Discontinued Medications   POTASSIUM CHLORIDE (K-DUR,KLOR-CON) 10 MEQ TABLET    Take 1 tablet (10 mEq total) by mouth daily.    Review of Systems  Constitutional: Positive for fatigue. Negative for fever, chills, diaphoresis, activity change and appetite change.  HENT: Negative for ear pain and sore throat.        Loss of smell  Eyes: Positive for visual disturbance (cataracts).  Respiratory: Negative for cough, chest tightness and shortness of breath.   Cardiovascular:  Negative for chest pain, palpitations and leg swelling.  Gastrointestinal: Negative for nausea, vomiting, abdominal pain,  diarrhea, constipation and blood in stool.  Genitourinary: Negative for dysuria.  Musculoskeletal: Negative for arthralgias.  Skin:       Dry skin  Neurological: Positive for weakness. Negative for dizziness, tremors, numbness and headaches.  Psychiatric/Behavioral: Negative for sleep disturbance. The patient is not nervous/anxious.     Filed Vitals:   06/13/15 1331  BP: 130/64  Pulse: 65  Temp: 97.6 F (36.4 C)  TempSrc: Oral  Resp: 18  Height: 5' 7"  (1.702 m)  Weight: 136 lb 3.2 oz (61.78 kg)  SpO2: 95%   Body mass index is 21.33 kg/(m^2).  Physical Exam  Constitutional: She is oriented to person, place, and time. She appears well-developed and well-nourished. No distress.  HENT:  Mouth/Throat: Oropharynx is clear and moist. No oropharyngeal exudate.  Eyes: Pupils are equal, round, and reactive to light. No scleral icterus.  Neck: Neck supple. Carotid bruit is not present. No tracheal deviation present. No thyromegaly present.  Cardiovascular: Normal rate, regular rhythm, normal heart sounds and intact distal pulses.  Exam reveals no gallop and no friction rub.   No murmur heard. No LE edema b/l. no calf TTP.   Pulmonary/Chest: Effort normal and breath sounds normal. No stridor. No respiratory distress. She has no wheezes. She has no rales.  Intact palpable NT left ACW pacer  Abdominal: Soft. Bowel sounds are normal. She exhibits no distension and no mass. There is no hepatomegaly. There is no tenderness. There is no rebound and no guarding.  Musculoskeletal: She exhibits edema.  Lymphadenopathy:    She has no cervical adenopathy.  Neurological: She is alert and oriented to person, place, and time. She has normal reflexes.  Skin: Skin is warm and dry. No rash noted.  Psychiatric: She has a normal mood and affect. Her behavior is normal. Thought content normal.     Labs reviewed: Office Visit on 06/04/2015  Component Date Value Ref Range Status  . Hemoglobin A1C  06/04/2015 6.5   Final   CMP Latest Ref Rng 03/07/2015 03/06/2015 03/05/2015  Glucose 70 - 99 mg/dL 169(H) 290(H) 225(H)  BUN 6 - 23 mg/dL 38(H) 34(H) 32(H)  Creatinine 0.50 - 1.10 mg/dL 1.39(H) 1.54(H) 1.81(H)  Sodium 135 - 145 mmol/L 136 131(L) 134(L)  Potassium 3.5 - 5.1 mmol/L 4.0 3.9 3.7  Chloride 96 - 112 mmol/L 98 94(L) 94(L)  CO2 19 - 32 mmol/L 26 28 28   Calcium 8.4 - 10.5 mg/dL 8.7 8.7 8.5  Total Protein 6.0 - 8.3 g/dL - - -  Total Bilirubin 0.2 - 1.2 mg/dL - - -  Alkaline Phos 39 - 117 U/L - - -  AST 0 - 37 U/L - - -  ALT 0 - 35 U/L - - -    CBC Latest Ref Rng 03/04/2015 03/03/2015 03/03/2015  WBC 4.0 - 10.5 K/uL 7.5 7.9 7.0  Hemoglobin 12.0 - 15.0 g/dL 14.5 13.9 12.9  Hematocrit 36.0 - 46.0 % 41.7 39.7 37.2  Platelets 150 - 400 K/uL 220 231 263      No results found. EXAM: CHEST 2 VIEW  COMPARISON: 04/21/2013  FINDINGS: Cardiac silhouette is mildly enlarged. There is a well-positioned left anterior chest wall sequential pacemaker, stable. No mediastinal or hilar masses or evidence of adenopathy.  Lungs are mildly hyperexpanded. Lungs are clear. No pleural effusion or pneumothorax.  Bony thorax is demineralized but intact.  IMPRESSION: No acute cardiopulmonary disease.  Electronically Signed  By: Lajean Manes M.D.  On: 03/03/2015 12:57  Assessment/Plan   ICD-9-CM ICD-10-CM   1. Dizziness - etiology unknown but possibly related to #9 although CXR in 02/2015 revealed it was well positioned 780.4 R42   2. Chronic combined systolic and diastolic CHF (congestive heart failure) - stable 428.42 I50.42    428.0    3. Paroxysmal atrial fibrillation - rate controlled 427.31 I48.0   4. Essential hypertension - stable 401.9 I10   5. DM (diabetes mellitus), type 2 with complications - controlled 250.90 E11.8   6. Hyperlipidemia - stable 272.4 E78.5   7. CKD (chronic kidney disease) stage 4, GFR 15-29 ml/min - stable 585.4 N18.4   8. Peripheral edema -  stable 782.3 R60.9   9. Cardiac pacemaker in situ-[employer] implanted 04/20/13 V45.01 Z95.0     --she may need to d/w cardio about upgrading her pacemaker  --continue current meds as ordered  --keep appt with specialists as scheduled   --she refuses Prevnar, pneumovax, zostavax and Tdap vaccines  --f/u in 3 mos for medicare CPE/MMSE  Ashleyann Shoun S. Perlie Gold  [facility] [address] [place], [place] [address] [phone] Cell (Monday-Friday 8 AM - 5 PM) [phone] After 5 PM and follow prompts

## 2015-06-13 NOTE — Patient Instructions (Signed)
Continue current medications as ordered  Keep appointment with specialists as scheduled  Follow up in 3 mos for CPE

## 2015-06-18 ENCOUNTER — Other Ambulatory Visit: Payer: Self-pay | Admitting: *Deleted

## 2015-06-19 LAB — CUP PACEART REMOTE DEVICE CHECK
Brady Statistic AP VP Percent: 12 %
Brady Statistic AP VS Percent: 1 %
Brady Statistic AS VP Percent: 87 %
Brady Statistic RA Percent Paced: 12 %
Brady Statistic RV Percent Paced: 99 %
Lead Channel Pacing Threshold Amplitude: 0.625 V
Lead Channel Pacing Threshold Pulse Width: 0.4 ms
Lead Channel Setting Pacing Amplitude: 2 V
Lead Channel Setting Sensing Sensitivity: 2 mV
MDC IDC MSMT BATTERY REMAINING LONGEVITY: 141 mo
MDC IDC MSMT BATTERY REMAINING PERCENTAGE: 95.5 %
MDC IDC MSMT BATTERY VOLTAGE: 3.01 V
MDC IDC MSMT LEADCHNL RA IMPEDANCE VALUE: 440 Ohm
MDC IDC MSMT LEADCHNL RA PACING THRESHOLD AMPLITUDE: 0.75 V
MDC IDC MSMT LEADCHNL RA SENSING INTR AMPL: 1.7 mV
MDC IDC MSMT LEADCHNL RV IMPEDANCE VALUE: 490 Ohm
MDC IDC MSMT LEADCHNL RV PACING THRESHOLD PULSEWIDTH: 0.4 ms
MDC IDC MSMT LEADCHNL RV SENSING INTR AMPL: 11.7 mV
MDC IDC SESS DTM: 20160725074814
MDC IDC SET LEADCHNL RV PACING AMPLITUDE: 0.875
MDC IDC SET LEADCHNL RV PACING PULSEWIDTH: 0.4 ms
MDC IDC STAT BRADY AS VS PERCENT: 1 %
Pulse Gen Serial Number: 7458795

## 2015-06-19 NOTE — Progress Notes (Signed)
Pacemaker remote check. Device function reviewed. Impedance, sensing, auto capture thresholds consistent with previous measurements. Histograms appropriate for patient and level of activity. All other diagnostic data reviewed and is appropriate and stable for patient. Real time/magnet EGM shows appropriate sensing and capture. No ventricular high rate episodes. 1 mode switch episode - FFRW. Estimated longevity 11.3 years. Note in [employer] that patient is no longer able to come into office. Will schedule remote in 3 months.

## 2015-06-27 ENCOUNTER — Encounter: Payer: Self-pay | Admitting: Cardiology

## 2015-07-09 ENCOUNTER — Telehealth: Payer: Self-pay | Admitting: Endocrinology

## 2015-07-09 NOTE — Telephone Encounter (Signed)
Pt also needs the pens

## 2015-07-09 NOTE — Telephone Encounter (Signed)
Is the pt requesting a refill on her pen needles and the Levemir flex touch pen?

## 2015-07-09 NOTE — Telephone Encounter (Signed)
Pt needs short pen needles [facility] 90 day supply

## 2015-07-11 ENCOUNTER — Encounter: Payer: Self-pay | Admitting: Cardiology

## 2015-08-14 ENCOUNTER — Telehealth: Payer: Self-pay | Admitting: Endocrinology

## 2015-08-14 MED ORDER — BD PEN NEEDLE SHORT U/F 31G X 8 MM MISC: Status: AC

## 2015-08-14 NOTE — Telephone Encounter (Signed)
Rx has been submitted per pt's request to [facility].

## 2015-08-14 NOTE — Telephone Encounter (Signed)
Patient need refill of B-D ULTRAFINE III SHORT PEN 31G X 8 MM MISC,  [facility] [place] [place] [phone] (Phone) [phone] (Fax)

## 2015-09-04 ENCOUNTER — Ambulatory Visit: Payer: Medicare Other | Admitting: Endocrinology

## 2015-09-14 ENCOUNTER — Encounter: Payer: Medicare Other | Admitting: Internal Medicine

## 2015-09-18 ENCOUNTER — Ambulatory Visit (INDEPENDENT_AMBULATORY_CARE_PROVIDER_SITE_OTHER): Payer: Medicare Other | Admitting: *Deleted

## 2015-09-18 ENCOUNTER — Telehealth: Payer: Self-pay | Admitting: Cardiovascular Disease

## 2015-09-18 ENCOUNTER — Telehealth: Payer: Self-pay | Admitting: Cardiology

## 2015-09-18 DIAGNOSIS — I495 Sick sinus syndrome: Secondary | ICD-10-CM

## 2015-09-18 NOTE — Telephone Encounter (Signed)
Spoke with pt and reminded pt of remote transmission that is due today. Pt verbalized understanding.   

## 2015-09-18 NOTE — Telephone Encounter (Signed)
New Message  2. Is you device beeping? yes  Pt called states that the device is beeping. Sending transmission was told that it would take 10 minutes. It has now continued to beep for over 40 minutes. Please call

## 2015-09-18 NOTE — Telephone Encounter (Signed)
Spoke w/ pt daughter and informed her to call tech services. Pt daughter verbalized understanding.

## 2015-09-19 NOTE — Progress Notes (Signed)
Remote pacemaker transmission.   

## 2015-10-04 ENCOUNTER — Encounter: Payer: Self-pay | Admitting: Cardiology

## 2015-10-04 LAB — CUP PACEART REMOTE DEVICE CHECK
Battery Remaining Longevity: 139 mo
Battery Remaining Percentage: 95.5 %
Battery Voltage: 3.01 V
Brady Statistic AP VS Percent: 1 %
Brady Statistic AS VS Percent: 1 %
Implantable Lead Implant Date: 20140604
Implantable Lead Location: 753859
Lead Channel Impedance Value: 410 Ohm
Lead Channel Pacing Threshold Amplitude: 0.75 V
Lead Channel Pacing Threshold Amplitude: 0.875 V
Lead Channel Pacing Threshold Pulse Width: 0.4 ms
Lead Channel Sensing Intrinsic Amplitude: 1.4 mV
Lead Channel Setting Pacing Pulse Width: 0.4 ms
Lead Channel Setting Sensing Sensitivity: 2 mV
MDC IDC LEAD IMPLANT DT: 20140604
MDC IDC LEAD LOCATION: 753860
MDC IDC MSMT LEADCHNL RA PACING THRESHOLD PULSEWIDTH: 0.4 ms
MDC IDC MSMT LEADCHNL RV IMPEDANCE VALUE: 510 Ohm
MDC IDC MSMT LEADCHNL RV SENSING INTR AMPL: 11.7 mV
MDC IDC SESS DTM: 20161102062322
MDC IDC SET LEADCHNL RA PACING AMPLITUDE: 2 V
MDC IDC SET LEADCHNL RV PACING AMPLITUDE: 1.125
MDC IDC STAT BRADY AP VP PERCENT: 16 %
MDC IDC STAT BRADY AS VP PERCENT: 84 %
MDC IDC STAT BRADY RA PERCENT PACED: 16 %
MDC IDC STAT BRADY RV PERCENT PACED: 99 %
Pulse Gen Serial Number: 7458795

## 2015-10-05 ENCOUNTER — Encounter: Payer: Self-pay | Admitting: Cardiovascular Disease

## 2015-10-15 ENCOUNTER — Telehealth: Payer: Self-pay | Admitting: Cardiovascular Disease

## 2015-10-15 NOTE — Telephone Encounter (Signed)
New Message  Pt calling to speak w/ Device- pt has been trying w/ Tech support to send remote transmission and want to know what to do going forward. Please call back and discuss.

## 2015-10-15 NOTE — Telephone Encounter (Signed)
Informed pt that her transmission was received. She verbalized understanding.  

## 2015-10-16 ENCOUNTER — Encounter: Payer: Self-pay | Admitting: Cardiology

## 2015-10-18 ENCOUNTER — Encounter: Payer: Self-pay | Admitting: Cardiology

## 2015-12-11 ENCOUNTER — Encounter: Payer: Self-pay | Admitting: Cardiovascular Disease

## 2015-12-11 ENCOUNTER — Ambulatory Visit (INDEPENDENT_AMBULATORY_CARE_PROVIDER_SITE_OTHER): Payer: Medicare Other | Admitting: Cardiovascular Disease

## 2015-12-11 VITALS — BP 140/71 | HR 70 | Resp 16 | Ht 67.5 in | Wt 139.3 lb

## 2015-12-11 DIAGNOSIS — I480 Paroxysmal atrial fibrillation: Secondary | ICD-10-CM | POA: Diagnosis not present

## 2015-12-11 DIAGNOSIS — Z950 Presence of cardiac pacemaker: Secondary | ICD-10-CM | POA: Diagnosis not present

## 2015-12-11 DIAGNOSIS — I10 Essential (primary) hypertension: Secondary | ICD-10-CM

## 2015-12-11 DIAGNOSIS — Z7901 Long term (current) use of anticoagulants: Secondary | ICD-10-CM

## 2015-12-11 DIAGNOSIS — N184 Chronic kidney disease, stage 4 (severe): Secondary | ICD-10-CM

## 2015-12-11 DIAGNOSIS — I5042 Chronic combined systolic (congestive) and diastolic (congestive) heart failure: Secondary | ICD-10-CM

## 2015-12-11 DIAGNOSIS — I441 Atrioventricular block, second degree: Secondary | ICD-10-CM | POA: Diagnosis not present

## 2015-12-11 DIAGNOSIS — E785 Hyperlipidemia, unspecified: Secondary | ICD-10-CM

## 2015-12-11 LAB — CUP PACEART INCLINIC DEVICE CHECK
Implantable Lead Implant Date: 20140604
Implantable Lead Location: 753859
Implantable Lead Location: 753860
Lead Channel Setting Pacing Amplitude: 1.125
Lead Channel Setting Pacing Amplitude: 2 V
MDC IDC LEAD IMPLANT DT: 20140604
MDC IDC PG SERIAL: 7458795
MDC IDC SESS DTM: 20170228102109
MDC IDC SET LEADCHNL RV PACING PULSEWIDTH: 0.4 ms
MDC IDC SET LEADCHNL RV SENSING SENSITIVITY: 2 mV
Pulse Gen Model: 2210

## 2015-12-11 MED ORDER — RIVAROXABAN 15 MG PO TABS: 15.0000 mg | ORAL_TABLET | Freq: Every day | ORAL | Status: DC

## 2015-12-11 NOTE — Progress Notes (Signed)
Patient ID: Kelly Neal, female   DOB: 09/15/1928, 82 y.o.   MRN: [id number]    Cardiology Office Note    Date:  12/13/2015   ID:  TRISTAN BRAMBLE, DOB 10-03-1928, MRN [id number]  PCP:  Ellsworth Lennox, MD  Cardiologist:   Sanda Klein, MD   Chief Complaint  Patient presents with  . Pacemaker Check    History of Present Illness:  Kelly Neal is a 82 y.o. female with combined systolic and diastolic heart failure (most recent ejection fraction 40% by echo April 2016), second-degree AV block Mobitz type I s/p dual chamber permanent pacemaker implantation in 2014 (St. Jude Accent RF), excisional atrial fibrillation, hyperlipidemia, hypertension, type 2 diabetes mellitus, peripheral neuropathy. He returns for a pacemaker follow-up and evaluation of treatment for congestive heart failure. She has a history of orthostatic hypotension and falls. She is on little in the way of treatment for CHF. She has now been on anticoagulation with Xarelto since April 2016 and has not had any serious bleeding problems. Thankfully she has not had any significant falls either.  Her most important complaint is a constant and persistent sensation of dizziness. Sometimes it seems that she has true vertigo: the room spins.  For the most part, it is evident that her symptoms worsen when she stands up. She also describes a sensation of persistent fullness in her head, facial tingling, sensation of "roaring in her head". She describes all these symptoms as being temporally associated with implantation of the pacemaker.  They discussed upgrade to CRT-P with Dr. Murtis Sink at [facility].  Interrogation of her pacemaker shows normal device function. She has 13% atrial pacing and virtually 100% ventricular pacing. No significant episodes of tachycardia arrhythmia have been recorded. Estimated generator longevity is 11-12 years. Normal and stable lead parameters.  When her pacemaker was implanted she had normal left  ventricular systolic and diastolic function and no history of CHF. She was then hospitalized in April 2016 for heart failure.   Past Medical History  Diagnosis Date  . Hyperlipidemia   . Essential hypertension   . DM type 2 (diabetes mellitus, type 2) ([place])   . Paroxysmal atrial fibrillation ([place])     a. Dx 08/2014;  b. CHA2DS2VASc = 6-->Xarelto started 02/2015.  . Tachy-brady syndrome ([place])     a. 04/2013 s/p SJM 2210 Accent DC PPM, Ser#: [id number].  Marland Kitchen Chronic combined systolic and diastolic CHF (congestive heart failure) ([place])     a. 02/2015 Echo: EF 40%, mod LVH, Ao sclerosis w/o stenosis, triv MR.  . CKD (chronic kidney disease), stage III     Past Surgical History  Procedure Laterality Date  . Cataract extraction    . Permanent pacemaker insertion N/A 04/20/2013    Procedure: PERMANENT PACEMAKER INSERTION;  Surgeon: Evans Lance, MD;  Location: [facility] CATH LAB;  Service: Cardiovascular;  Laterality: N/A;  . Eye surgery      Outpatient Prescriptions Prior to Visit  Medication Sig Dispense Refill  . acetaminophen (TYLENOL) 325 MG tablet Take 650 mg by mouth every 6 (six) hours as needed for mild pain.    Marland Kitchen ALPRAZolam (XANAX) 0.25 MG tablet Take 1/2 tablet in AM and at midday as needed for anxiety; take 1 tablet at bedtime for sleep. 60 tablet 1  . B-D ULTRAFINE III SHORT PEN 31G X 8 MM MISC Use to inject insulin 1 time per day 100 each 2  . BAYER MICROLET LANCETS lancets 2 (two) times daily. for testing  0  .  Blood Glucose Monitoring Suppl (BAYER CONTOUR NEXT MONITOR) W/DEVICE KIT Use to check blood sugar 2 times per day 1 kit 2  . carvedilol (COREG) 3.125 MG tablet Take 1 tablet (3.125 mg total) by mouth 2 (two) times daily with a meal. 180 tablet 3  . furosemide (LASIX) 20 MG tablet Take 1 tablet (20 mg total) by mouth daily. 90 tablet 3  . glimepiride (AMARYL) 1 MG tablet Take 1 tablet (1 mg total) by mouth daily with breakfast. 90 tablet 3  . glucose blood (BAYER CONTOUR NEXT TEST)  test strip 1 each by Other route 2 (two) times daily. And lancets 2/day 100 each 1  . Insulin Detemir (LEVEMIR FLEXTOUCH) 100 UNIT/ML Pen Inject 10 Units into the skin every morning. And pen needles 1/day 15 mL 3  . Melatonin 3 MG TABS Take 1 tablet by mouth at bedtime.    Alveda Reasons 15 MG TABS tablet take 1 tablet by mouth once daily 30 tablet 6   No facility-administered medications prior to visit.     Allergies:   Review of patient's allergies indicates no known allergies.   Social History   Social History  . Marital Status: Married    Spouse Name: N/A  . Number of Children: N/A  . Years of Education: N/A   Social History Main Topics  . Smoking status: Former Smoker -- 2 years    Types: Cigarettes    Quit date: 11/17/1950  . Smokeless tobacco: None  . Alcohol Use: No  . Drug Use: No  . Sexual Activity: No   Other Topics Concern  . None   Social History Narrative   Diet:    Do you drink/eat things with caffeine? No   Marital status: Widowed                            What year were you married? 1954   Do you live in a house, apartment, assisted living, condo, trailer, etc)? House   Is it one or more stories? No   How many persons live in your home? 2/3   Do you have any pets in your home? No    Current or past profession: Statistician   Do you exercise?  little                                               Type & how often: daily   Do you have a living will? yes   Do you have a DNR Form? yes   Do you have a POA/HPOA forms? yes     Family History:  The patient's family history includes Cancer in her brother; Heart attack in her father; Hypertension in her mother. There is no history of Diabetes.   ROS:   Please see the history of present illness.    ROS All other systems reviewed and are negative.   PHYSICAL EXAM:   VS:  BP 140/71 mmHg  Pulse 70  Resp 16  Ht 5' 7.5" (1.715 m)  Wt 139 lb 4.8 oz (63.186 kg)  BMI 21.48 kg/m2  blood pressure was 150/90 laying  down, decreased to 140/70 sitting up  GEN: Well nourished, well developed, in no acute distress HEENT: normal Neck: no JVD, carotid bruits, or masses Cardiac: RRR; no murmurs, rubs, or gallops,no  edema , healthy pacemaker site Respiratory:  clear to auscultation bilaterally, normal work of breathing GI: soft, nontender, nondistended, + BS MS: no deformity or atrophy Skin: warm and dry, no rash Neuro:  Alert and Oriented x 3, Strength and sensation are intact Psych: euthymic mood, full affect  Wt Readings from Last 3 Encounters:  12/11/15 139 lb 4.8 oz (63.186 kg)  06/13/15 136 lb 3.2 oz (61.78 kg)  06/04/15 136 lb (61.689 kg)      Studies/Labs Reviewed:   EKG:  EKG is ordered today.  The ekg ordered today demonstrates atrial sensed, ventricular paced rhythm  Recent Labs: 03/03/2015: B Natriuretic Peptide 544.4* 03/04/2015: TSH 2.522 04/06/2015: ALT 17; BUN 39*; Creatinine 1.2*; Hemoglobin 12.4; Platelets 266; Potassium 5.3; Sodium 137   Lipid Panel    Component Value Date/Time   CHOL 208* 03/07/2015 0336   TRIG 160* 03/07/2015 0336   HDL 45 03/07/2015 0336   CHOLHDL 4.6 03/07/2015 0336   VLDL 32 03/07/2015 0336   LDLCALC 131* 03/07/2015 0336   LDLDIRECT 115* 01/06/2013 1236     ASSESSMENT:    1. PAF (paroxysmal atrial fibrillation) ([place])   2. Chronic combined systolic and diastolic CHF (congestive heart failure) ([place])   3. Second degree atrioventricular block, Mobitz (type) I   4. Cardiac pacemaker in situ-St Jude implanted 04/20/13   5. Essential hypertension   6. CKD (chronic kidney disease) stage 4, GFR 15-29 ml/min ([facility])   7. Hyperlipidemia   8. Long term (current) use of anticoagulants      PLAN:  In order of problems listed above:  1. PAFib: No recent episodes recorded by her device. High embolic risk (CHADSVasc 6: age 15, gender, HTN, DM, CHF).  2. CHF: Appears to be euvolemic today, NYHA functional class II. EF has likely decreased due to pacing induced  dyssynchrony. At this point, does not seem to need upgrade of her device to a CRT pacemaker, but this is an option in the future. Discussed with the patient and her daughter that upgrade to CRTP might help with symptoms of dyspnea, but is unlikely to help her dizziness and the other "head-related" complaints which represents her major problem 3. 2nd Degree AV block: Unable to adjust programming to avoid high prevalence of ventricular pacing 4. PPM: Normal device function, remote download via Merlin every 3 months 5. HTN: Avoid excessive treatment of her blood pressure due to tendency to orthostatic hypotension in the setting of peripheral neuropathy 6. CKD stage 3-4: Most recent creatinine shows improvement. Medication doses adjusted for renal function. 24 hour urine collection showed significant proteinuria at greater than 3 g/day. Her furosemide has been stopped in the past due to worsening renal function. 7. HLP: in the setting of DM, no known vascular disease, target LDL<100, but reluctant to start statin. 8. Thankfully no recent falls and no serious bleeding on treatment with Xarelto adjusted for renal function     Medication Adjustments/Labs and Tests Ordered: Current medicines are reviewed at length with the patient today.  Concerns regarding medicines are outlined above.  Medication changes, Labs and Tests ordered today are listed in the Patient Instructions below. Patient Instructions  Remote monitoring is used to monitor your Pacemaker from home. This monitoring reduces the number of office visits required to check your device to one time per year. It allows [place] to monitor the functioning of your device to ensure it is working properly. You are scheduled for a device check from home on March 12, 2016. You  may send your transmission at any time that day. If you have a wireless device, the transmission will be sent automatically. After your physician reviews your transmission, you will receive a  postcard with your next transmission date.  Dr. Sallyanne Kuster recommends that you schedule a follow-up appointment in: [place], Lola Lofaro, MD  12/13/2015 1:59 PM    [facility] [place], [place], [place]  [address] Phone: [phone]; Fax: [phone]

## 2015-12-11 NOTE — Patient Instructions (Signed)
Remote monitoring is used to monitor your Pacemaker from home. This monitoring reduces the number of office visits required to check your device to one time per year. It allows [place] to monitor the functioning of your device to ensure it is working properly. You are scheduled for a device check from home on March 12, 2016. You may send your transmission at any time that day. If you have a wireless device, the transmission will be sent automatically. After your physician reviews your transmission, you will receive a postcard with your next transmission date.  Dr. Sallyanne Kuster recommends that you schedule a follow-up appointment in: 6 MONTHS

## 2015-12-13 ENCOUNTER — Encounter: Payer: Self-pay | Admitting: Cardiovascular Disease

## 2015-12-13 DIAGNOSIS — Z7901 Long term (current) use of anticoagulants: Secondary | ICD-10-CM | POA: Insufficient documentation

## 2015-12-18 ENCOUNTER — Telehealth: Payer: Self-pay | Admitting: Cardiovascular Disease

## 2015-12-18 DIAGNOSIS — R6889 Other general symptoms and signs: Secondary | ICD-10-CM

## 2015-12-18 NOTE — Telephone Encounter (Signed)
REVIEWED LAST  OFFICE  NOTE  NOTHING  WAS  MENTIONED  RE REFERRAL  WILL FORWARD TO   DR  Sallyanne Kuster FOR  REVIEW .Adonis Housekeeper

## 2015-12-18 NOTE — Telephone Encounter (Signed)
I recommended ENT evaluation, but did not actually plan a referral. Would be glad to do so if they desire.  Please refer to Dr. Patsy [place]

## 2015-12-18 NOTE — Telephone Encounter (Signed)
NeW Message  Pt dtr calling to speak w/ RN- stated that at last OV- Dr C was referring pt to ENT doctor. Pt dtr isfollowing up on referral. Please call back and discuss.

## 2015-12-18 NOTE — Telephone Encounter (Signed)
APPT MADE DAUGHTER AWARE .Adonis Housekeeper

## 2015-12-18 NOTE — Telephone Encounter (Signed)
PT'S DAUGHTER  AWARE  [place]  WILL CALL  AND  MAKE  ARRANGEMENTS  FOR  AN APPT  WITH DR  JEF  ROSEN .Adonis Housekeeper

## 2016-01-08 ENCOUNTER — Telehealth: Payer: Self-pay | Admitting: Endocrinology

## 2016-01-08 NOTE — Telephone Encounter (Signed)
Pt called requesting to speak with Megan.

## 2016-01-08 NOTE — Telephone Encounter (Signed)
Ov is due.  Let's address then 

## 2016-01-08 NOTE — Telephone Encounter (Signed)
Pt called stating is was concerned about her fasting blood sugar reading this morning. Pt stated the reading was 69 and pt stated her range has been between 75 and 150. Please advised if any medication changes should be made. Thanks!

## 2016-01-14 NOTE — Telephone Encounter (Signed)
I contacted the pt and advised of note below. Pt scheduled for 01/16/2016.

## 2016-01-16 ENCOUNTER — Encounter: Payer: Self-pay | Admitting: Endocrinology

## 2016-01-16 ENCOUNTER — Ambulatory Visit (INDEPENDENT_AMBULATORY_CARE_PROVIDER_SITE_OTHER): Payer: Medicare Other | Admitting: Endocrinology

## 2016-01-16 ENCOUNTER — Other Ambulatory Visit: Payer: Self-pay

## 2016-01-16 VITALS — BP 128/86 | HR 73 | Temp 98.0°F | Ht 67.5 in | Wt 142.0 lb

## 2016-01-16 DIAGNOSIS — Z794 Long term (current) use of insulin: Secondary | ICD-10-CM

## 2016-01-16 DIAGNOSIS — E1122 Type 2 diabetes mellitus with diabetic chronic kidney disease: Secondary | ICD-10-CM

## 2016-01-16 DIAGNOSIS — N183 Chronic kidney disease, stage 3 (moderate): Secondary | ICD-10-CM | POA: Diagnosis not present

## 2016-01-16 DIAGNOSIS — E118 Type 2 diabetes mellitus with unspecified complications: Secondary | ICD-10-CM

## 2016-01-16 LAB — POCT GLYCOSYLATED HEMOGLOBIN (HGB A1C): HEMOGLOBIN A1C: 6.4

## 2016-01-16 MED ORDER — GLUCOSE BLOOD VI STRP: 1.0000 | ORAL_STRIP | Freq: Two times a day (BID) | Status: DC

## 2016-01-16 MED ORDER — GLIMEPIRIDE 1 MG PO TABS: 0.5000 mg | ORAL_TABLET | Freq: Every day | ORAL | Status: DC

## 2016-01-16 NOTE — Patient Instructions (Addendum)
check your blood sugar twice a day.  vary the time of day when you check, between before the 3 meals, and at bedtime.  also check if you have symptoms of your blood sugar being too high or too low.  please keep a record of the readings and bring it to your next appointment here.  You can write it on any piece of paper.  please call us sooner if your blood sugar goes below 70, or if you have a lot of readings over 200. Please continue the same insulin, and: Reduce the glimepiride to 1/2 pill mg daily.  Please come back for a follow-up appointment in 2 months.

## 2016-01-16 NOTE — Progress Notes (Signed)
Subjective:    Patient ID: Kelly Neal, female    DOB: 12-10-1927, 82 y.o.   MRN: [id number]  HPI Pt returns for f/u of diabetes mellitus: DM type: she is presumed to be evolving type 1 (neg FHx, lean body habitus, and presentation with severe hyperglycemia). Dx'ed: 6948 Complications: renal insufficiency.  Therapy: insulin since early 2016 GDM: never DKA: never Severe hypoglycemia: never Pancreatitis: never Other: she chose a qd insulin regimen Interval history: Pt gets 10 units qam.  dtr brings a record of her cbg's which i have reviewed today.  It varies from 69-150.  pt states she feels well in general, except for lightheadedness. Past Medical History  Diagnosis Date  . Hyperlipidemia   . Essential hypertension   . DM type 2 (diabetes mellitus, type 2) ([place])   . Paroxysmal atrial fibrillation ([place])     a. Dx 08/2014;  b. CHA2DS2VASc = 6-->Xarelto started 02/2015.  . Tachy-brady syndrome ([place])     a. 04/2013 s/p SJM 2210 Accent DC PPM, Ser#: [id number].  Marland Kitchen Chronic combined systolic and diastolic CHF (congestive heart failure) ([place])     a. 02/2015 Echo: EF 40%, mod LVH, Ao sclerosis w/o stenosis, triv MR.  . CKD (chronic kidney disease), stage III     Past Surgical History  Procedure Laterality Date  . Cataract extraction    . Permanent pacemaker insertion N/A 04/20/2013    Procedure: PERMANENT PACEMAKER INSERTION;  Surgeon: Evans Lance, MD;  Location: [facility] CATH LAB;  Service: Cardiovascular;  Laterality: N/A;  . Eye surgery      Social History   Social History  . Marital Status: Married    Spouse Name: N/A  . Number of Children: N/A  . Years of Education: N/A   Occupational History  . Not on file.   Social History Main Topics  . Smoking status: Former Smoker -- 2 years    Types: Cigarettes    Quit date: 11/17/1950  . Smokeless tobacco: Not on file  . Alcohol Use: No  . Drug Use: No  . Sexual Activity: No   Other Topics Concern  . Not on file   Social  History Narrative   Diet:    Do you drink/eat things with caffeine? No   Marital status: Widowed                            What year were you married? 1954   Do you live in a house, apartment, assisted living, condo, trailer, etc)? House   Is it one or more stories? No   How many persons live in your home? 2/3   Do you have any pets in your home? No    Current or past profession: Statistician   Do you exercise?  little                                               Type & how often: daily   Do you have a living will? yes   Do you have a DNR Form? yes   Do you have a POA/HPOA forms? yes    Current Outpatient Prescriptions on File Prior to Visit  Medication Sig Dispense Refill  . acetaminophen (TYLENOL) 325 MG tablet Take 650 mg by mouth every 6 (six) hours as needed for  mild pain.    Marland Kitchen ALPRAZolam (XANAX) 0.25 MG tablet Take 1/2 tablet in AM and at midday as needed for anxiety; take 1 tablet at bedtime for sleep. 60 tablet 1  . B-D ULTRAFINE III SHORT PEN 31G X 8 MM MISC Use to inject insulin 1 time per day 100 each 2  . BAYER MICROLET LANCETS lancets 2 (two) times daily. for testing  0  . Blood Glucose Monitoring Suppl (BAYER CONTOUR NEXT MONITOR) W/DEVICE KIT Use to check blood sugar 2 times per day 1 kit 2  . carvedilol (COREG) 3.125 MG tablet Take 1 tablet (3.125 mg total) by mouth 2 (two) times daily with a meal. 180 tablet 3  . furosemide (LASIX) 20 MG tablet Take 1 tablet (20 mg total) by mouth daily. 90 tablet 3  . Insulin Detemir (LEVEMIR FLEXTOUCH) 100 UNIT/ML Pen Inject 10 Units into the skin every morning. And pen needles 1/day 15 mL 3  . Melatonin 3 MG TABS Take 1 tablet by mouth at bedtime.    . Rivaroxaban (XARELTO) 15 MG TABS tablet Take 1 tablet (15 mg total) by mouth daily. 30 tablet 6   No current facility-administered medications on file prior to visit.    No Known Allergies  Family History  Problem Relation Age of Onset  . Hypertension Mother   . Heart attack  Father   . Cancer Brother   . Diabetes Neg Hx     BP 128/86 mmHg  Pulse 73  Temp(Src) 98 F (36.7 C) (Oral)  Ht 5' 7.5" (1.715 m)  Wt 142 lb (64.411 kg)  BMI 21.90 kg/m2  SpO2 96%  Review of Systems No weight change    Objective:   Physical Exam VITAL SIGNS:  See vs page GENERAL: no distress Pulses: dorsalis pedis intact bilat.   MSK: no deformity of the feet CV: no leg edema Skin:  no ulcer on the feet.  normal color on the feet, but the feet are cool to touch. Neuro: sensation is intact to touch on the feet.     Lab Results  Component Value Date   CREATININE 1.2* 04/06/2015   BUN 39* 04/06/2015   NA 137 04/06/2015   K 5.3 04/06/2015   CL 98 03/07/2015   CO2 26 03/07/2015   A1c=6.4%    Assessment & Plan:  DM: overcontrolled, given this sulfonylurea-containing regimen.   Patient is advised the following: Patient Instructions  check your blood sugar twice a day.  vary the time of day when you check, between before the 3 meals, and at bedtime.  also check if you have symptoms of your blood sugar being too high or too low.  please keep a record of the readings and bring it to your next appointment here.  You can write it on any piece of paper.  please call us sooner if your blood sugar goes below 70, or if you have a lot of readings over 200. Please continue the same insulin, and: Reduce the glimepiride to 1/2 pill mg daily.  Please come back for a follow-up appointment in 2 months.

## 2016-01-17 ENCOUNTER — Encounter: Payer: Self-pay | Admitting: Cardiovascular Disease

## 2016-01-28 ENCOUNTER — Other Ambulatory Visit: Payer: Self-pay | Admitting: *Deleted

## 2016-01-28 MED ORDER — GLUCOSE BLOOD VI STRP: 1.0000 | ORAL_STRIP | Freq: Two times a day (BID) | Status: AC

## 2016-02-07 ENCOUNTER — Telehealth: Payer: Self-pay | Admitting: Cardiovascular Disease

## 2016-02-07 ENCOUNTER — Ambulatory Visit: Payer: Medicare Other | Attending: Otolaryngology | Admitting: Physical Therapy

## 2016-02-07 ENCOUNTER — Encounter: Payer: Self-pay | Admitting: Physical Therapy

## 2016-02-07 VITALS — BP 198/82 | HR 70

## 2016-02-07 DIAGNOSIS — R269 Unspecified abnormalities of gait and mobility: Secondary | ICD-10-CM | POA: Diagnosis present

## 2016-02-07 DIAGNOSIS — H8112 Benign paroxysmal vertigo, left ear: Secondary | ICD-10-CM | POA: Diagnosis present

## 2016-02-07 DIAGNOSIS — R42 Dizziness and giddiness: Secondary | ICD-10-CM | POA: Diagnosis present

## 2016-02-07 DIAGNOSIS — R2681 Unsteadiness on feet: Secondary | ICD-10-CM | POA: Diagnosis present

## 2016-02-07 NOTE — Therapy (Addendum)
[facility] [address] [place], [place], [address] Phone: [phone]   Fax:  [phone]  Physical Therapy Evaluation  and Discharge Summary   Patient Details  Name: Kelly Neal MRN: [id number] Date of Birth: [birth year] Referring Provider: Izora Gala, MD  Encounter Date: 02/07/2016      PT End of Session - 02/07/16 1933    Visit Number 1   Number of Visits 9  eval + 8 visits   Date for PT Re-Evaluation 03/08/16   Authorization Type Medicare traditional primary; AARP secondary - G Codes required   PT Start Time 1532   PT Stop Time 1638   PT Time Calculation (min) 66 min   Activity Tolerance Other (comment)  Limited by dizziness   Behavior During Therapy [facility] for tasks assessed/performed      Past Medical History  Diagnosis Date  . Hyperlipidemia   . Essential hypertension   . DM type 2 (diabetes mellitus, type 2) ([place])   . Paroxysmal atrial fibrillation ([place])     a. Dx 08/2014;  b. CHA2DS2VASc = 6-->Xarelto started 02/2015.  . Tachy-brady syndrome ([place])     a. 04/2013 s/p SJM 2210 Accent DC PPM, Ser#: [id number].  Marland Kitchen Chronic combined systolic and diastolic CHF (congestive heart failure) ([place])     a. 02/2015 Echo: EF 40%, mod LVH, Ao sclerosis w/o stenosis, triv MR.  . CKD (chronic kidney disease), stage III     Past Surgical History  Procedure Laterality Date  . Cataract extraction    . Permanent pacemaker insertion N/A 04/20/2013    Procedure: PERMANENT PACEMAKER INSERTION;  Surgeon: Evans Lance, MD;  Location: [facility] CATH LAB;  Service: Cardiovascular;  Laterality: N/A;  . Eye surgery      Filed Vitals:   02/07/16 1541 02/07/16 1639  BP: 199/74 198/82  Pulse: 70     Visit Diagnosis:  BPPV (benign paroxysmal positional vertigo), left - Plan: PT plan of care cert/re-cert  Dizziness and giddiness - Plan: PT plan of care cert/re-cert  Abnormality of gait - Plan: PT plan of care  cert/re-cert  Unsteadiness - Plan: PT plan of care cert/re-cert      Subjective Assessment - 02/07/16 1541    Subjective Pt reports dizziness for the past 3 years. Occurred 2 days after having cardiac pacemaker implanted. Pt states, "The only time I'm not dizzy is when I'm sleeping. When I'm sitting down and I'm really still, it's better." Denies diplopia, dysarthria, dysphagia, nausea. Pt denies falls. Was independent with all mobility prior to onset of dizziness 3 years ago. Since onset of dizziness, pt has not been able to drive and uses a cane for household mobility and RW for community mobility.    Patient is accompained by: Family member  daughter, [name]   Pertinent History PMH significant for: cardiac pacemaker, HTN, DM II, HLD, CHF, CKD   Patient Stated Goals "To get rid of this dizziness."   Currently in Pain? Yes   Pain Score 5    Pain Location Head   Pain Orientation Other (Comment)  generalized   Pain Descriptors / Indicators Headache   Pain Type Chronic pain   Pain Onset More than a month ago   Pain Frequency Constant   Aggravating Factors  "being up"   Pain Relieving Factors Tylenol and sitting still   Multiple Pain Sites No            [facility] PT Assessment - 02/07/16 0001  Assessment   Medical Diagnosis BPPV   Referring Provider Izora Gala, MD   Onset Date/Surgical Date 04/17/14   Precautions   Precautions Fall;ICD/Pacemaker   Balance Screen   Has the patient fallen in the past 6 months No   Has the patient had a decrease in activity level because of a fear of falling?  No   Is the patient reluctant to leave their home because of a fear of falling?  No   Home Environment   Living Environment Private residence   Living Arrangements Other (Comment)  lives with adult so, who has high-functioning Autism   Home Access Level entry   Home Layout One level   [place] - 2 wheels;[place] - single point   Prior Function   Level of Independence  Independent   Vocation Retired            Vestibular Assessment - 02/07/16 0001    Symptom Behavior   Type of Dizziness Oscillopsia  also describes "room moving" and imbalance   Frequency of Dizziness everytime pt moves   Duration of Dizziness seconds   Aggravating Factors Activity in general;Sit to stand   Relieving Factors Head stationary   Occulomotor Exam   Occulomotor Alignment Normal   Spontaneous Absent   Gaze-induced Right beating nystagmus with R gaze   Smooth Pursuits Intact   Saccades Poor trajectory  vertical > horizontal   Comment Unable to formally assess Head Thrust Test due to cervical spine muscle guarding    Vestibulo-Occular Reflex   VOR 1 Head Only (x 1 viewing) Unable to perform due to signficant dizziness, blurred vision with head movement.   VOR Cancellation Unable to maintain gaze   Comment --   Positional Testing   Dix-Hallpike Dix-Hallpike Left   Sidelying Test Sidelying Right;Sidelying Left   Dix-Hallpike Left   Dix-Hallpike Left Duration 15 seconds   Dix-Hallpike Left Symptoms Upbeat, left rotatory nystagmus   Sidelying Left   Sidelying Left Duration 15 seconds   Sidelying Left Symptoms Upbeat, left rotatory nystagmus   Orthostatics   BP supine (x 5 minutes) 195/84 mmHg   HR supine (x 5 minutes) 68   BP sitting 193/78 mmHg   HR sitting 71               [facility] Adult PT Treatment/Exercise - 02/07/16 0001    Transfers   Transfers Sit to Stand;Stand to Sit   Sit to Stand 4: Min guard  with RW   Stand to Sit 4: Min guard  with RW   Ambulation/Gait   Ambulation/Gait Yes   Ambulation/Gait Assistance 4: Min guard   Ambulation Distance (Feet) 75 Feet  x2   Assistive device Rolling walker   Gait Pattern Step-through pattern;Decreased stride length;Decreased trunk rotation  minimal head movement; turns en bloc   Ambulation Surface Level;Indoor         Vestibular Treatment/Exercise - 02/07/16 0001    Vestibular Treatment/Exercise    Vestibular Treatment Provided Canalith Repositioning   Canalith Repositioning Epley Manuever Left    EPLEY MANUEVER LEFT   Number of Reps  1    RESPONSE DETAILS LEFT At end of L Epley Maneuver, pt reported onset of feeling "like there is electricity in my face." Assessed BP, which was 195/84.               PT Education - 02/07/16 1905    Education provided Yes   Education Details Pt eval findings, goals, and POC. Explained BPPV  and treatment. Educated on risks of elevated BP; recommended pt go to urgent care due to elevated BP and feeling of "electricity in the face."   Person(s) Educated Patient;Child(ren)  daughter, [name]   Methods Explanation   Comprehension Verbalized understanding          PT Short Term Goals - 02/17/16 1948    PT SHORT TERM GOAL #1   Title STG's = LTG's           PT Long Term Goals - 02/17/16 1949    PT LONG TERM GOAL #1   Title Pt will perform HEP with assistance of daughter to maximize functional gains made in PT.   (Target date: 03/06/16)   PT LONG TERM GOAL #2   Title Positional vertigo testing will be negative to indicate resolved BPPV.     PT LONG TERM GOAL #3   Title Pt will decrease DHI score from 76 to < / = 58 to indicate significant decrease in pt-perceived disability due to dizziness.    PT LONG TERM GOAL #4   Title Assess strength and balance, if indicated, after vertigo clears.                Plan - 2016-02-17 1935    Clinical Impression Statement Pt is an 82 y/o F referred to vestibular PT to address functional impairments associated with vertigo. PMH significant for: cardiac pacemaker, HTN, DM II, HLD, CHF, CKD. PT evaluation reveals the following: L upbeating torsional nystagmus accompanied by vertiginous symptoms on L Dix-Hallpike; R gaze-evoked nystagmus; and impaired VOR; pt-described oscillopsia; dizziness and blurred vision with head movement. Based on findings, unable to rule out L posterior canalithiasis and  vestibular hypofunction. Therefore, treated with L Epley Maneuver x1. At end of L Epley, pt reported onset of feeling "like there is electricity in my face." Assessed BP, which was 195/84. PT unable to reach PCP via phone; therefore, recommended pt go to urgent care after session. Pt/daughter verbally agreed. Pt will benefit from skilled outpatient PT 2x/week for 4 weeks to address vertigo, imbalance, and gait instability.   Pt will benefit from skilled therapeutic intervention in order to improve on the following deficits Abnormal gait;Decreased balance;Dizziness;Pain;Other (comment)  Pain will be monitored but not directly addressed by PT due to nature of referral   Rehab Potential Fair   Clinical Impairments Affecting Rehab Potential multiple comorbidities; chronicity of dizziness   PT Frequency 2x / week   PT Duration 4 weeks   PT Treatment/Interventions Canalith Repostioning;Vestibular;Gait training;Neuromuscular re-education;Stair training;Functional mobility training;Therapeutic activities;Therapeutic exercise;Balance training;Patient/family education;Orthotic Fit/Training;DME Instruction   PT Next Visit Plan Check BP. Reassess for BPPV (L PC) and treat prn. Initiate HEP (balance, gaze).  Teach patient compensatory strategies for impaired VOR during gait.   Consulted and Agree with Plan of Care Patient;Family member/caregiver   Family Member Consulted daughter, Cheyenne Adas - 2016/02/17 1952    Functional Assessment Tool Used DHI = 76   Functional Limitation Self care   Self Care Current Status [id number]) At least 60 percent but less than 80 percent impaired, limited or restricted   Self Care Goal Status (O7096) At least 40 percent but less than 60 percent impaired, limited or restricted       Problem List Patient Active Problem List   Diagnosis Date Noted  . Diabetes ([place]) 01/17/2016  . Long term (current) use of anticoagulants 12/13/2015  . Essential hypertension   . CKD  (chronic  kidney disease) stage 4, GFR 15-29 ml/min ([facility]) 03/09/2015  . Peripheral edema   . Proteinuria   . Chronic combined systolic and diastolic CHF (congestive heart failure) ([place]) 03/03/2015  . Paroxysmal atrial fibrillation ([place]) 09/05/2014  . Dizzy spells 05/26/2013  . Cardiac pacemaker in situ-St Jude implanted 04/20/13 04/21/2013  . Second degree atrioventricular block, Mobitz (type) I 04/19/2013  . Hyperlipidemia     Billie Ruddy, PT, DPT [facility] [address] [place], [place], [address] Phone: [phone]   Fax:  [phone] 02/07/2016, 7:58 PM   Name: SARGUN RUMMELL MRN: [id number] Date of Birth: 1928-09-20   PHYSICAL THERAPY DISCHARGE SUMMARY  Visits from Start of Care: 1  Current functional level related to goals / functional outcomes: Unknown, as patient did not return to PT after initial evaluation.    Remaining deficits: Unknown, as patient did not return to PT after initial evaluation.    Education / Equipment: See above.  Plan:                                                    Patient goals were not met. Patient is being discharged due to not returning since the last visit.  ?????         Billie Ruddy, PT, DPT [facility] [address] [place], [place], [address] Phone: [phone]   Fax:  [phone] 10/08/16, 9:58 AM

## 2016-02-07 NOTE — Telephone Encounter (Signed)
Spoke with pt and dtr, the pt just left PT for inner ear and her bp was 198/82. They told them to go to the urgent care and call us. Pt feels fine, she has only eaten a pack of crackers today and it is time for her evening dose of carvedilol. Advised the pt to go home, eat, take her pm dose of carvedilol, rest and then after about an hour retake her bp at home. Pt agreed with this plan.  Patient voiced understanding to call if bp is still elevated and they are concerned.

## 2016-02-13 ENCOUNTER — Telehealth: Payer: Self-pay | Admitting: Cardiovascular Disease

## 2016-02-13 NOTE — Telephone Encounter (Signed)
SPOKE TO PHYSICAL THERAPIST   DO  NOT THINK THAT PATIENT'S DIZZINESS IS COMING FROM ELEVATED OTRHOSTATICS    BUT WILL SEND TO DR CROITORU TO SEE IF HAS ANY RECOMMENDATIONS  ANDREA VERBALIZED UNDERSTANDING.

## 2016-02-13 NOTE — Telephone Encounter (Signed)
NOTIFIED ANDREA

## 2016-02-13 NOTE — Telephone Encounter (Signed)
Seth Bake is calling because Kelly Neal is (sitting) in (R) arm is 152/70 and standing it went to 144/70 in (R) arm in Supine it was 180/84 in the (L) arm and right arm it was 182/82 and she is on Lasik 20mg  once a day and Coreg 1 tablet twice a day Wondering if the dizziness can be cause by her blood Neal being Elevated . Please call    Thanks

## 2016-02-13 NOTE — Telephone Encounter (Signed)
I agree, I do not think that is the cause for her dizziness

## 2016-02-18 ENCOUNTER — Encounter: Payer: Medicare Other | Admitting: Physical Therapy

## 2016-02-19 ENCOUNTER — Telehealth: Payer: Self-pay | Admitting: Cardiovascular Disease

## 2016-02-19 NOTE — Telephone Encounter (Signed)
New Message   [facility] called request a call back to discuss the pt's symptoms of Dizziness Completed a full eval and it looks as if  her medications could be the root of the issue; please call

## 2016-02-19 NOTE — Telephone Encounter (Signed)
Spoke with Kelly Neal, the pt cont to c/o dizziness. She is not orthostatic and does not seem to be vertigo. Kelly Neal is wondering if carvedilol could be the cause of the dizziness. She wanted to make dr croitoru aware

## 2016-02-19 NOTE — Telephone Encounter (Signed)
That is definitely a possibility and we have been slowly cutting back on all her heart failure medications because of orthostatic hypotension. She is now on the lowest possible dose of carvedilol. Lets stop it and see if it makes any difference in how she feels.

## 2016-02-20 NOTE — Telephone Encounter (Signed)
Spoke with pt, aware to stop the carvedilol. She will let us know of further problems. Left message for kelly of recommendations.

## 2016-02-21 ENCOUNTER — Encounter: Payer: Medicare Other | Admitting: Physical Therapy

## 2016-02-21 ENCOUNTER — Telehealth: Payer: Self-pay | Admitting: Cardiovascular Disease

## 2016-02-21 MED ORDER — AMLODIPINE BESYLATE 5 MG PO TABS: 5.0000 mg | ORAL_TABLET | Freq: Every day | ORAL | Status: DC

## 2016-02-21 NOTE — Telephone Encounter (Signed)
Follow up      Pt c/o BP issue: STAT if pt c/o blurred vision, one-sided weakness or slurred speech  1. What are your last 5 BP readings?180/83 taken at 4:20 2. Are you having any other symptoms (ex. Dizziness, headache, blurred vision, passed out)?   3. What is your BP issue?  Calling to give latest bp reading

## 2016-02-21 NOTE — Telephone Encounter (Signed)
Called pts daughter back. Told her Rx for amlodipine sent to pharmacy.

## 2016-02-21 NOTE — Telephone Encounter (Signed)
Try amlodipine 5 mg daily

## 2016-02-21 NOTE — Telephone Encounter (Signed)
NEw MEssage  PT w/ [facility] calling to inform RN-  BP reading- 190/92 today 4/6- stated has not taken 2 doses of Coreg- is there something else she can take. Please call back and discuss.

## 2016-02-21 NOTE — Telephone Encounter (Signed)
Pt concerned about BP elevated. BP 190/92 in am today, BP 177/88 at 1415 today. Pt denies headache, CP, or palpitations. States she is always dizzy and has been since her pacemaker was placed 3 years ago. Pt took her last dose of carvedilol on 4/5 in am as advised from previous telephone encounter on 4/4. Her blood sugars have been normal. Pt concerned that her BP still high and should she try another BP medication in place of her carvedilol. Please advise.

## 2016-02-25 ENCOUNTER — Encounter: Payer: Medicare Other | Admitting: Physical Therapy

## 2016-02-28 ENCOUNTER — Telehealth: Payer: Self-pay | Admitting: *Deleted

## 2016-02-28 ENCOUNTER — Encounter: Payer: Medicare Other | Admitting: Physical Therapy

## 2016-02-28 NOTE — Telephone Encounter (Signed)
Agree that pt needs appt before any meds can be rx

## 2016-02-28 NOTE — Telephone Encounter (Signed)
Kelly Neal with [facility] PT called and stated that patient was having vertigo and wanted to know if we would prescribed Meclizine or Antivert to help with vertigo. Patient has not been seen since July last year and last appointment was canceled. Informed Kelly Neal that patient needs an appointment to follow up with Dr. Eulas Post regarding the vertigo.

## 2016-03-03 ENCOUNTER — Encounter: Payer: Medicare Other | Admitting: Physical Therapy

## 2016-03-04 ENCOUNTER — Telehealth: Payer: Self-pay | Admitting: Endocrinology

## 2016-03-04 NOTE — Telephone Encounter (Signed)
Patient stated that her legs are bothering her, please advise

## 2016-03-04 NOTE — Telephone Encounter (Signed)
I contacted the pt. Pt stated she felt like she is having circulation issues in her legs and they are affecting her feet. Pt was advised she could mover her appointment up to 03/05/2016 at 945 or keep the appointment for 03/17/2016. Pt stated she would check with her daughter about moving the appointment up and call us back.

## 2016-03-05 ENCOUNTER — Encounter: Payer: Medicare Other | Admitting: Physical Therapy

## 2016-03-05 ENCOUNTER — Encounter: Payer: Self-pay | Admitting: Endocrinology

## 2016-03-05 ENCOUNTER — Ambulatory Visit (INDEPENDENT_AMBULATORY_CARE_PROVIDER_SITE_OTHER): Payer: Medicare Other | Admitting: Endocrinology

## 2016-03-05 VITALS — BP 144/80 | HR 76 | Temp 97.6°F | Ht 67.5 in | Wt 139.0 lb

## 2016-03-05 DIAGNOSIS — N183 Chronic kidney disease, stage 3 (moderate): Secondary | ICD-10-CM

## 2016-03-05 DIAGNOSIS — E1022 Type 1 diabetes mellitus with diabetic chronic kidney disease: Secondary | ICD-10-CM | POA: Diagnosis not present

## 2016-03-05 MED ORDER — INSULIN DETEMIR 100 UNIT/ML FLEXPEN: 12.0000 [IU] | PEN_INJECTOR | SUBCUTANEOUS | Status: AC

## 2016-03-05 NOTE — Patient Instructions (Addendum)
check your blood sugar twice a day.  vary the time of day when you check, between before the 3 meals, and at bedtime.  also check if you have symptoms of your blood sugar being too high or too low.  please keep a record of the readings and bring it to your next appointment here.  You can write it on any piece of paper.  please call us sooner if your blood sugar goes below 70, or if you have a lot of readings over 200. Please increase the levmir to 12 units each morning.  Please stop taking the glimepiride.  Please come back for a follow-up appointment in 4 months.

## 2016-03-05 NOTE — Progress Notes (Signed)
Subjective:    Patient ID: Kelly Neal, female    DOB: Mar 22, 1928, 82 y.o.   MRN: [id number]  HPI Pt returns for f/u of diabetes mellitus: DM type: 1 Dx'ed: 3428 Complications: renal insufficiency.  Therapy: insulin since 2016 GDM: never DKA: never Severe hypoglycemia: never. Pancreatitis: never Other: she chose a qd insulin regimen Interval history: dtr brings a record of her cbg's which i have reviewed today.  It varies from 101-152 in am, which is the only time of day she checks.  pt states she feels well in general, except for ongoing chronic lightheadedness.  Past Medical History  Diagnosis Date  . Hyperlipidemia   . Essential hypertension   . DM type 2 (diabetes mellitus, type 2) ([place])   . Paroxysmal atrial fibrillation ([place])     a. Dx 08/2014;  b. CHA2DS2VASc = 6-->Xarelto started 02/2015.  . Tachy-brady syndrome ([place])     a. 04/2013 s/p SJM 2210 Accent DC PPM, Ser#: [id number].  Marland Kitchen Chronic combined systolic and diastolic CHF (congestive heart failure) ([place])     a. 02/2015 Echo: EF 40%, mod LVH, Ao sclerosis w/o stenosis, triv MR.  . CKD (chronic kidney disease), stage III     Past Surgical History  Procedure Laterality Date  . Cataract extraction    . Permanent pacemaker insertion N/A 04/20/2013    Procedure: PERMANENT PACEMAKER INSERTION;  Surgeon: Evans Lance, MD;  Location: [facility] CATH LAB;  Service: Cardiovascular;  Laterality: N/A;  . Eye surgery      Social History   Social History  . Marital Status: Married    Spouse Name: N/A  . Number of Children: N/A  . Years of Education: N/A   Occupational History  . Not on file.   Social History Main Topics  . Smoking status: Former Smoker -- 2 years    Types: Cigarettes    Quit date: 11/17/1950  . Smokeless tobacco: Not on file  . Alcohol Use: No  . Drug Use: No  . Sexual Activity: No   Other Topics Concern  . Not on file   Social History Narrative   Diet:    Do you drink/eat things with caffeine? No     Marital status: Widowed                            What year were you married? 1954   Do you live in a house, apartment, assisted living, condo, trailer, etc)? House   Is it one or more stories? No   How many persons live in your home? 2/3   Do you have any pets in your home? No    Current or past profession: Statistician   Do you exercise?  little                                               Type & how often: daily   Do you have a living will? yes   Do you have a DNR Form? yes   Do you have a POA/HPOA forms? yes    Current Outpatient Prescriptions on File Prior to Visit  Medication Sig Dispense Refill  . acetaminophen (TYLENOL) 325 MG tablet Take 650 mg by mouth every 6 (six) hours as needed for mild pain.    Marland Kitchen ALPRAZolam (XANAX) 0.25  MG tablet Take 1/2 tablet in AM and at midday as needed for anxiety; take 1 tablet at bedtime for sleep. 60 tablet 1  . amLODipine (NORVASC) 5 MG tablet Take 1 tablet (5 mg total) by mouth daily. 30 tablet 4  . B-D ULTRAFINE III SHORT PEN 31G X 8 MM MISC Use to inject insulin 1 time per day 100 each 2  . BAYER MICROLET LANCETS lancets 2 (two) times daily. for testing  0  . Blood Glucose Monitoring Suppl (BAYER CONTOUR NEXT MONITOR) W/DEVICE KIT Use to check blood sugar 2 times per day 1 kit 2  . furosemide (LASIX) 20 MG tablet Take 1 tablet (20 mg total) by mouth daily. 90 tablet 3  . glucose blood (BAYER CONTOUR NEXT TEST) test strip 1 each by Other route 2 (two) times daily. And lancets 2/day. Dx: E11.22 200 each 2  . Melatonin 3 MG TABS Take 1 tablet by mouth at bedtime.    . Rivaroxaban (XARELTO) 15 MG TABS tablet Take 1 tablet (15 mg total) by mouth daily. 30 tablet 6   No current facility-administered medications on file prior to visit.    No Known Allergies  Family History  Problem Relation Age of Onset  . Hypertension Mother   . Heart attack Father   . Cancer Brother   . Diabetes Neg Hx     BP 144/80 mmHg  Pulse 76  Temp(Src) 97.6  F (36.4 C) (Oral)  Ht 5' 7.5" (1.715 m)  Wt 139 lb (63.05 kg)  BMI 21.44 kg/m2  SpO2 97%  Review of Systems She denies hypoglycemia.     Objective:   Physical Exam VITAL SIGNS:  See vs page GENERAL: no distress SKIN:  Insulin injection sites at the anterior abdomen are normal.        Assessment & Plan:  DM: she is ready to transition off oral rx.  Patient is advised the following: Patient Instructions  check your blood sugar twice a day.  vary the time of day when you check, between before the 3 meals, and at bedtime.  also check if you have symptoms of your blood sugar being too high or too low.  please keep a record of the readings and bring it to your next appointment here.  You can write it on any piece of paper.  please call us sooner if your blood sugar goes below 70, or if you have a lot of readings over 200. Please increase the levmir to 12 units each morning.  Please stop taking the glimepiride.  Please come back for a follow-up appointment in 4 months.

## 2016-03-06 DIAGNOSIS — E119 Type 2 diabetes mellitus without complications: Secondary | ICD-10-CM | POA: Insufficient documentation

## 2016-03-11 ENCOUNTER — Other Ambulatory Visit: Payer: Self-pay | Admitting: *Deleted

## 2016-03-11 MED ORDER — FUROSEMIDE 20 MG PO TABS: 20.0000 mg | ORAL_TABLET | Freq: Every day | ORAL | Status: DC

## 2016-03-12 ENCOUNTER — Encounter: Payer: Self-pay | Admitting: Internal Medicine

## 2016-03-12 ENCOUNTER — Ambulatory Visit (INDEPENDENT_AMBULATORY_CARE_PROVIDER_SITE_OTHER): Payer: Medicare Other | Admitting: Internal Medicine

## 2016-03-12 ENCOUNTER — Encounter: Payer: Medicare Other | Admitting: *Deleted

## 2016-03-12 VITALS — BP 162/78 | HR 74 | Temp 97.6°F | Resp 20 | Ht 67.5 in | Wt 140.0 lb

## 2016-03-12 DIAGNOSIS — I5042 Chronic combined systolic (congestive) and diastolic (congestive) heart failure: Secondary | ICD-10-CM | POA: Diagnosis not present

## 2016-03-12 DIAGNOSIS — I480 Paroxysmal atrial fibrillation: Secondary | ICD-10-CM

## 2016-03-12 DIAGNOSIS — E785 Hyperlipidemia, unspecified: Secondary | ICD-10-CM

## 2016-03-12 DIAGNOSIS — R1900 Intra-abdominal and pelvic swelling, mass and lump, unspecified site: Secondary | ICD-10-CM

## 2016-03-12 DIAGNOSIS — Z0000 Encounter for general adult medical examination without abnormal findings: Secondary | ICD-10-CM | POA: Insufficient documentation

## 2016-03-12 DIAGNOSIS — E118 Type 2 diabetes mellitus with unspecified complications: Secondary | ICD-10-CM

## 2016-03-12 DIAGNOSIS — R42 Dizziness and giddiness: Secondary | ICD-10-CM

## 2016-03-12 DIAGNOSIS — H542 Low vision, both eyes: Secondary | ICD-10-CM

## 2016-03-12 DIAGNOSIS — Z794 Long term (current) use of insulin: Secondary | ICD-10-CM | POA: Diagnosis not present

## 2016-03-12 DIAGNOSIS — H543 Unqualified visual loss, both eyes: Secondary | ICD-10-CM

## 2016-03-12 DIAGNOSIS — I10 Essential (primary) hypertension: Secondary | ICD-10-CM | POA: Diagnosis not present

## 2016-03-12 DIAGNOSIS — K1379 Other lesions of oral mucosa: Secondary | ICD-10-CM

## 2016-03-12 DIAGNOSIS — R198 Other specified symptoms and signs involving the digestive system and abdomen: Secondary | ICD-10-CM

## 2016-03-12 MED ORDER — ALPRAZOLAM 0.25 MG PO TABS: ORAL_TABLET | ORAL | Status: DC

## 2016-03-12 NOTE — Patient Instructions (Addendum)
Encouraged her to exercise 30-45 minutes 4-5 times per week. Eat a well balanced diet. Avoid smoking. Limit alcohol intake. Wear seatbelt when riding in the car. Wear sun block (SPF >50) when spending extended times outside.  Follow up with dentist  Follow up with eye specialist  Will call with lab results  Continue current medications as ordered  May stop eye exercises due to increased dizziness  Follow up in 3 mos for routine visit

## 2016-03-12 NOTE — Progress Notes (Signed)
Patient ID: Kelly Neal, female   DOB: [birth year], 82 y.o.   MRN: [id number]   Location:   [facility]   Place of Service:   OFFICE  Provider: DR Arletha Grippe  Patient Care Team: Gildardo Cranker, DO as PCP - General (Internal Medicine) Donato Heinz, MD as Consulting Physician (Nephrology)  Extended Emergency Contact Information Primary Emergency Contact: Pegram,[name] Address: [address]          [place], [place] [address] Johnnette Litter of [place] Phone: [phone] Mobile Phone: [phone] Relation: Daughter  Code Status: DNR Goals of Care: Advanced Directive information Advanced Directives 03/12/2016  Does patient have an advance directive? Yes  Type of Advance Directive Out of facility DNR (pink MOST or yellow form)  Does patient want to make changes to advanced directive? No - Patient declined  Copy of advanced directive(s) in chart? Yes     Chief Complaint  Patient presents with  . Annual Exam    Annual Exam, Patients has complaint of dizzy spells  . OTHER    Daughter in room with patient   . Immunizations    Discuss Zoster and Pnuemonia    HPI: Patient is a 82 y.o. female seen in today for an annual wellness exam.  She has c/a vision and dizziness.  DM - BS 110s usually. Occasional low BS reactions (faint sensation, nervousness and diaphoretic). A1c 6.4%. amaryl recently reduced to 51m daily. She also takes levemir 12 units daily. She sees Endo Dr ELoanne Drilling   She was hospitalized in April 2016 for biventricular  CHF (EF 40%) and was sent to SNF for rehab. She completed short term rehab and d/c'd home. She completed home PT. Her daughter lives with her and also her special needs son.  She had pacer placed in 2014 due to tachy-brady syndrome. She takes xeralto for PAF anticoagulation  She has experienced dizziness and flushed sensation since her pacemaker was placed in 2014. She has seen several specialist but no dx made and pacer has been working  appropriately. She sees cardio on a regular basis. She also completed vestibular rehab but states dizziness is worse after rehab. She tried eye exercises x 2 weeks without improvement.  She has CKD and is followed by nephro - last labs done in May 2016  She has increased crust in eyes that worse upon awakening. She is followed by eye specialist. OS cats done several yrs ago but it only helped 10%. She wears glasses but can only see large print at close range.    [facility] 03/12/2016 06/13/2015 04/12/2015  Decreased Interest 0 0 0  Down, Depressed, Hopeless 0 0 0  PHQ - 2 Score 0 0 0    Fall Risk  03/12/2016 06/13/2015 04/12/2015  Falls in the past year? No No No   MMSE - Mini Mental State Exam 03/12/2016  Not completed: (No Data)  Orientation to time 5  Orientation to Place 5  Registration 3  Attention/ Calculation 5  Recall 2  Language- name 2 objects 2  Language- repeat 1  Language- follow 3 step command 3  Language- read & follow direction 1  Write a sentence 1  Copy design 1  Total score 29     Health Maintenance  Topic Date Due  . OPHTHALMOLOGY EXAM  08/20/1940  . TETANUS/TDAP  08/20/1949  . ZOSTAVAX  08/20/1990  . DEXA SCAN  08/21/1995  . PNA vac Low Risk Adult (1 of 2 - PCV13) 08/21/1995  .  URINE MICROALBUMIN  02/21/2013  . INFLUENZA VACCINE  06/17/2016  . HEMOGLOBIN A1C  07/18/2016  . FOOT EXAM  01/15/2017    Urinary incontinence? Incontinence with laughing   Functional Status Survey: Is the patient deaf or have difficulty hearing?: No Does the patient have difficulty seeing, even when wearing glasses/contacts?: Yes Does the patient have difficulty concentrating, remembering, or making decisions?: No Does the patient have difficulty walking or climbing stairs?: Yes Does the patient have difficulty dressing or bathing?: Yes (due to constant dizziness) Does the patient have difficulty doing errands alone such as visiting a doctor's office or  shopping?: Yes Exercise? Poor tolerance due to dizziness  Diet? Poor meal prep due to dizziness   Visual Acuity Screening   Right eye Left eye Both eyes  Without correction: 20/70 20/200 20/50  With correction:      Dentition: no dentures. Last dental visit 2-3 yrs ago  Pain: none  Past Medical History  Diagnosis Date  . Hyperlipidemia   . Essential hypertension   . DM type 2 (diabetes mellitus, type 2) ([place])   . Paroxysmal atrial fibrillation ([place])     a. Dx 08/2014;  b. CHA2DS2VASc = 6-->Xarelto started 02/2015.  . Tachy-brady syndrome ([place])     a. 04/2013 s/p SJM 22[facility] PPM, Ser#: [id number].  Marland Kitchen Chronic combined systolic and diastolic CHF (congestive heart failure) ([place])     a. 02/2015 Echo: EF 40%, mod LVH, Ao sclerosis w/o stenosis, triv MR.  . CKD (chronic kidney disease), stage III     Past Surgical History  Procedure Laterality Date  . Cataract extraction    . Permanent pacemaker insertion N/A 04/20/2013    Procedure: PERMANENT PACEMAKER INSERTION;  Surgeon: Evans Lance, MD;  Location: [facility] CATH LAB;  Service: Cardiovascular;  Laterality: N/A;  . Eye surgery     Family History  Problem Relation Age of Onset  . Hypertension Mother   . Heart attack Father   . Cancer Brother   . Diabetes Neg Hx     Social History   Social History  . Marital Status: Married    Spouse Name: N/A  . Number of Children: N/A  . Years of Education: N/A   Occupational History  . Not on file.   Social History Main Topics  . Smoking status: Former Smoker -- 2 years    Types: Cigarettes    Quit date: 11/17/1950  . Smokeless tobacco: Not on file  . Alcohol Use: No  . Drug Use: No  . Sexual Activity: No   Other Topics Concern  . Not on file   Social History Narrative   Diet:    Do you drink/eat things with caffeine? No   Marital status: Widowed                            What year were you married? 1954   Do you live in a house, apartment, assisted living, condo,  trailer, etc)? House   Is it one or more stories? No   How many persons live in your home? 2/3   Do you have any pets in your home? No    Current or past profession: Statistician   Do you exercise?  little  Type & how often: daily   Do you have a living will? yes   Do you have a DNR Form? yes   Do you have a POA/HPOA forms? yes    No Known Allergies    Medication List       This list is accurate as of: 03/12/16  1:09 PM.  Always use your most recent med list.               acetaminophen 325 MG tablet  Commonly known as:  TYLENOL  Take 650 mg by mouth every 6 (six) hours as needed for mild pain.     ALPRAZolam 0.25 MG tablet  Commonly known as:  XANAX  Take 1/2 tablet in AM and at midday as needed for anxiety; take 1 tablet at bedtime for sleep.     amLODipine 5 MG tablet  Commonly known as:  NORVASC  Take 1 tablet (5 mg total) by mouth daily.     B-D ULTRAFINE III SHORT PEN 31G X 8 MM Misc  Generic drug:  Insulin Pen Needle  Use to inject insulin 1 time per day     BAYER CONTOUR NEXT MONITOR w/Device Kit  Use to check blood sugar 2 times per day     BAYER MICROLET LANCETS lancets  2 (two) times daily. for testing     furosemide 20 MG tablet  Commonly known as:  LASIX  Take 1 tablet (20 mg total) by mouth daily.     glucose blood test strip  Commonly known as:  BAYER CONTOUR NEXT TEST  1 each by Other route 2 (two) times daily. And lancets 2/day. Dx: E11.22     Insulin Detemir 100 UNIT/ML Pen  Commonly known as:  LEVEMIR FLEXTOUCH  Inject 12 Units into the skin every morning. And pen needles 1/day     Melatonin 3 MG Tabs  Take 1 tablet by mouth at bedtime.     Rivaroxaban 15 MG Tabs tablet  Commonly known as:  XARELTO  Take 1 tablet (15 mg total) by mouth daily.         Review of Systems:  Review of Systems  Constitutional: Positive for fatigue.  Genitourinary: Positive for frequency (with nocturia).    Musculoskeletal: Positive for back pain, arthralgias and gait problem.  Neurological: Positive for dizziness, weakness and numbness.  All other systems reviewed and are negative.   Physical Exam: Filed Vitals:   03/12/16 1002  BP: 162/78  Pulse: 74  Temp: 97.6 F (36.4 C)  TempSrc: Oral  Resp: 20  Height: 5' 7.5" (1.715 m)  Weight: 140 lb (63.504 kg)  SpO2: 98%   Body mass index is 21.59 kg/(m^2). Physical Exam  Constitutional: She is oriented to person, place, and time. She appears well-developed and well-nourished. No distress.  HENT:  Head: Normocephalic and atraumatic.  Right Ear: Hearing, tympanic membrane, external ear and ear canal normal.  Left Ear: Hearing, tympanic membrane, external ear and ear canal normal.  Mouth/Throat: Uvula is midline, oropharynx is clear and moist and mucous membranes are normal. She does not have dentures.  Eyes: Conjunctivae, EOM and lids are normal. Pupils are equal, round, and reactive to light. No scleral icterus.  Neck: Trachea normal and normal range of motion. Neck supple. Carotid bruit is not present. No thyroid mass and no thyromegaly present.  Cardiovascular: Normal rate, regular rhythm, normal heart sounds and intact distal pulses.  Exam reveals no gallop and no friction rub.   No murmur heard.  No carotid bruit b/l. No LE edema b/l. No calf TTP.   Pulmonary/Chest: Effort normal and breath sounds normal. She has no wheezes. She has no rhonchi. She has no rales. Right breast exhibits no inverted nipple, no mass, no nipple discharge, no skin change and no tenderness. Left breast exhibits no inverted nipple, no mass, no nipple discharge, no skin change and no tenderness. Breasts are symmetrical.  Abdominal: Soft. Normal appearance, normal aorta and bowel sounds are normal. She exhibits distension. She exhibits no pulsatile midline mass and no mass. There is no hepatosplenomegaly. There is no tenderness. There is no rigidity, no rebound and  no guarding. No hernia.  Musculoskeletal: She exhibits edema (left knee and ankle).  Reduced left hip internal rotation and flexion  Lymphadenopathy:       Head (right side): No posterior auricular adenopathy present.       Head (left side): No posterior auricular adenopathy present.    She has no cervical adenopathy.       Right: No supraclavicular adenopathy present.       Left: No supraclavicular adenopathy present.  Neurological: She is alert and oriented to person, place, and time. She has normal strength. No cranial nerve deficit. Gait normal.  Skin: Skin is warm, dry and intact. No rash noted. Nails show no clubbing.  Psychiatric: She has a normal mood and affect. Her speech is normal and behavior is normal. Judgment and thought content normal. Cognition and memory are normal.     ICD-9-CM ICD-10-CM   1. Well adult exam V70.0 Z00.00   2. Dizzinesses 780.4 R42 CMP  3. Decreased vision in both eyes 369.20 H54.2   4. Gum symptoms 528.9 K13.79   5. Paroxysmal atrial fibrillation (HCC) 427.31 I48.0   6. Chronic combined systolic and diastolic CHF (congestive heart failure) (HCC) 428.42 I50.42 CMP   428.0    7. Type 2 diabetes mellitus with complication, with long-term current use of insulin (HCC) 250.90 E11.8 Microalbumin/Creatinine Ratio, Urine   V58.67 Z79.4   8. Essential hypertension 401.9 I10   9. Hyperlipidemia 272.4 E78.5 Lipid Panel  10. Increased abdominal girth 789.30 R19.00 US Abdomen Complete    Labs reviewed: Basic Metabolic Panel:  Recent Labs  04/06/15  NA 137  K 5.3  BUN 39*  CREATININE 1.2*   Liver Function Tests:  Recent Labs  04/06/15  AST 17  ALT 17  ALKPHOS 54   No results for input(s): LIPASE, AMYLASE in the last 8760 hours. No results for input(s): AMMONIA in the last 8760 hours. CBC:  Recent Labs  04/06/15  WBC 8.0  HGB 12.4  HCT 36  PLT 266   Lipid Panel: No results for input(s): CHOL, HDL, LDLCALC, TRIG, CHOLHDL, LDLDIRECT in the  last 8760 hours. Lab Results  Component Value Date   HGBA1C 6.4 01/16/2016    Procedures: No results found.  Assessment/Plan   ICD-9-CM ICD-10-CM   1. Well adult exam V70.0 Z00.00   2. Dizzinesses 780.4 R42 CMP  3. Decreased vision in both eyes 369.20 H54.2   4. Gum symptoms 528.9 K13.79   5. Paroxysmal atrial fibrillation (HCC) 427.31 I48.0   6. Chronic combined systolic and diastolic CHF (congestive heart failure) (HCC) 428.42 I50.42 CMP   428.0    7. Type 2 diabetes mellitus with complication, with long-term current use of insulin (HCC) 250.90 E11.8 Microalbumin/Creatinine Ratio, Urine   V58.67 Z79.4   8. Essential hypertension 401.9 I10   9. Hyperlipidemia 272.4 E78.5 Lipid Panel  10. Increased abdominal girth 789.30 R19.00 US Abdomen Complete   Pt is UTD on health maintenance. Vaccinations are UTD. Pt maintains a healthy lifestyle. Encouraged pt to exercise 30-45 minutes 4-5 times per week. Eat a well balanced diet. Avoid smoking. Limit alcohol intake. Wear seatbelt when riding in the car. Wear sun block (SPF >50) when spending extended times outside.   Follow up with dentist  Follow up with eye specialist (has seen Dr Bing Plume in the past)  Will call with lab results  Continue current medications as ordered  May stop eye exercises due to increased dizziness  Follow up in 3 mos for routine visit  Aldo Sondgeroth S. Perlie Gold  [facility] [address] [place], [place] [address] [phone] Cell (Monday-Friday 8 AM - 5 PM) [phone] After 5 PM and follow prompts

## 2016-03-12 NOTE — Addendum Note (Signed)
Addended by: Gildardo Cranker on: 03/12/2016 01:10 PM   Modules accepted: Level of Service, SmartSet

## 2016-03-13 ENCOUNTER — Other Ambulatory Visit: Payer: Self-pay | Admitting: *Deleted

## 2016-03-13 LAB — COMPREHENSIVE METABOLIC PANEL
ALBUMIN: 3.8 g/dL (ref 3.5–4.7)
ALK PHOS: 67 IU/L (ref 39–117)
ALT: 14 IU/L (ref 0–32)
AST: 15 IU/L (ref 0–40)
Albumin/Globulin Ratio: 1.3 (ref 1.2–2.2)
BILIRUBIN TOTAL: 0.5 mg/dL (ref 0.0–1.2)
BUN/Creatinine Ratio: 30 — ABNORMAL HIGH (ref 12–28)
BUN: 37 mg/dL — ABNORMAL HIGH (ref 8–27)
CALCIUM: 9.7 mg/dL (ref 8.7–10.3)
CHLORIDE: 100 mmol/L (ref 96–106)
CO2: 24 mmol/L (ref 18–29)
Creatinine, Ser: 1.25 mg/dL — ABNORMAL HIGH (ref 0.57–1.00)
GFR calc Af Amer: 45 mL/min/{1.73_m2} — ABNORMAL LOW (ref >59)
GFR calc non Af Amer: 39 mL/min/{1.73_m2} — ABNORMAL LOW (ref >59)
GLOBULIN, TOTAL: 2.9 g/dL (ref 1.5–4.5)
Glucose: 231 mg/dL — ABNORMAL HIGH (ref 65–99)
Potassium: 4.4 mmol/L (ref 3.5–5.2)
SODIUM: 141 mmol/L (ref 134–144)
Total Protein: 6.7 g/dL (ref 6.0–8.5)

## 2016-03-13 LAB — LIPID PANEL
CHOLESTEROL TOTAL: 213 mg/dL — AB (ref 100–199)
Chol/HDL Ratio: 4.4 ratio units (ref 0.0–4.4)
HDL: 48 mg/dL (ref >39)
LDL CALC: 122 mg/dL — AB (ref 0–99)
TRIGLYCERIDES: 213 mg/dL — AB (ref 0–149)
VLDL Cholesterol Cal: 43 mg/dL — ABNORMAL HIGH (ref 5–40)

## 2016-03-13 LAB — MICROALBUMIN / CREATININE URINE RATIO
Creatinine, Urine: 59.4 mg/dL
MICROALB/CREAT RATIO: 1880.1 mg/g{creat} — AB (ref 0.0–30.0)
MICROALBUM., U, RANDOM: 1116.8 ug/mL

## 2016-03-13 MED ORDER — ALPRAZOLAM 0.25 MG PO TABS: ORAL_TABLET | ORAL | Status: DC

## 2016-03-13 NOTE — Telephone Encounter (Signed)
[name], daughter requested Rx to be faxed to [facility].

## 2016-03-14 ENCOUNTER — Encounter: Payer: Self-pay | Admitting: Cardiology

## 2016-03-14 ENCOUNTER — Telehealth: Payer: Self-pay | Admitting: Cardiovascular Disease

## 2016-03-14 DIAGNOSIS — R42 Dizziness and giddiness: Secondary | ICD-10-CM

## 2016-03-14 NOTE — Telephone Encounter (Signed)
Please schedule for carotid duplex US.

## 2016-03-14 NOTE — Telephone Encounter (Signed)
Returned [employer] call. She is a physical therapist who is working w/ the patient on a number of issues, including continued dizziness. Pt last seen by Dr. Sallyanne Kuster Jan 2017 and has had these problems consistently. Notes pt has been eval'd for BPPV by ENT, is having some continued dizziness. Shelby Dubin notes in working w patient on cervical ROM exercises, her symptoms seem to worsen w/ position of head. She is wondering if feasible for [place] to examine carotid and/or vertebral arteries for problems.  Aware I will defer to Dr. Sallyanne Kuster for recommendations.

## 2016-03-14 NOTE — Telephone Encounter (Signed)
Shelby Dubin is calling to see if Dr. Sallyanne Kuster thinks that Mrs. Orth will need to have a carotid . Please call   Thanks

## 2016-03-17 ENCOUNTER — Ambulatory Visit: Payer: Medicare Other | Admitting: Endocrinology

## 2016-03-17 NOTE — Telephone Encounter (Signed)
Spoke to [facility], informed of recommnedations. Reached out to Kelly Neal, left msg to call. Kelly Neal already aware we may call to set up carotid US per Shaunda.  Sent scheduling a message to arrange w/ patient.

## 2016-03-17 NOTE — Telephone Encounter (Signed)
U/S order placed

## 2016-03-19 ENCOUNTER — Other Ambulatory Visit: Payer: Self-pay | Admitting: Cardiovascular Disease

## 2016-03-19 ENCOUNTER — Ambulatory Visit
Admission: RE | Admit: 2016-03-19 | Discharge: 2016-03-19 | Disposition: A | Discharge status: Home / Self Care | Payer: Medicare Other | Source: Ambulatory Visit | Attending: Internal Medicine | Admitting: Internal Medicine

## 2016-03-19 DIAGNOSIS — R198 Other specified symptoms and signs involving the digestive system and abdomen: Secondary | ICD-10-CM

## 2016-03-19 MED ORDER — FUROSEMIDE 20 MG PO TABS: 20.0000 mg | ORAL_TABLET | Freq: Every day | ORAL | Status: DC

## 2016-03-19 NOTE — Telephone Encounter (Signed)
°*  STAT* If patient is at the pharmacy, call can be transferred to refill team.   1. Which medications need to be refilled? (please list name of each medication and dose if known) Furosemide-needs this asap  2. Which pharmacy/location (including street and city if local pharmacy) is medication to be sent to?[address],[place]  3. Do they need a 30 day or 90 day supply? [place]

## 2016-03-19 NOTE — Telephone Encounter (Signed)
Rx Refill: Furosemide

## 2016-03-24 ENCOUNTER — Other Ambulatory Visit: Payer: Self-pay

## 2016-03-24 MED ORDER — FUROSEMIDE 20 MG PO TABS
20.0000 mg | ORAL_TABLET | Freq: Every day | ORAL | Status: DC
Start: 2016-03-24 — End: 2016-08-08

## 2016-03-24 NOTE — Telephone Encounter (Signed)
Rx Refill

## 2016-03-31 ENCOUNTER — Telehealth: Payer: Self-pay | Admitting: *Deleted

## 2016-03-31 MED ORDER — ACETAMINOPHEN 500 MG PO TABS: ORAL_TABLET | ORAL | Status: DC

## 2016-03-31 NOTE — Telephone Encounter (Signed)
Patient daughter, [name] called and requested if we could increase Patient's Tylenol due to pain. Currently taking 2 tablets twice daily of Tylenol 325mg . Please Advise.

## 2016-03-31 NOTE — Telephone Encounter (Signed)
Ok to change Tylenol to ES 500mg  take 2 tabs po BID for pain

## 2016-03-31 NOTE — Telephone Encounter (Signed)
Patient daughter notified and agreed.  Medication list updated.  

## 2016-04-11 ENCOUNTER — Telehealth: Payer: Self-pay | Admitting: Cardiovascular Disease

## 2016-04-11 NOTE — Telephone Encounter (Signed)
Spoke w/ pt and she agreed to reschedule remote transmission for 05-01-16.

## 2016-04-11 NOTE — Telephone Encounter (Signed)
NEw MEssage  Pt called to speak w/ RN- needs assistance sending remote transmission- had nowshow in April. Please call back and discuss.

## 2016-05-01 ENCOUNTER — Ambulatory Visit (INDEPENDENT_AMBULATORY_CARE_PROVIDER_SITE_OTHER): Payer: Medicare Other | Admitting: *Deleted

## 2016-05-01 DIAGNOSIS — I480 Paroxysmal atrial fibrillation: Secondary | ICD-10-CM

## 2016-05-01 NOTE — Progress Notes (Signed)
Carelink Summary Report / Loop Recorder 

## 2016-05-06 LAB — CUP PACEART REMOTE DEVICE CHECK
Battery Remaining Longevity: 140 mo
Brady Statistic AP VP Percent: 12 %
Brady Statistic AP VS Percent: 1 %
Brady Statistic RA Percent Paced: 12 %
Brady Statistic RV Percent Paced: 99 %
Date Time Interrogation Session: 20170615074335
Implantable Lead Implant Date: 20140604
Implantable Lead Location: 753860
Lead Channel Pacing Threshold Amplitude: 0.75 V
Lead Channel Pacing Threshold Amplitude: 0.75 V
Lead Channel Pacing Threshold Pulse Width: 0.4 ms
Lead Channel Setting Pacing Amplitude: 2 V
MDC IDC LEAD IMPLANT DT: 20140604
MDC IDC LEAD LOCATION: 753859
MDC IDC MSMT BATTERY REMAINING PERCENTAGE: 95.5 %
MDC IDC MSMT BATTERY VOLTAGE: 3.01 V
MDC IDC MSMT LEADCHNL RA IMPEDANCE VALUE: 410 Ohm
MDC IDC MSMT LEADCHNL RA SENSING INTR AMPL: 1.8 mV
MDC IDC MSMT LEADCHNL RV IMPEDANCE VALUE: 510 Ohm
MDC IDC MSMT LEADCHNL RV PACING THRESHOLD PULSEWIDTH: 0.4 ms
MDC IDC PG SERIAL: 7458795
MDC IDC SET LEADCHNL RV PACING AMPLITUDE: 1 V
MDC IDC SET LEADCHNL RV PACING PULSEWIDTH: 0.4 ms
MDC IDC SET LEADCHNL RV SENSING SENSITIVITY: 2 mV
MDC IDC STAT BRADY AS VP PERCENT: 88 %
MDC IDC STAT BRADY AS VS PERCENT: 1 %
Pulse Gen Model: 2210

## 2016-05-07 ENCOUNTER — Encounter: Payer: Self-pay | Admitting: Cardiology

## 2016-05-23 ENCOUNTER — Other Ambulatory Visit: Payer: Self-pay | Admitting: Endocrinology

## 2016-05-26 ENCOUNTER — Telehealth: Payer: Self-pay | Admitting: Cardiovascular Disease

## 2016-05-26 NOTE — Telephone Encounter (Signed)
Prior authorization for Xarelto 15 mg sent to patient's insurance company via covermymeds. Awaiting approval.

## 2016-05-26 NOTE — Telephone Encounter (Signed)
New message      Calling regarding prior auth for xarelto 15mg .  They are needing more info.  Reference number is [id number].  Please call today or case may be denied

## 2016-05-28 NOTE — Telephone Encounter (Signed)
Returned call to patient to let her know that we are waiting on the approval. Will forward to [facility] to check at [email or web address]. Patient requests a call once completed.

## 2016-05-28 NOTE — Telephone Encounter (Signed)
F/u  Pt following up on prio auth- pt requested to speak w/ rN. Please call back and discuss.

## 2016-05-30 NOTE — Telephone Encounter (Signed)
Returned call to patient.I spoke to [employer] Xarelto has been denied.I faxed appeals form to [employer] at fax # [phone] for approval, will take 72 hours with a decision.

## 2016-05-30 NOTE — Telephone Encounter (Signed)
[name] (daughter) is calling because [facility] is supposed to be sending a fax over for prior auth for Xarelto. Please call

## 2016-06-05 ENCOUNTER — Telehealth: Payer: Self-pay | Admitting: Cardiovascular Disease

## 2016-06-05 MED ORDER — RIVAROXABAN 15 MG PO TABS: 15.0000 mg | ORAL_TABLET | Freq: Every day | ORAL | Status: DC

## 2016-06-05 NOTE — Telephone Encounter (Signed)
Returned call to patient.She has not heard if Xarelto approved.  Spoke to [employer] 15 mg has been approved.  Xarelto 15 mg refill sent to [facility].Samples left at [facility] office front desk.

## 2016-06-05 NOTE — Telephone Encounter (Signed)
New message      Calling to see if xarelto appeal was approved

## 2016-06-19 ENCOUNTER — Telehealth: Payer: Self-pay | Admitting: Cardiovascular Disease

## 2016-06-19 NOTE — Telephone Encounter (Signed)
New message     Pt c/o medication issue:  1. Name of Medication: Xarelto  2. How are you currently taking this medication (dosage and times per day)? Uncertain of strength pt take it once daily  3. Are you having a reaction (difficulty breathing--STAT)? Pt having little bleeding  4. What is your medication issue? The pt noticed a little bleeding yesterday and today the pt wants to speak with a nurse

## 2016-06-19 NOTE — Telephone Encounter (Signed)
Spoke to patient. She notes small amt of visible blood in stool 2 days ago - was better the next day and not noticeable today. She had a similar episode a few weeks ago. Concerned bc she is on Xarelto 15mg  daily. I explained NOACs will not cause bleeding, but can contribute to the ease of bleeding/slower clotting time. She notes most recent event coincided w suppository use r/t difficult stool passage. She thinks she possibly had some hemorrhoidal irritation as well. I explained that it was reasonable that all of these things could contribute to rectal bleeding. She denies fatigue, SOB, pain, etc. Suggested as long as no visible bleeding now, or other symptoms, to continue monitoring and get recommendations from PCP or GI. No changes to meds suggested at this time.  Pt agreeable to this.  She also mentions continued dizziness, which we have acknowledged in the past and seems to be continuing despite her having BP meds adjusted, being followed by an ENT to rule out inner ear issues, etc. She notes that she cannot be certain, but thinks onset of dizziness coincided w initiation of Xarelto, and wants to know if it is possible that the Xarelto could be cause or contributor to her symptoms.  Pt aware I will route to provider for recommendations.

## 2016-06-23 NOTE — Telephone Encounter (Signed)
I don't think the Xarelto is responsible for the dizziness. As long as the amount of rectal bleeding is small (as described) and occurs only immediately during/following an otherwise normal BM, should not stop the anticoagulant. Please call if she develops more severe bleeding especially if associated with black/tarry liquid stools or bleeding between BMs.

## 2016-06-25 NOTE — Telephone Encounter (Signed)
Spoke w/ patient and she is aware to call for bleed symptoms. Voiced understanding of instructions.

## 2016-06-26 ENCOUNTER — Telehealth: Payer: Self-pay | Admitting: Cardiovascular Disease

## 2016-06-26 NOTE — Telephone Encounter (Signed)
Advise her to stop it for 5 days then resume.

## 2016-06-26 NOTE — Telephone Encounter (Signed)
Returned call.  She notes she had some lighter rectal bleeding last week, which resolved for a few days. Return of symptoms today. Pt c/o this being worse now - more visible blood when wiping, observance of blood intermixed w/ BM as well.  She was aware to call if her symptoms returned and were worse (see most recent telephone note).  She is on Xarelto and asking if she may hold med. Pt aware I will defer to Dr. Sallyanne Kuster for advice.

## 2016-06-26 NOTE — Telephone Encounter (Signed)
Want to stop Xarelto  due to she is having blood in her stool.

## 2016-06-27 NOTE — Telephone Encounter (Signed)
I spoke with patient and communicated Dr. Victorino December instructions. She voiced understanding and will take 5 days off the Xarelto and then resume. Aware to call if new concerns.

## 2016-07-03 ENCOUNTER — Telehealth: Payer: Self-pay | Admitting: Cardiovascular Disease

## 2016-07-03 NOTE — Telephone Encounter (Signed)
New message      Pt wants Ovid Curd to call her. No info given.

## 2016-07-03 NOTE — Telephone Encounter (Signed)
Returned call to patient. She notified me that her bleeding symptoms have resolved and that she will resume taking the Xarelto. I advised her to call if further concerns or issues. Pt voiced understanding and thanks.

## 2016-07-12 ENCOUNTER — Other Ambulatory Visit: Payer: Self-pay | Admitting: Cardiovascular Disease

## 2016-07-14 NOTE — Telephone Encounter (Signed)
Rx request sent to pharmacy.  

## 2016-08-01 ENCOUNTER — Telehealth: Payer: Self-pay | Admitting: *Deleted

## 2016-08-01 ENCOUNTER — Inpatient Hospital Stay (HOSPITAL_COMMUNITY)
Admission: EM | Admit: 2016-08-01 | Discharge: 2016-08-06 | DRG: 436 | Disposition: A | Discharge status: Home / Self Care | Payer: Medicare Other | Attending: Internal Medicine | Admitting: Internal Medicine

## 2016-08-01 ENCOUNTER — Encounter (HOSPITAL_COMMUNITY): Payer: Self-pay

## 2016-08-01 ENCOUNTER — Emergency Department (HOSPITAL_COMMUNITY): Payer: Medicare Other

## 2016-08-01 DIAGNOSIS — I480 Paroxysmal atrial fibrillation: Secondary | ICD-10-CM | POA: Diagnosis present

## 2016-08-01 DIAGNOSIS — Z809 Family history of malignant neoplasm, unspecified: Secondary | ICD-10-CM

## 2016-08-01 DIAGNOSIS — Z886 Allergy status to analgesic agent status: Secondary | ICD-10-CM

## 2016-08-01 DIAGNOSIS — L299 Pruritus, unspecified: Secondary | ICD-10-CM | POA: Diagnosis present

## 2016-08-01 DIAGNOSIS — Z7189 Other specified counseling: Secondary | ICD-10-CM

## 2016-08-01 DIAGNOSIS — R140 Abdominal distension (gaseous): Secondary | ICD-10-CM | POA: Diagnosis present

## 2016-08-01 DIAGNOSIS — E861 Hypovolemia: Secondary | ICD-10-CM | POA: Diagnosis present

## 2016-08-01 DIAGNOSIS — E860 Dehydration: Secondary | ICD-10-CM | POA: Diagnosis present

## 2016-08-01 DIAGNOSIS — C221 Intrahepatic bile duct carcinoma: Secondary | ICD-10-CM | POA: Diagnosis not present

## 2016-08-01 DIAGNOSIS — E1122 Type 2 diabetes mellitus with diabetic chronic kidney disease: Secondary | ICD-10-CM | POA: Diagnosis present

## 2016-08-01 DIAGNOSIS — I5042 Chronic combined systolic (congestive) and diastolic (congestive) heart failure: Secondary | ICD-10-CM | POA: Diagnosis present

## 2016-08-01 DIAGNOSIS — E876 Hypokalemia: Secondary | ICD-10-CM | POA: Diagnosis present

## 2016-08-01 DIAGNOSIS — Z66 Do not resuscitate: Secondary | ICD-10-CM | POA: Diagnosis present

## 2016-08-01 DIAGNOSIS — K761 Chronic passive congestion of liver: Secondary | ICD-10-CM | POA: Diagnosis present

## 2016-08-01 DIAGNOSIS — Z8249 Family history of ischemic heart disease and other diseases of the circulatory system: Secondary | ICD-10-CM

## 2016-08-01 DIAGNOSIS — Z9849 Cataract extraction status, unspecified eye: Secondary | ICD-10-CM

## 2016-08-01 DIAGNOSIS — R160 Hepatomegaly, not elsewhere classified: Secondary | ICD-10-CM | POA: Diagnosis not present

## 2016-08-01 DIAGNOSIS — Z950 Presence of cardiac pacemaker: Secondary | ICD-10-CM | POA: Diagnosis present

## 2016-08-01 DIAGNOSIS — Z9889 Other specified postprocedural states: Secondary | ICD-10-CM

## 2016-08-01 DIAGNOSIS — N184 Chronic kidney disease, stage 4 (severe): Secondary | ICD-10-CM | POA: Diagnosis present

## 2016-08-01 DIAGNOSIS — I10 Essential (primary) hypertension: Secondary | ICD-10-CM | POA: Diagnosis present

## 2016-08-01 DIAGNOSIS — R42 Dizziness and giddiness: Secondary | ICD-10-CM

## 2016-08-01 DIAGNOSIS — Z794 Long term (current) use of insulin: Secondary | ICD-10-CM

## 2016-08-01 DIAGNOSIS — R1084 Generalized abdominal pain: Secondary | ICD-10-CM

## 2016-08-01 DIAGNOSIS — I130 Hypertensive heart and chronic kidney disease with heart failure and stage 1 through stage 4 chronic kidney disease, or unspecified chronic kidney disease: Secondary | ICD-10-CM | POA: Diagnosis present

## 2016-08-01 DIAGNOSIS — Z515 Encounter for palliative care: Secondary | ICD-10-CM

## 2016-08-01 DIAGNOSIS — Z87891 Personal history of nicotine dependence: Secondary | ICD-10-CM

## 2016-08-01 DIAGNOSIS — Z79899 Other long term (current) drug therapy: Secondary | ICD-10-CM

## 2016-08-01 DIAGNOSIS — K5900 Constipation, unspecified: Secondary | ICD-10-CM | POA: Diagnosis present

## 2016-08-01 LAB — COMPREHENSIVE METABOLIC PANEL
ALT: 203 U/L — AB (ref 14–54)
AST: 106 U/L — AB (ref 15–41)
Albumin: 3.1 g/dL — ABNORMAL LOW (ref 3.5–5.0)
Alkaline Phosphatase: 390 U/L — ABNORMAL HIGH (ref 38–126)
Anion gap: 11 (ref 5–15)
BUN: 21 mg/dL — AB (ref 6–20)
CHLORIDE: 99 mmol/L — AB (ref 101–111)
CO2: 24 mmol/L (ref 22–32)
CREATININE: 1.22 mg/dL — AB (ref 0.44–1.00)
Calcium: 9.2 mg/dL (ref 8.9–10.3)
GFR calc non Af Amer: 39 mL/min — ABNORMAL LOW (ref >60)
GFR, EST AFRICAN AMERICAN: 45 mL/min — AB (ref >60)
Glucose, Bld: 176 mg/dL — ABNORMAL HIGH (ref 65–99)
POTASSIUM: 3.5 mmol/L (ref 3.5–5.1)
SODIUM: 134 mmol/L — AB (ref 135–145)
Total Bilirubin: 6.3 mg/dL — ABNORMAL HIGH (ref 0.3–1.2)
Total Protein: 6.7 g/dL (ref 6.5–8.1)

## 2016-08-01 LAB — URINALYSIS, ROUTINE W REFLEX MICROSCOPIC
GLUCOSE, UA: NEGATIVE mg/dL
Ketones, ur: NEGATIVE mg/dL
Nitrite: NEGATIVE
PH: 5.5 (ref 5.0–8.0)
PROTEIN: 100 mg/dL — AB
Specific Gravity, Urine: 1.011 (ref 1.005–1.030)

## 2016-08-01 LAB — CBC
HEMATOCRIT: 38.4 % (ref 36.0–46.0)
Hemoglobin: 12.6 g/dL (ref 12.0–15.0)
MCH: 29.4 pg (ref 26.0–34.0)
MCHC: 32.8 g/dL (ref 30.0–36.0)
MCV: 89.7 fL (ref 78.0–100.0)
PLATELETS: 316 10*3/uL (ref 150–400)
RBC: 4.28 MIL/uL (ref 3.87–5.11)
RDW: 13.5 % (ref 11.5–15.5)
WBC: 10.6 10*3/uL — ABNORMAL HIGH (ref 4.0–10.5)

## 2016-08-01 LAB — URINE MICROSCOPIC-ADD ON

## 2016-08-01 LAB — LIPASE, BLOOD: LIPASE: 39 U/L (ref 11–51)

## 2016-08-01 LAB — I-STAT CG4 LACTIC ACID, ED: Lactic Acid, Venous: 0.74 mmol/L (ref 0.5–1.9)

## 2016-08-01 MED ORDER — IOPAMIDOL (ISOVUE-300) INJECTION 61%
INTRAVENOUS | Status: AC
  Administered 2016-08-01: 80 mL
  Filled 2016-08-01: qty 100

## 2016-08-01 MED ORDER — ONDANSETRON HCL 4 MG/2ML IJ SOLN
4.0000 mg | Freq: Once | INTRAMUSCULAR | Status: DC
Start: 2016-08-01 — End: 2016-08-02

## 2016-08-01 MED ORDER — SODIUM CHLORIDE 0.9 % IV BOLUS (SEPSIS)
500.0000 mL | Freq: Once | INTRAVENOUS | Status: AC
  Administered 2016-08-01: 500 mL via INTRAVENOUS

## 2016-08-01 NOTE — ED Notes (Signed)
PT HAS FREQUENT URINATION

## 2016-08-01 NOTE — Telephone Encounter (Signed)
noted 

## 2016-08-01 NOTE — ED Provider Notes (Signed)
[facility] DEPT Provider Note   CSN: [id number] Arrival date & time: 08/01/16  1712     History   Chief Complaint Chief Complaint  Patient presents with  . Abdominal Pain  . Dizziness    HPI Kelly Neal is a 82 y.o. female.  HPI  Past Medical History:  Diagnosis Date  . Chronic combined systolic and diastolic CHF (congestive heart failure) ([place])    a. 02/2015 Echo: EF 40%, mod LVH, Ao sclerosis w/o stenosis, triv MR.  . CKD (chronic kidney disease), stage III   . DM type 2 (diabetes mellitus, type 2) ([place])   . Essential hypertension   . Hyperlipidemia   . Paroxysmal atrial fibrillation ([place])    a. Dx 08/2014;  b. CHA2DS2VASc = 6-->Xarelto started 02/2015.  . Tachy-brady syndrome ([place])    a. 04/2013 s/p SJM 2210 Accent DC PPM, Ser#: [id number].    Patient Active Problem List   Diagnosis Date Noted  . Liver mass 08/02/2016  . Dehydration 08/02/2016  . Constipation 08/02/2016  . Diabetes ([place]) 03/06/2016  . Long term (current) use of anticoagulants 12/13/2015  . Essential hypertension   . CKD (chronic kidney disease) stage 4, GFR 15-29 ml/min ([facility]) 03/09/2015  . Peripheral edema   . Proteinuria   . Chronic combined systolic and diastolic CHF (congestive heart failure) ([place]) 03/03/2015  . Paroxysmal atrial fibrillation ([place]) 09/05/2014  . Dizzy spells 05/26/2013  . Cardiac pacemaker in situ-St Jude implanted 04/20/13 04/21/2013  . Second degree atrioventricular block, Mobitz (type) I 04/19/2013  . Hyperlipidemia     Past Surgical History:  Procedure Laterality Date  . CATARACT EXTRACTION    . EYE SURGERY    . PERMANENT PACEMAKER INSERTION N/A 04/20/2013   Procedure: PERMANENT PACEMAKER INSERTION;  Surgeon: Evans Lance, MD;  Location: [facility] CATH LAB;  Service: Cardiovascular;  Laterality: N/A;    OB History    No data available       Home Medications    Prior to Admission medications   Medication Sig Start Date End Date Taking? Authorizing Provider    ALPRAZolam (XANAX) 0.25 MG tablet Take 1/2 tablet in AM and at midday as needed for anxiety; take 1 tablet at bedtime for sleep. 03/13/16  Yes Tiffany L Reed, DO  amLODipine (NORVASC) 5 MG tablet Take 1 tablet (5 mg total) by mouth daily. Please schedule appointment for refills. 07/14/16  Yes Mihai Croitoru, MD  B-D ULTRAFINE III SHORT PEN 31G X 8 MM MISC Use to inject insulin 1 time per day 08/14/15  Yes Renato Shin, MD  Blood Glucose Monitoring Suppl (BAYER CONTOUR NEXT MONITOR) W/DEVICE KIT Use to check blood sugar 2 times per day 03/30/15  Yes Renato Shin, MD  furosemide (LASIX) 20 MG tablet Take 1 tablet (20 mg total) by mouth daily. 03/24/16  Yes Mihai Croitoru, MD  glucose blood (BAYER CONTOUR NEXT TEST) test strip 1 each by Other route 2 (two) times daily. And lancets 2/day. Dx: E11.22 01/28/16  Yes Renato Shin, MD  Insulin Detemir (LEVEMIR FLEXTOUCH) 100 UNIT/ML Pen Inject 12 Units into the skin every morning. And pen needles 1/day 03/05/16  Yes Renato Shin, MD  Rivaroxaban (XARELTO) 15 MG TABS tablet Take 1 tablet (15 mg total) by mouth daily. 06/05/16  Yes Sanda Klein, MD    Family History Family History  Problem Relation Age of Onset  . Hypertension Mother   . Heart attack Father   . Cancer Brother   . Diabetes Neg Hx  Social History Social History  Substance Use Topics  . Smoking status: Former Smoker    Years: 2.00    Types: Cigarettes    Quit date: 11/17/1950  . Smokeless tobacco: Former Systems developer  . Alcohol use No     Allergies   Tylenol [acetaminophen]   Review of Systems Review of Systems  Constitutional: Positive for appetite change. Negative for chills and fever.  Respiratory: Negative for chest tightness and shortness of breath.   Cardiovascular: Negative for chest pain.  Gastrointestinal: Positive for abdominal distention and diarrhea. Negative for abdominal pain, anal bleeding, blood in stool, nausea and vomiting.  Genitourinary: Negative for dysuria,  vaginal bleeding and vaginal discharge.  Skin: Positive for color change.  All other systems reviewed and are negative.    Physical Exam Updated Vital Signs BP 171/69   Pulse 71   Temp 97.7 F (36.5 C) (Oral)   Resp 20   Ht 5' 7.5" (1.715 m)   Wt 61.7 kg   SpO2 97%   BMI 20.99 kg/m   Physical Exam  Constitutional: She appears well-developed and well-nourished. No distress.  HENT:  Head: Normocephalic and atraumatic.  Eyes: Conjunctivae are normal.  Neck: Neck supple.  Cardiovascular: Normal rate and regular rhythm.   No murmur heard. Pulmonary/Chest: Effort normal and breath sounds normal. No respiratory distress.  Abdominal: She exhibits distension. She exhibits no mass. There is tenderness. There is no guarding.  Musculoskeletal: She exhibits no edema.  Neurological: She is alert.  Skin: Skin is warm and dry.  Jaundiced  Psychiatric: She has a normal mood and affect.  Nursing note and vitals reviewed.    ED Treatments / Results  Labs (all labs ordered are listed, but only abnormal results are displayed) Labs Reviewed  COMPREHENSIVE METABOLIC PANEL - Abnormal; Notable for the following:       Result Value   Sodium 134 (*)    Chloride 99 (*)    Glucose, Bld 176 (*)    BUN 21 (*)    Creatinine, Ser 1.22 (*)    Albumin 3.1 (*)    AST 106 (*)    ALT 203 (*)    Alkaline Phosphatase 390 (*)    Total Bilirubin 6.3 (*)    GFR calc non Af Amer 39 (*)    GFR calc Af Amer 45 (*)    All other components within normal limits  CBC - Abnormal; Notable for the following:    WBC 10.6 (*)    All other components within normal limits  URINALYSIS, ROUTINE W REFLEX MICROSCOPIC (NOT AT [facility]) - Abnormal; Notable for the following:    Color, Urine AMBER (*)    APPearance CLOUDY (*)    Hgb urine dipstick SMALL (*)    Bilirubin Urine MODERATE (*)    Protein, ur 100 (*)    Leukocytes, UA TRACE (*)    All other components within normal limits  URINE MICROSCOPIC-ADD ON -  Abnormal; Notable for the following:    Squamous Epithelial / LPF 0-5 (*)    Bacteria, UA FEW (*)    Casts HYALINE CASTS (*)    All other components within normal limits  LIPASE, BLOOD  I-STAT CG4 LACTIC ACID, ED  I-STAT CG4 LACTIC ACID, ED    EKG  EKG Interpretation  Date/Time:  Friday August 01 2016 17:39:31 EDT Ventricular Rate:  78 PR Interval:  182 QRS Duration: 180 QT Interval:  458 QTC Calculation: 522 R Axis:   -80 Text Interpretation:  Atrial-sensed ventricular-paced rhythm Abnormal ECG No significant change since last tracing Confirmed by YAO  MD, DAVID ([id number]) on 08/01/2016 6:54:10 PM       Radiology Ct Abdomen Pelvis W Contrast  Result Date: 08/01/2016 CLINICAL DATA:  Acute onset of generalized abdominal pain, elevated LFTs and increased urinary frequency. EXAM: CT ABDOMEN AND PELVIS WITH CONTRAST TECHNIQUE: Multidetector CT imaging of the abdomen and pelvis was performed using the standard protocol following bolus administration of intravenous contrast. CONTRAST:  83m ISOVUE-300 IOPAMIDOL (ISOVUE-300) INJECTION 61% COMPARISON:  Abdominal ultrasound performed 03/19/2016 FINDINGS: Lower chest: Minimal bibasilar atelectasis is noted. Pacemaker leads are partially imaged. Hepatobiliary: There is a prominent hypoattenuating mass at the central right hepatic lobe, measuring approximately 5.3 x 3.8 x 4.6 cm. There is some degree of displacement of surrounding structures, with diffuse dilatation of intrahepatic biliary ducts. Additional vague hypoattenuation is noted within the central right hepatic lobe. This is concerning for malignancy; a hepatic abscess is considered much less likely, as no well defined fluid is seen. The common bile duct remains normal in caliber. Pancreas: A 9 mm cystic focus is noted at the pancreatic tail. The pancreas is otherwise unremarkable. Spleen: The spleen is unremarkable in appearance. Adrenals/Urinary Tract: The adrenal glands are unremarkable in  appearance. The kidneys are within normal limits. There is no evidence of hydronephrosis. No renal or ureteral stones are identified. Mild nonspecific perinephric stranding is noted bilaterally. Stomach/Bowel: The appendix is not definitely characterized; there is no evidence of appendicitis. Minimal diverticulosis is noted along the ascending and sigmoid colon, without evidence of diverticulitis. A small umbilical hernia is noted, containing only fat. The stomach is decompressed and grossly unremarkable. The small bowel is within normal limits. Vascular/Lymphatic: Scattered calcification is seen along the abdominal aorta and its branches. The abdominal aorta is otherwise grossly unremarkable. The inferior vena cava is grossly unremarkable. No retroperitoneal lymphadenopathy is seen. No pelvic sidewall lymphadenopathy is identified. Reproductive: The bladder is moderately distended and within normal limits. The uterus is grossly unremarkable in appearance. The ovaries are relatively symmetric. No suspicious adnexal masses are seen. Other: No additional soft tissue abnormalities are seen. Musculoskeletal: No acute osseous abnormalities are identified. The visualized musculature is unremarkable in appearance. There is mild chronic loss of height at vertebral body L2. IMPRESSION: 1. Prominent hypoattenuating mass at the central right hepatic lobe, measuring 5.3 x 3.8 x 4.6 cm. Some degree of displacement of surrounding structures, with diffuse dilatation of intrahepatic biliary ducts. Additional vague hypoattenuation noted within the central right hepatic lobe. This is concerning for primary hepatic malignancy or cholangiocarcinoma; a hepatic abscess is considered much less likely, as no well defined fluid is seen. 2. **An incidental finding of potential clinical significance has been found. 9 mm cystic focus at the pancreatic tail. As before, follow-up MRCP would be helpful in 8 months to assess stability.** 3.  Minimal diverticulosis along the ascending and sigmoid colon, without evidence of diverticulitis. 4. Small umbilical hernia, containing only fat. 5. Mild chronic loss of height at vertebral body L2. Electronically Signed   By: JGarald BaldingM.D.   On: 08/01/2016 23:30    Procedures Procedures (including critical care time)  Medications Ordered in ED Medications  amLODipine (NORVASC) tablet 5 mg (not administered)  hydrALAZINE (APRESOLINE) injection 5 mg (not administered)  0.9 %  sodium chloride infusion (not administered)  ondansetron (ZOFRAN) tablet 4 mg (not administered)    Or  ondansetron (ZOFRAN) injection 4 mg (not administered)  zolpidem (AMBIEN) tablet 5  mg (not administered)  sodium chloride 0.9 % bolus 500 mL (0 mLs Intravenous Stopped 08/02/16 0029)  iopamidol (ISOVUE-300) 61 % injection (80 mLs  Contrast Given 08/01/16 2045)     Initial Impression / Assessment and Plan / ED Course  I have reviewed the triage vital signs and the nursing notes.  Pertinent labs & imaging results that were available during my care of the patient were reviewed by me and considered in my medical decision making (see chart for details).  Clinical Course    Pt is a 82yo female with a PMHx of paroxysmal a fib, CHF, HTN who comes in today complaining of bloating and yellowing of her skin. Pt states these symptoms started approx 5 days ago and have been constant and worsening. Pt states her abdomen feels "full and bloated." Pt denies alleviating or aggrivating factors. Pt denies fever, chills, pain, or SOB.   Physical exam: Pt CTAB, heart sounds normal. Abdomen is distended w/ positive fluid wave. Pt has tenderness to deep palpation in the RUQ w/o rebound or guarding. Unable to appreciate liver edge. Pt is diffusely jaundiced. Remainder of exam WNL.  Will collect CBC, CMP, Ua, Lipase, lactate and CT of abdomen.  Pt's urinalysis w/ evidence of UTI. Pt w/ elevated LFTs, alk phos and T billi.  CT  showed liver masses concerning for malignancy. See report for details.   Will admit Pt to Hospitalist for further workup and observation.  Final Clinical Impressions(s) / ED Diagnoses   Final diagnoses:  None    New Prescriptions New Prescriptions   No medications on file     Chapman Moss, MD 08/02/16 0141    Drenda Freeze, MD 08/20/16 [phone]

## 2016-08-01 NOTE — ED Triage Notes (Signed)
Pt here with her daughter, she reports dizziness, nausea, abd distention and constipation. Pt appears very jaundice. Abd distention noted. Pt reports hx of dizziness but it has intensified.

## 2016-08-01 NOTE — Telephone Encounter (Signed)
Daughter, [name] called and stated that patient is Impacted and doesn't know when her last BM was. Patient states she Feels like she is impacted and wants to vomit but can't. Distended abdomen, and yellow color skin and very tired and unsteady gait. Stated that she is running a fever but unsure how much.   I advised her to go ahead and take her mother to the ER to be evaluated. She agreed.

## 2016-08-02 ENCOUNTER — Encounter (HOSPITAL_COMMUNITY): Payer: Self-pay | Admitting: Family Medicine

## 2016-08-02 DIAGNOSIS — N184 Chronic kidney disease, stage 4 (severe): Secondary | ICD-10-CM | POA: Diagnosis not present

## 2016-08-02 DIAGNOSIS — R160 Hepatomegaly, not elsewhere classified: Secondary | ICD-10-CM | POA: Diagnosis present

## 2016-08-02 DIAGNOSIS — I5042 Chronic combined systolic (congestive) and diastolic (congestive) heart failure: Secondary | ICD-10-CM | POA: Diagnosis not present

## 2016-08-02 DIAGNOSIS — E860 Dehydration: Secondary | ICD-10-CM | POA: Diagnosis present

## 2016-08-02 DIAGNOSIS — K5900 Constipation, unspecified: Secondary | ICD-10-CM

## 2016-08-02 DIAGNOSIS — R42 Dizziness and giddiness: Secondary | ICD-10-CM

## 2016-08-02 DIAGNOSIS — I10 Essential (primary) hypertension: Secondary | ICD-10-CM

## 2016-08-02 DIAGNOSIS — Z950 Presence of cardiac pacemaker: Secondary | ICD-10-CM

## 2016-08-02 DIAGNOSIS — I480 Paroxysmal atrial fibrillation: Secondary | ICD-10-CM

## 2016-08-02 LAB — COMPREHENSIVE METABOLIC PANEL
ALK PHOS: 355 U/L — AB (ref 38–126)
ALT: 159 U/L — AB (ref 14–54)
AST: 85 U/L — AB (ref 15–41)
Albumin: 2.6 g/dL — ABNORMAL LOW (ref 3.5–5.0)
Anion gap: 11 (ref 5–15)
BILIRUBIN TOTAL: 5.5 mg/dL — AB (ref 0.3–1.2)
BUN: 19 mg/dL (ref 6–20)
CALCIUM: 8.7 mg/dL — AB (ref 8.9–10.3)
CHLORIDE: 101 mmol/L (ref 101–111)
CO2: 25 mmol/L (ref 22–32)
CREATININE: 1.14 mg/dL — AB (ref 0.44–1.00)
GFR, EST AFRICAN AMERICAN: 49 mL/min — AB (ref >60)
GFR, EST NON AFRICAN AMERICAN: 43 mL/min — AB (ref >60)
Glucose, Bld: 148 mg/dL — ABNORMAL HIGH (ref 65–99)
Potassium: 3.2 mmol/L — ABNORMAL LOW (ref 3.5–5.1)
Sodium: 137 mmol/L (ref 135–145)
TOTAL PROTEIN: 5.5 g/dL — AB (ref 6.5–8.1)

## 2016-08-02 LAB — CBG MONITORING, ED: Glucose-Capillary: 65 mg/dL (ref 65–99)

## 2016-08-02 LAB — GLUCOSE, CAPILLARY
GLUCOSE-CAPILLARY: 167 mg/dL — AB (ref 65–99)
GLUCOSE-CAPILLARY: 160 mg/dL — AB (ref 65–99)
Glucose-Capillary: 95 mg/dL (ref 65–99)

## 2016-08-02 MED ORDER — AMLODIPINE BESYLATE 5 MG PO TABS
5.0000 mg | ORAL_TABLET | Freq: Every day | ORAL | Status: DC
  Administered 2016-08-02 – 2016-08-06 (×5): 5 mg via ORAL
  Filled 2016-08-02 (×6): qty 1

## 2016-08-02 MED ORDER — POTASSIUM CHLORIDE CRYS ER 20 MEQ PO TBCR
40.0000 meq | EXTENDED_RELEASE_TABLET | ORAL | Status: AC
  Administered 2016-08-02 (×2): 40 meq via ORAL
  Filled 2016-08-02 (×2): qty 2

## 2016-08-02 MED ORDER — POLYETHYLENE GLYCOL 3350 17 G PO PACK
17.0000 g | PACK | Freq: Two times a day (BID) | ORAL | Status: DC
  Administered 2016-08-02 – 2016-08-06 (×7): 17 g via ORAL
  Filled 2016-08-02 (×9): qty 1

## 2016-08-02 MED ORDER — SENNA 8.6 MG PO TABS
1.0000 | ORAL_TABLET | Freq: Two times a day (BID) | ORAL | Status: DC
  Administered 2016-08-02 – 2016-08-05 (×7): 8.6 mg via ORAL
  Filled 2016-08-02 (×7): qty 1

## 2016-08-02 MED ORDER — ACETAMINOPHEN 325 MG PO TABS: 650.0000 mg | ORAL_TABLET | Freq: Four times a day (QID) | ORAL | Status: DC | PRN

## 2016-08-02 MED ORDER — ACETAMINOPHEN 650 MG RE SUPP: 650.0000 mg | Freq: Four times a day (QID) | RECTAL | Status: DC | PRN

## 2016-08-02 MED ORDER — BISACODYL 10 MG RE SUPP
10.0000 mg | Freq: Once | RECTAL | Status: AC
  Administered 2016-08-02: 10 mg via RECTAL
  Filled 2016-08-02: qty 1

## 2016-08-02 MED ORDER — HYDRALAZINE HCL 20 MG/ML IJ SOLN
5.0000 mg | Freq: Four times a day (QID) | INTRAMUSCULAR | Status: DC | PRN
  Administered 2016-08-05 – 2016-08-06 (×2): 5 mg via INTRAVENOUS
  Filled 2016-08-02 (×2): qty 1

## 2016-08-02 MED ORDER — ALPRAZOLAM 0.25 MG PO TABS
0.1250 mg | ORAL_TABLET | Freq: Two times a day (BID) | ORAL | Status: DC | PRN
  Administered 2016-08-02 – 2016-08-05 (×5): 0.25 mg via ORAL
  Filled 2016-08-02 (×7): qty 1

## 2016-08-02 MED ORDER — ZOLPIDEM TARTRATE 5 MG PO TABS
5.0000 mg | ORAL_TABLET | Freq: Every evening | ORAL | Status: DC | PRN
  Administered 2016-08-03 – 2016-08-05 (×3): 5 mg via ORAL
  Filled 2016-08-02 (×3): qty 1

## 2016-08-02 MED ORDER — INSULIN ASPART 100 UNIT/ML ~~LOC~~ SOLN: 0.0000 [IU] | Freq: Every day | SUBCUTANEOUS | Status: DC

## 2016-08-02 MED ORDER — INSULIN ASPART 100 UNIT/ML ~~LOC~~ SOLN
0.0000 [IU] | Freq: Three times a day (TID) | SUBCUTANEOUS | Status: DC
  Administered 2016-08-02: 2 [IU] via SUBCUTANEOUS
  Administered 2016-08-02: 1 [IU] via SUBCUTANEOUS
  Administered 2016-08-02 – 2016-08-03 (×2): 2 [IU] via SUBCUTANEOUS
  Administered 2016-08-04: 1 [IU] via SUBCUTANEOUS
  Administered 2016-08-05: 2 [IU] via SUBCUTANEOUS
  Administered 2016-08-05: 3 [IU] via SUBCUTANEOUS
  Administered 2016-08-05: 1 [IU] via SUBCUTANEOUS
  Administered 2016-08-06 (×2): 2 [IU] via SUBCUTANEOUS

## 2016-08-02 MED ORDER — INSULIN DETEMIR 100 UNIT/ML ~~LOC~~ SOLN
6.0000 [IU] | Freq: Every day | SUBCUTANEOUS | Status: DC
  Administered 2016-08-02 – 2016-08-06 (×5): 6 [IU] via SUBCUTANEOUS
  Filled 2016-08-02 (×5): qty 0.06

## 2016-08-02 MED ORDER — INSULIN DETEMIR 100 UNIT/ML FLEXPEN: 6.0000 [IU] | PEN_INJECTOR | SUBCUTANEOUS | Status: DC

## 2016-08-02 MED ORDER — RIVAROXABAN 15 MG PO TABS
15.0000 mg | ORAL_TABLET | Freq: Every day | ORAL | Status: DC
  Administered 2016-08-02 – 2016-08-03 (×2): 15 mg via ORAL
  Filled 2016-08-02 (×2): qty 1

## 2016-08-02 MED ORDER — OXYCODONE HCL 5 MG PO TABS
2.5000 mg | ORAL_TABLET | ORAL | Status: DC | PRN
  Administered 2016-08-02 – 2016-08-05 (×11): 5 mg via ORAL
  Filled 2016-08-02 (×11): qty 1

## 2016-08-02 MED ORDER — DOCUSATE SODIUM 100 MG PO CAPS
100.0000 mg | ORAL_CAPSULE | Freq: Two times a day (BID) | ORAL | Status: DC
  Administered 2016-08-02 – 2016-08-06 (×8): 100 mg via ORAL
  Filled 2016-08-02 (×9): qty 1

## 2016-08-02 MED ORDER — ONDANSETRON HCL 4 MG/2ML IJ SOLN: 4.0000 mg | Freq: Four times a day (QID) | INTRAMUSCULAR | Status: DC | PRN

## 2016-08-02 MED ORDER — SODIUM CHLORIDE 0.9 % IV SOLN
INTRAVENOUS | Status: AC
  Administered 2016-08-02: 03:00:00 via INTRAVENOUS

## 2016-08-02 MED ORDER — ONDANSETRON HCL 4 MG PO TABS: 4.0000 mg | ORAL_TABLET | Freq: Four times a day (QID) | ORAL | Status: DC | PRN

## 2016-08-02 NOTE — ED Notes (Signed)
Patient was given a [place] Sandwich with a diet Coke. Pt. CBG was 65.

## 2016-08-02 NOTE — ED Notes (Signed)
The pt lives with her sone and for  A week or longer she has had abd pain blosting and she cannot remember when she last had a bm  She has also had dizziness.  Her daughter with her reports that she has been weaker than normal  She usually gets around on her own but has needed more help than usual.  Alert oriented jaundiced skin warmn and dry

## 2016-08-02 NOTE — ED Notes (Signed)
The pt arrived with daughter with abd pain for at least a  Week  sje is also c/o bloating in her abd with dizziness intermittently and she cannot remember when she last had a bm  Alert orineted skin warm and dry.  Her duaghter reports that she  Has been weaker and her skin is yellow  The pt has a pacemaker and at present is pacing every beat.

## 2016-08-02 NOTE — H&P (Signed)
History and Physical  Patient Name: Kelly Neal     HYI:[id number]    DOB: December 07, 1927    DOA: 08/01/2016 PCP: Gildardo Cranker, DO   Patient coming from: Home  Chief Complaint: Decreased appetite, weakness, jaundice  HPI: Kelly Neal is a 82 y.o. female with a past medical history significant for CHF EF 40%, CKD IV, pacer, pAF on rivaroxaban, and IDDM who presents with painless jaundice and abdominal distension.  The patient presents with 1-2 weeks decreased appetite, nausea, constipation, abdominal distention, vague abdominal discomfort, and new jaundice.  She has tried OTC suppository without relief of constipation.  This progressed until now for several days she has had barely anything to eat, and is afraid to fall she is so weak and dizzy, her daughter brought her to the ER.  ED course: -Afebrile, heart rate 80s, respirations 14, hypertensive to 195/90, pulse oximetry normal -Na 134, K 3.5, Cr 1.2 (baseline 1.2-1.5), WBC 10.6 K, Hgb 12.6 -AST and ALT were 106, 23, respectively. Total bilirubin 6.3, new from April. lipase normal -Urinalysis showed no pyuria or hematuria Lactate was normal -CT of the abdomen and pelvis with contrast showed a new 5 cm hepatic mass, suspicious for malignancy (primary HCC versus cholangiocarcinoma), with dilated ducts; small pancreatic cysts known from right upper quadrant ultrasound in May were still present -TRH were asked to observe overnight given weakness/dehydration and frailty          ROS: Review of Systems  Constitutional: Positive for malaise/fatigue (and dry mouth) and weight loss. Negative for chills and fever.  Respiratory: Negative for cough, sputum production and shortness of breath.   Cardiovascular: Negative for chest pain, palpitations and orthopnea.  Gastrointestinal: Positive for constipation and nausea (and bloating). Negative for abdominal pain, blood in stool, melena and vomiting.  Neurological: Positive for dizziness  (chronic, worse this week), weakness and headaches (chronic, in face, "electric-like", since pacer placement). Negative for loss of consciousness.  Psychiatric/Behavioral: The patient has insomnia (because of discomfort).   All other systems reviewed and are negative.         Past Medical History:  Diagnosis Date  . Chronic combined systolic and diastolic CHF (congestive heart failure) ([place])    a. 02/2015 Echo: EF 40%, mod LVH, Ao sclerosis w/o stenosis, triv MR.  . CKD (chronic kidney disease), stage III   . DM type 2 (diabetes mellitus, type 2) ([place])   . Essential hypertension   . Hyperlipidemia   . Paroxysmal atrial fibrillation ([place])    a. Dx 08/2014;  b. CHA2DS2VASc = 6-->Xarelto started 02/2015.  . Tachy-brady syndrome ([place])    a. 04/2013 s/p SJM 2210 [facility] DC PPM, Ser#: [id number].    Past Surgical History:  Procedure Laterality Date  . CATARACT EXTRACTION    . EYE SURGERY    . PERMANENT PACEMAKER INSERTION N/A 04/20/2013   Procedure: PERMANENT PACEMAKER INSERTION;  Surgeon: Evans Lance, MD;  Location: [facility] CATH LAB;  Service: Cardiovascular;  Laterality: N/A;    Social History: Patient lives with her adult son who has high-functioning autism and is partially her caregiver.  The patient walks with a walker the last two years.  She is not a smoker. 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  No alcohol.  Allergies  Allergen Reactions  . Tylenol [Acetaminophen] Other (See Comments)    MD thinks its causing internal bleeding     Family history: family  history includes Cancer in her brother; Heart attack in her father; Hypertension in her mother.  Prior to Admission medications   Medication Sig Start Date End Date Taking? Authorizing Provider  ALPRAZolam (XANAX) 0.25 MG tablet Take 1/2 tablet in AM and at midday as needed for anxiety; take 1 tablet at bedtime for sleep. 03/13/16  Yes Tiffany L Reed,  DO  amLODipine (NORVASC) 5 MG tablet Take 1 tablet (5 mg total) by mouth daily. Please schedule appointment for refills. 07/14/16  Yes Mihai Croitoru, MD  B-D ULTRAFINE III SHORT PEN 31G X 8 MM MISC Use to inject insulin 1 time per day 08/14/15  Yes Renato Shin, MD  Blood Glucose Monitoring Suppl (BAYER CONTOUR NEXT MONITOR) W/DEVICE KIT Use to check blood sugar 2 times per day 03/30/15  Yes Renato Shin, MD  furosemide (LASIX) 20 MG tablet Take 1 tablet (20 mg total) by mouth daily. 03/24/16  Yes Mihai Croitoru, MD  glucose blood (BAYER CONTOUR NEXT TEST) test strip 1 each by Other route 2 (two) times daily. And lancets 2/day. Dx: E11.22 01/28/16  Yes Renato Shin, MD  Insulin Detemir (LEVEMIR FLEXTOUCH) 100 UNIT/ML Pen Inject 12 Units into the skin every morning. And pen needles 1/day 03/05/16  Yes Renato Shin, MD  Rivaroxaban (XARELTO) 15 MG TABS tablet Take 1 tablet (15 mg total) by mouth daily. 06/05/16  Yes Mihai Croitoru, MD       Physical Exam: BP 188/84   Pulse 81   Temp 97.7 F (36.5 C) (Oral)   Resp 20   Ht 5' 7.5" (1.715 m)   Wt 61.7 kg (136 lb)   SpO2 95%   BMI 20.99 kg/m  General appearance: Thin frail elderly female, alert and in no acute distress, but very weak.   Eyes: Anicteric, conjunctiva pink, but dry and with some thick whitish discharge, lids and lashes normal. PERRL.    ENT: No nasal deformity, discharge, epistaxis.  Hearing normal. OP dry without lesions.   Neck: No neck masses.  Trachea midline.  No thyromegaly/tenderness. Lymph: No cervical or supraclavicular lymphadenopathy. Skin: Warm and dry.  Jaundice.  No suspicious rashes or lesions. Cardiac: RRR, nl S1-S2, no murmurs appreciated.  Capillary refill is brisk.  JVP about 3 cm above clavicle at 30 deg.  No LE edema.  Radial and DP pulses 2+ and symmetric. Respiratory: Normal respiratory rate and rhythm.  CTAB without rales or wheezes. Abdomen: Abdomen soft.  Mild RUQ TTP, no guarding. No ascites, mildly  distended, do not appreciate hepatosplenomegaly.   MSK: No deformities or effusions.  No cyanosis or clubbing. Neuro: Cranial nerves 3-12 intact.  Sensation intact to light touch. Speech is fluent.  Muscle strength globally weak.    Psych: Sensorium intact and responding to questions, attention normal.  Behavior appropriate.  Affect blunted, tired, but pleasant.  Judgment and insight appear normal.     Labs on Admission:  I have personally reviewed following labs and imaging studies: CBC:  Recent Labs Lab 08/01/16 1740  WBC 10.6*  HGB 12.6  HCT 38.4  MCV 89.7  PLT 092   Basic Metabolic Panel:  Recent Labs Lab 08/01/16 1740  NA 134*  K 3.5  CL 99*  CO2 24  GLUCOSE 176*  BUN 21*  CREATININE 1.22*  CALCIUM 9.2   GFR: Estimated Creatinine Clearance: 32.8 mL/min (by C-G formula based on SCr of 1.22 mg/dL (H)).  Liver Function Tests:  Recent Labs Lab 08/01/16 1740  AST 106*  ALT 203*  ALKPHOS 390*  BILITOT 6.3*  PROT 6.7  ALBUMIN 3.1*    Recent Labs Lab 08/01/16 1740  LIPASE 39         Radiological Exams on Admission: Report personally reviewed: Ct Abdomen Pelvis W Contrast  Result Date: 08/01/2016 CLINICAL DATA:  Acute onset of generalized abdominal pain, elevated LFTs and increased urinary frequency. EXAM: CT ABDOMEN AND PELVIS WITH CONTRAST TECHNIQUE: Multidetector CT imaging of the abdomen and pelvis was performed using the standard protocol following bolus administration of intravenous contrast. CONTRAST:  66m ISOVUE-300 IOPAMIDOL (ISOVUE-300) INJECTION 61% COMPARISON:  Abdominal ultrasound performed 03/19/2016 FINDINGS: Lower chest: Minimal bibasilar atelectasis is noted. Pacemaker leads are partially imaged. Hepatobiliary: There is a prominent hypoattenuating mass at the central right hepatic lobe, measuring approximately 5.3 x 3.8 x 4.6 cm. There is some degree of displacement of surrounding structures, with diffuse dilatation of intrahepatic biliary  ducts. Additional vague hypoattenuation is noted within the central right hepatic lobe. This is concerning for malignancy; a hepatic abscess is considered much less likely, as no well defined fluid is seen. The common bile duct remains normal in caliber. Pancreas: A 9 mm cystic focus is noted at the pancreatic tail. The pancreas is otherwise unremarkable. Spleen: The spleen is unremarkable in appearance. Adrenals/Urinary Tract: The adrenal glands are unremarkable in appearance. The kidneys are within normal limits. There is no evidence of hydronephrosis. No renal or ureteral stones are identified. Mild nonspecific perinephric stranding is noted bilaterally. Stomach/Bowel: The appendix is not definitely characterized; there is no evidence of appendicitis. Minimal diverticulosis is noted along the ascending and sigmoid colon, without evidence of diverticulitis. A small umbilical hernia is noted, containing only fat. The stomach is decompressed and grossly unremarkable. The small bowel is within normal limits. Vascular/Lymphatic: Scattered calcification is seen along the abdominal aorta and its branches. The abdominal aorta is otherwise grossly unremarkable. The inferior vena cava is grossly unremarkable. No retroperitoneal lymphadenopathy is seen. No pelvic sidewall lymphadenopathy is identified. Reproductive: The bladder is moderately distended and within normal limits. The uterus is grossly unremarkable in appearance. The ovaries are relatively symmetric. No suspicious adnexal masses are seen. Other: No additional soft tissue abnormalities are seen. Musculoskeletal: No acute osseous abnormalities are identified. The visualized musculature is unremarkable in appearance. There is mild chronic loss of height at vertebral body L2. IMPRESSION: 1. Prominent hypoattenuating mass at the central right hepatic lobe, measuring 5.3 x 3.8 x 4.6 cm. Some degree of displacement of surrounding structures, with diffuse dilatation of  intrahepatic biliary ducts. Additional vague hypoattenuation noted within the central right hepatic lobe. This is concerning for primary hepatic malignancy or cholangiocarcinoma; a hepatic abscess is considered much less likely, as no well defined fluid is seen. 2. **An incidental finding of potential clinical significance has been found. 9 mm cystic focus at the pancreatic tail. As before, follow-up MRCP would be helpful in 8 months to assess stability.** 3. Minimal diverticulosis along the ascending and sigmoid colon, without evidence of diverticulitis. 4. Small umbilical hernia, containing only fat. 5. Mild chronic loss of height at vertebral body L2. Electronically Signed   By: JGarald BaldingM.D.   On: 08/01/2016 23:30    EKG: Independently reviewed. Rate 78, QTc 522, paced rhythm.    Echocardiogram April 2016: EF 40% LVH No significant valvular disease    Assessment/Plan 1. Dehydration:  From poor PO intake in setting of #2.   -Gentle IVF overnight -Ondansetron for nausea -Encourage PO intake -Check orthostatics tomorrow morning  2. Liver mass:  Primary HCC vs cholangiocarcinoma, less likely abscess.  Metastasis? -Will discuss next work up with GI and/or IR tomorrow as inpatient or out  3. Paroxysmal atrial fibrillation:  CHADS2Vasc 6 on rivaroxaban.  eGFR 29. -Continue rivaroxaban  4. Chronic systolic and diastolic CHF:  Appears hypovolemic on exam.  EF 40% in April 2016. -Hold furosemide for today, restart as needed  5. CKD stage IV:  Stable  6. Constipation:  -Suppository once in AM -Senna and docusate, scheduled  7. Hypertension:  Hypertensive at admission -Continue amlodipine -Hydralazine PRN for hypertension  8. IDDM: -Continue Levemir (lower dose from 12 to 6 while in hospital, and correct with SSI) -SSI with meals       DVT prophylaxis: On Xarelto  Code Status: DO NOT RESUSCITATE  Family Communication: Daughter, at bedside. CT findings  discussed.  Plan for treating dehydration, consultation tomorrow to develop work up plan for liver mass.  Disposition Plan: Anticipate IV fluids overnight, and treat constipation.  Consult to GI/IR tomorrow. Consults called: None overngiht Admission status: OBS, med surg      Medical decision making: Patient seen at 12:18 AM on 08/02/2016.  The patient was discussed with Dr. Olen Pel.  What exists of the patient's chart was reviewed in depth and summarized above.  Clinical condition: stable.        Edwin Dada [facility] Pager [phone]

## 2016-08-02 NOTE — ED Notes (Signed)
Admitting doctor at the bedside 

## 2016-08-02 NOTE — ED Notes (Signed)
Report given to rn on 3e 

## 2016-08-02 NOTE — ED Notes (Signed)
Going to ct

## 2016-08-02 NOTE — ED Notes (Signed)
Patient was given a pillow. 

## 2016-08-02 NOTE — Consult Note (Signed)
Referring Provider:  [facility] Primary Care Physician:  Gildardo Cranker, DO Primary Gastroenterologist:  Althia Forts  Reason for Consultation:  Jaundice, liver mass  HPI: Kelly Neal is a 82 y.o. female admitted to the hospital for further evaluation of weakness and jaundice. Patient's daughter started noticing yellowish discoloration around 2 days ago. Patient was complaining of some abdominal distention and lower quadrant discomfort. Has also lost around 7-10 pounds of weight in last 1 month. Upon initial evaluation, patient was found to have jaundice with elevated bilirubin. CT scan was ordered which revealed hypoattenuating mass at the central right hepatic lobe, measuring approximately 5.3 x 3.8 x 4.6 cm, concerning for malignancy.  Patient with no prior colonoscopy. No family history of colon cancer pre patient. No known liver disease per patient.  Patient has history of paroxysmal atrial fibrillation and currently on rivaroxaban, last dose today per patient  Past Medical History:  Diagnosis Date  . Chronic combined systolic and diastolic CHF (congestive heart failure) ([place])    a. 02/2015 Echo: EF 40%, mod LVH, Ao sclerosis w/o stenosis, triv MR.  . CKD (chronic kidney disease), stage III   . DM type 2 (diabetes mellitus, type 2) ([place])   . Essential hypertension   . Hyperlipidemia   . Paroxysmal atrial fibrillation ([place])    a. Dx 08/2014;  b. CHA2DS2VASc = 6-->Xarelto started 02/2015.  . Tachy-brady syndrome ([place])    a. 04/2013 s/p SJM 2210 [facility] DC PPM, Ser#: [id number].    Past Surgical History:  Procedure Laterality Date  . CATARACT EXTRACTION    . EYE SURGERY    . PERMANENT PACEMAKER INSERTION N/A 04/20/2013   Procedure: PERMANENT PACEMAKER INSERTION;  Surgeon: Evans Lance, MD;  Location: [facility] CATH LAB;  Service: Cardiovascular;  Laterality: N/A;    Prior to Admission medications   Medication Sig Start Date End Date Taking? Authorizing Provider  ALPRAZolam (XANAX) 0.25 MG tablet  Take 1/2 tablet in AM and at midday as needed for anxiety; take 1 tablet at bedtime for sleep. 03/13/16  Yes Tiffany L Reed, DO  amLODipine (NORVASC) 5 MG tablet Take 1 tablet (5 mg total) by mouth daily. Please schedule appointment for refills. 07/14/16  Yes Mihai Croitoru, MD  B-D ULTRAFINE III SHORT PEN 31G X 8 MM MISC Use to inject insulin 1 time per day 08/14/15  Yes Renato Shin, MD  Blood Glucose Monitoring Suppl (BAYER CONTOUR NEXT MONITOR) W/DEVICE KIT Use to check blood sugar 2 times per day 03/30/15  Yes Renato Shin, MD  furosemide (LASIX) 20 MG tablet Take 1 tablet (20 mg total) by mouth daily. 03/24/16  Yes Mihai Croitoru, MD  glucose blood (BAYER CONTOUR NEXT TEST) test strip 1 each by Other route 2 (two) times daily. And lancets 2/day. Dx: E11.22 01/28/16  Yes Renato Shin, MD  Insulin Detemir (LEVEMIR FLEXTOUCH) 100 UNIT/ML Pen Inject 12 Units into the skin every morning. And pen needles 1/day 03/05/16  Yes Renato Shin, MD  Rivaroxaban (XARELTO) 15 MG TABS tablet Take 1 tablet (15 mg total) by mouth daily. 06/05/16  Yes Mihai Croitoru, MD    Scheduled Meds: . amLODipine  5 mg Oral Daily  . docusate sodium  100 mg Oral BID  . insulin aspart  0-5 Units Subcutaneous QHS  . insulin aspart  0-9 Units Subcutaneous TID WC  . insulin detemir  6 Units Subcutaneous Daily  . polyethylene glycol  17 g Oral BID  . potassium chloride  40 mEq Oral Q4H  . Rivaroxaban  15 mg Oral Daily  . senna  1 tablet Oral BID   Continuous Infusions:  PRN Meds:.ALPRAZolam, hydrALAZINE, ondansetron **OR** ondansetron (ZOFRAN) IV, oxyCODONE, zolpidem  Allergies as of 08/01/2016 - Review Complete 08/01/2016  Allergen Reaction Noted  . Tylenol [acetaminophen] Other (See Comments) 08/01/2016    Family History  Problem Relation Age of Onset  . Hypertension Mother   . Heart attack Father   . Cancer Brother   . Diabetes Neg Hx     Social History   Social History  . Marital status: Married    Spouse name:  N/A  . Number of children: N/A  . Years of education: N/A   Occupational History  . Not on file.   Social History Main Topics  . Smoking status: Former Smoker    Years: 2.00    Types: Cigarettes    Quit date: 11/17/1950  . Smokeless tobacco: Former Systems developer  . Alcohol use No  . Drug use: No  . Sexual activity: No   Other Topics Concern  . Not on file   Social History Narrative   Diet:    Do you drink/eat things with caffeine? No   Marital status: Widowed                            What year were you married? 1954   Do you live in a house, apartment, assisted living, condo, trailer, etc)? House   Is it one or more stories? No   How many persons live in your home? 2/3   Do you have any pets in your home? No    Current or past profession: Statistician   Do you exercise?  little                                               Type & how often: daily   Do you have a living will? yes   Do you have a DNR Form? yes   Do you have a POA/HPOA forms? yes    Review of Systems: All negative except as stated above in HPI.  Physical Exam: Vital signs: Vitals:   08/02/16 0805 08/02/16 1129  BP: (!) 161/68 (!) 177/74  Pulse: 70 75  Resp: 16 16  Temp: 97.7 F (36.5 C) 97.8 F (36.6 C)     General:   Alert,  Well-developed, well-nourished, pleasant and cooperative in NAD Scleral icterus noted Lungs:  Clear throughout to auscultation.   No wheezes, crackles, or rhonchi. No acute distress. Heart:  Regular rate and rhythm; no murmurs, clicks, rubs,  or gallops. Abdomen: Mildly distended, nontender, bowel sounds present. Lower extremity. No edema  GI:  Lab Results:  Recent Labs  08/01/16 1740  WBC 10.6*  HGB 12.6  HCT 38.4  PLT 316   BMET  Recent Labs  08/01/16 1740 08/02/16 0610  NA 134* 137  K 3.5 3.2*  CL 99* 101  CO2 24 25  GLUCOSE 176* 148*  BUN 21* 19  CREATININE 1.22* 1.14*  CALCIUM 9.2 8.7*   LFT  Recent Labs  08/02/16 0610  PROT 5.5*  ALBUMIN 2.6*   AST 85*  ALT 159*  ALKPHOS 355*  BILITOT 5.5*   PT/INR No results for input(s): LABPROT, INR in the last 72 hours.   Studies/Results: Ct Abdomen Pelvis  W Contrast  Result Date: 08/01/2016 CLINICAL DATA:  Acute onset of generalized abdominal pain, elevated LFTs and increased urinary frequency. EXAM: CT ABDOMEN AND PELVIS WITH CONTRAST TECHNIQUE: Multidetector CT imaging of the abdomen and pelvis was performed using the standard protocol following bolus administration of intravenous contrast. CONTRAST:  58m ISOVUE-300 IOPAMIDOL (ISOVUE-300) INJECTION 61% COMPARISON:  Abdominal ultrasound performed 03/19/2016 FINDINGS: Lower chest: Minimal bibasilar atelectasis is noted. Pacemaker leads are partially imaged. Hepatobiliary: There is a prominent hypoattenuating mass at the central right hepatic lobe, measuring approximately 5.3 x 3.8 x 4.6 cm. There is some degree of displacement of surrounding structures, with diffuse dilatation of intrahepatic biliary ducts. Additional vague hypoattenuation is noted within the central right hepatic lobe. This is concerning for malignancy; a hepatic abscess is considered much less likely, as no well defined fluid is seen. The common bile duct remains normal in caliber. Pancreas: A 9 mm cystic focus is noted at the pancreatic tail. The pancreas is otherwise unremarkable. Spleen: The spleen is unremarkable in appearance. Adrenals/Urinary Tract: The adrenal glands are unremarkable in appearance. The kidneys are within normal limits. There is no evidence of hydronephrosis. No renal or ureteral stones are identified. Mild nonspecific perinephric stranding is noted bilaterally. Stomach/Bowel: The appendix is not definitely characterized; there is no evidence of appendicitis. Minimal diverticulosis is noted along the ascending and sigmoid colon, without evidence of diverticulitis. A small umbilical hernia is noted, containing only fat. The stomach is decompressed and grossly  unremarkable. The small bowel is within normal limits. Vascular/Lymphatic: Scattered calcification is seen along the abdominal aorta and its branches. The abdominal aorta is otherwise grossly unremarkable. The inferior vena cava is grossly unremarkable. No retroperitoneal lymphadenopathy is seen. No pelvic sidewall lymphadenopathy is identified. Reproductive: The bladder is moderately distended and within normal limits. The uterus is grossly unremarkable in appearance. The ovaries are relatively symmetric. No suspicious adnexal masses are seen. Other: No additional soft tissue abnormalities are seen. Musculoskeletal: No acute osseous abnormalities are identified. The visualized musculature is unremarkable in appearance. There is mild chronic loss of height at vertebral body L2. IMPRESSION: 1. Prominent hypoattenuating mass at the central right hepatic lobe, measuring 5.3 x 3.8 x 4.6 cm. Some degree of displacement of surrounding structures, with diffuse dilatation of intrahepatic biliary ducts. Additional vague hypoattenuation noted within the central right hepatic lobe. This is concerning for primary hepatic malignancy or cholangiocarcinoma; a hepatic abscess is considered much less likely, as no well defined fluid is seen. 2. **An incidental finding of potential clinical significance has been found. 9 mm cystic focus at the pancreatic tail. As before, follow-up MRCP would be helpful in 8 months to assess stability.** 3. Minimal diverticulosis along the ascending and sigmoid colon, without evidence of diverticulitis. 4. Small umbilical hernia, containing only fat. 5. Mild chronic loss of height at vertebral body L2. Electronically Signed   By: JGarald BaldingM.D.   On: 08/01/2016 23:30    Impression/Plan: - New-onset jaundice with CT scan showing around 5 cm right hepatic lobe mass concerning for malignancy. Normal caliber CBD - Paroxysmal atrial fibrillation. On  rivaroxaban - Chronic kidney disease - CHF  with EF of 40%  Recommendations ------------------------ - Recommend interventional radiology consult for liver biopsy. Need to discuss with the daughter prior to placing consult. Attempted to call twice without any onset. Patient does not want any chemotherapy or surgery if the mass is malignant. - Anticoagulant also needs to be on hold for 2-3 days prior to liver biopsy -  Discuss with the admitting team.  GI will follow   LOS: 0 days   Cartha Rotert  08/02/2016, 7:15 PM  Pager [phone] If no answer or after 5 PM call [phone]

## 2016-08-02 NOTE — Progress Notes (Signed)
Subjective: Patient admitted this morning, see detailed H&P by Dr. Loleta Books 82 y.o. female with a past medical history significant for CHF EF 40%, CKD IV, pacer, pAF on rivaroxaban, and IDDM who presents with painless jaundice and abdominal distension.  The patient presents with 1-2 weeks decreased appetite, nausea, constipation, abdominal distention, vague abdominal discomfort, and new jaundice.  She has tried OTC suppository without relief of constipation.  This progressed until now for several days she has had barely anything to eat, and is afraid to fall she is so weak and dizzy, her daughter brought her to the ER.  ED course: -Afebrile, heart rate 80s, respirations 14, hypertensive to 195/90, pulse oximetry normal -Na 134, K 3.5, Cr 1.2 (baseline 1.2-1.5), WBC 10.6 K, Hgb 12.6 -AST and ALT were 106, 23, respectively. Total bilirubin 6.3, new from April. lipase normal -Urinalysis showed no pyuria or hematuria Lactate was normal -CT of the abdomen and pelvis with contrast showed a new 5 cm hepatic mass, suspicious for malignancy (primary HCC versus cholangiocarcinoma), with dilated ducts; small pancreatic cysts known from right upper quadrant ultrasound in May were still present.  Patient complains of constipation, poor appetite for the past 1 week.  Vitals:   08/02/16 0805 08/02/16 1129  BP: (!) 161/68 (!) 177/74  Pulse: 70 75  Resp: 16 16  Temp: 97.7 F (36.5 C) 97.8 F (36.6 C)      A/P Liver mass Constipation Jaundice Hypokalemia  Consult GI today MiraLAX 17 g twice a day Replace potassium Follow CMP in a.m.  Discussed with patient's daughter on phone  [facility] Pager- [phone]

## 2016-08-03 DIAGNOSIS — L299 Pruritus, unspecified: Secondary | ICD-10-CM | POA: Diagnosis present

## 2016-08-03 DIAGNOSIS — Z7189 Other specified counseling: Secondary | ICD-10-CM | POA: Diagnosis not present

## 2016-08-03 DIAGNOSIS — E860 Dehydration: Secondary | ICD-10-CM | POA: Diagnosis not present

## 2016-08-03 DIAGNOSIS — R160 Hepatomegaly, not elsewhere classified: Secondary | ICD-10-CM | POA: Diagnosis present

## 2016-08-03 DIAGNOSIS — E861 Hypovolemia: Secondary | ICD-10-CM | POA: Diagnosis present

## 2016-08-03 DIAGNOSIS — E1122 Type 2 diabetes mellitus with diabetic chronic kidney disease: Secondary | ICD-10-CM | POA: Diagnosis present

## 2016-08-03 DIAGNOSIS — R140 Abdominal distension (gaseous): Secondary | ICD-10-CM | POA: Diagnosis present

## 2016-08-03 DIAGNOSIS — Z9889 Other specified postprocedural states: Secondary | ICD-10-CM | POA: Diagnosis not present

## 2016-08-03 DIAGNOSIS — Z794 Long term (current) use of insulin: Secondary | ICD-10-CM | POA: Diagnosis not present

## 2016-08-03 DIAGNOSIS — Z79899 Other long term (current) drug therapy: Secondary | ICD-10-CM | POA: Diagnosis not present

## 2016-08-03 DIAGNOSIS — Z87891 Personal history of nicotine dependence: Secondary | ICD-10-CM | POA: Diagnosis not present

## 2016-08-03 DIAGNOSIS — R1084 Generalized abdominal pain: Secondary | ICD-10-CM | POA: Diagnosis not present

## 2016-08-03 DIAGNOSIS — K5909 Other constipation: Secondary | ICD-10-CM | POA: Diagnosis not present

## 2016-08-03 DIAGNOSIS — I5042 Chronic combined systolic (congestive) and diastolic (congestive) heart failure: Secondary | ICD-10-CM | POA: Diagnosis present

## 2016-08-03 DIAGNOSIS — E876 Hypokalemia: Secondary | ICD-10-CM | POA: Diagnosis present

## 2016-08-03 DIAGNOSIS — K5900 Constipation, unspecified: Secondary | ICD-10-CM | POA: Diagnosis not present

## 2016-08-03 DIAGNOSIS — C221 Intrahepatic bile duct carcinoma: Secondary | ICD-10-CM | POA: Diagnosis present

## 2016-08-03 DIAGNOSIS — I130 Hypertensive heart and chronic kidney disease with heart failure and stage 1 through stage 4 chronic kidney disease, or unspecified chronic kidney disease: Secondary | ICD-10-CM | POA: Diagnosis present

## 2016-08-03 DIAGNOSIS — K761 Chronic passive congestion of liver: Secondary | ICD-10-CM | POA: Diagnosis present

## 2016-08-03 DIAGNOSIS — Z8249 Family history of ischemic heart disease and other diseases of the circulatory system: Secondary | ICD-10-CM | POA: Diagnosis not present

## 2016-08-03 DIAGNOSIS — Z66 Do not resuscitate: Secondary | ICD-10-CM | POA: Diagnosis present

## 2016-08-03 DIAGNOSIS — Z809 Family history of malignant neoplasm, unspecified: Secondary | ICD-10-CM | POA: Diagnosis not present

## 2016-08-03 DIAGNOSIS — Z950 Presence of cardiac pacemaker: Secondary | ICD-10-CM | POA: Diagnosis not present

## 2016-08-03 DIAGNOSIS — N184 Chronic kidney disease, stage 4 (severe): Secondary | ICD-10-CM | POA: Diagnosis present

## 2016-08-03 DIAGNOSIS — I480 Paroxysmal atrial fibrillation: Secondary | ICD-10-CM | POA: Diagnosis present

## 2016-08-03 DIAGNOSIS — Z789 Other specified health status: Secondary | ICD-10-CM | POA: Diagnosis not present

## 2016-08-03 DIAGNOSIS — Z9849 Cataract extraction status, unspecified eye: Secondary | ICD-10-CM | POA: Diagnosis not present

## 2016-08-03 DIAGNOSIS — Z886 Allergy status to analgesic agent status: Secondary | ICD-10-CM | POA: Diagnosis not present

## 2016-08-03 LAB — GLUCOSE, CAPILLARY
GLUCOSE-CAPILLARY: 86 mg/dL (ref 65–99)
GLUCOSE-CAPILLARY: 176 mg/dL — AB (ref 65–99)
GLUCOSE-CAPILLARY: 139 mg/dL — AB (ref 65–99)
Glucose-Capillary: 187 mg/dL — ABNORMAL HIGH (ref 65–99)

## 2016-08-03 LAB — COMPREHENSIVE METABOLIC PANEL
ALT: 148 U/L — ABNORMAL HIGH (ref 14–54)
AST: 97 U/L — ABNORMAL HIGH (ref 15–41)
Albumin: 2.6 g/dL — ABNORMAL LOW (ref 3.5–5.0)
Alkaline Phosphatase: 386 U/L — ABNORMAL HIGH (ref 38–126)
Anion gap: 9 (ref 5–15)
BUN: 15 mg/dL (ref 6–20)
CHLORIDE: 103 mmol/L (ref 101–111)
CO2: 23 mmol/L (ref 22–32)
CREATININE: 1.09 mg/dL — AB (ref 0.44–1.00)
Calcium: 8.8 mg/dL — ABNORMAL LOW (ref 8.9–10.3)
GFR, EST AFRICAN AMERICAN: 52 mL/min — AB (ref >60)
GFR, EST NON AFRICAN AMERICAN: 45 mL/min — AB (ref >60)
Glucose, Bld: 78 mg/dL (ref 65–99)
POTASSIUM: 4.1 mmol/L (ref 3.5–5.1)
Sodium: 135 mmol/L (ref 135–145)
Total Bilirubin: 7.4 mg/dL — ABNORMAL HIGH (ref 0.3–1.2)
Total Protein: 6 g/dL — ABNORMAL LOW (ref 6.5–8.1)

## 2016-08-03 MED ORDER — SODIUM CHLORIDE 0.9 % IV SOLN
INTRAVENOUS | Status: DC
  Administered 2016-08-03 – 2016-08-06 (×4): via INTRAVENOUS

## 2016-08-03 MED ORDER — URSODIOL 300 MG PO CAPS
300.0000 mg | ORAL_CAPSULE | Freq: Two times a day (BID) | ORAL | Status: DC
  Administered 2016-08-03 – 2016-08-04 (×2): 300 mg via ORAL
  Filled 2016-08-03 (×4): qty 1

## 2016-08-03 MED ORDER — BISACODYL 10 MG RE SUPP: 10.0000 mg | Freq: Once | RECTAL | Status: DC

## 2016-08-03 MED ORDER — FLEET ENEMA 7-19 GM/118ML RE ENEM
1.0000 | ENEMA | Freq: Once | RECTAL | Status: AC
  Administered 2016-08-03: 1 via RECTAL
  Filled 2016-08-03: qty 1

## 2016-08-03 NOTE — Progress Notes (Signed)
PROGRESS NOTE    Kelly Neal  [id number] DOB: July 19, 1928 DOA: 08/01/2016 PCP: Gildardo Cranker, DO   Brief Narrative:Kelly Neal is a 82 y.o. female with a past medical history significant for CHF EF 40%, CKD IV, pacer, pAF on rivaroxaban, and IDDM who presents with painless jaundice and abdominal distension. CT of the abdomen and pelvis with contrast showed a new 5 cm hepatic mass, suspicious for malignancy (primary [place] versus cholangiocarcinoma), with dilated ducts; small pancreatic cysts known from right upper quadrant ultrasound in May were still present.     Assessment & Plan:   Principal Problem:   Dehydration Active Problems:   Cardiac pacemaker in situ-St Jude implanted 04/20/13   Dizzy spells   Paroxysmal atrial fibrillation (HCC)   Chronic combined systolic and diastolic CHF (congestive heart failure) (HCC)   CKD (chronic kidney disease) stage 4, GFR 15-29 ml/min (HCC)   Essential hypertension   Liver mass   Constipation   Abdominal distention and jaundice from the liver mass; GI consulted and recommendations given.  IR consulted for liver biopsy. Though the patient reluctantly agreed to undergo the procedure, she doesn t want any chemo or surgery if the mass is malignant.  Stopped xarelto for the procedure.    Chronic systolic heart failure: - appears compensated.   Acute on CKD 4 : Improving.  Monitor.    Paroxysmal atrial fibrillation;  xarelto held for the procedure.    Hypertension" controlled.   Constipation: suppositories and laxatives ordered.    Dehydration: restart IV fluids.    DVT prophylaxis: scd's/ Code Status: DNR Family Communication: discussed with daughter at bedside.  Disposition Plan: Pending further eval.   Consultants:   GI  IR   Procedures: liver biopsy.   Antimicrobials: none   Subjective: Requesting for enema.   Objective: Vitals:   08/02/16 0805 08/02/16 1129 08/02/16 2020 08/03/16 1240  BP: (!)  161/68 (!) 177/74 (!) 166/86 (!) 170/79  Pulse: 70 75 98 76  Resp: 16 16 18 18   Temp: 97.7 F (36.5 C) 97.8 F (36.6 C) 97.9 F (36.6 C) 98.1 F (36.7 C)  TempSrc: Oral Oral Oral Oral  SpO2: 96% 97% 98% 97%  Weight:      Height:        Intake/Output Summary (Last 24 hours) at 08/03/16 1305 Last data filed at 08/03/16 0732  Gross per 24 hour  Intake              720 ml  Output             1100 ml  Net             -380 ml   Filed Weights   08/01/16 1739 08/02/16 0231  Weight: 61.7 kg (136 lb) 58.6 kg (129 lb 3.2 oz)    Examination:  General exam: Appears cachetic looking. Jaundiced.  Respiratory system: Clear to auscultation. Respiratory effort normal. Cardiovascular system: S1 & S2 heard, RRR. No JVD, murmurs, rubs, gallops or clicks. No pedal edema. Gastrointestinal system: Abdomen is distended soft and mildly tender in the right upper quadrant..Normal bowel sounds heard. Central nervous system: Alert and oriented. No focal neurological deficits. Extremities: Symmetric 5 x 5 power. Skin: No rashes, lesions or ulcers Psychiatry: Judgement and insight appear normal. Mood & affect appropriate.     Data Reviewed: I have personally reviewed following labs and imaging studies  CBC:  Recent Labs Lab 08/01/16 1740  WBC 10.6*  HGB 12.6  HCT 38.4  MCV 89.7  PLT 123XX123   Basic Metabolic Panel:  Recent Labs Lab 08/01/16 1740 08/02/16 0610 08/03/16 0230  NA 134* 137 135  K 3.5 3.2* 4.1  CL 99* 101 103  CO2 24 25 23   GLUCOSE 176* 148* 78  BUN 21* 19 15  CREATININE 1.22* 1.14* 1.09*  CALCIUM 9.2 8.7* 8.8*   GFR: Estimated Creatinine Clearance: 34.9 mL/min (by C-G formula based on SCr of 1.09 mg/dL (H)). Liver Function Tests:  Recent Labs Lab 08/01/16 1740 08/02/16 0610 08/03/16 0230  AST 106* 85* 97*  ALT 203* 159* 148*  ALKPHOS 390* 355* 386*  BILITOT 6.3* 5.5* 7.4*  PROT 6.7 5.5* 6.0*  ALBUMIN 3.1* 2.6* 2.6*    Recent Labs Lab 08/01/16 1740    LIPASE 39   No results for input(s): AMMONIA in the last 168 hours. Coagulation Profile: No results for input(s): INR, PROTIME in the last 168 hours. Cardiac Enzymes: No results for input(s): CKTOTAL, CKMB, CKMBINDEX, TROPONINI in the last 168 hours. BNP (last 3 results) No results for input(s): PROBNP in the last 8760 hours. HbA1C: No results for input(s): HGBA1C in the last 72 hours. CBG:  Recent Labs Lab 08/02/16 1132 08/02/16 1642 08/02/16 2211 08/03/16 0640 08/03/16 1238  GLUCAP 167* 160* 95 86 176*   Lipid Profile: No results for input(s): CHOL, HDL, LDLCALC, TRIG, CHOLHDL, LDLDIRECT in the last 72 hours. Thyroid Function Tests: No results for input(s): TSH, T4TOTAL, FREET4, T3FREE, THYROIDAB in the last 72 hours. Anemia Panel: No results for input(s): VITAMINB12, FOLATE, FERRITIN, TIBC, IRON, RETICCTPCT in the last 72 hours. Sepsis Labs:  Recent Labs Lab 08/01/16 2014  LATICACIDVEN 0.74    No results found for this or any previous visit (from the past 240 hour(s)).       Radiology Studies: Ct Abdomen Pelvis W Contrast  Result Date: 08/01/2016 CLINICAL DATA:  Acute onset of generalized abdominal pain, elevated LFTs and increased urinary frequency. EXAM: CT ABDOMEN AND PELVIS WITH CONTRAST TECHNIQUE: Multidetector CT imaging of the abdomen and pelvis was performed using the standard protocol following bolus administration of intravenous contrast. CONTRAST:  9mL ISOVUE-300 IOPAMIDOL (ISOVUE-300) INJECTION 61% COMPARISON:  Abdominal ultrasound performed 03/19/2016 FINDINGS: Lower chest: Minimal bibasilar atelectasis is noted. Pacemaker leads are partially imaged. Hepatobiliary: There is a prominent hypoattenuating mass at the central right hepatic lobe, measuring approximately 5.3 x 3.8 x 4.6 cm. There is some degree of displacement of surrounding structures, with diffuse dilatation of intrahepatic biliary ducts. Additional vague hypoattenuation is noted within the  central right hepatic lobe. This is concerning for malignancy; a hepatic abscess is considered much less likely, as no well defined fluid is seen. The common bile duct remains normal in caliber. Pancreas: A 9 mm cystic focus is noted at the pancreatic tail. The pancreas is otherwise unremarkable. Spleen: The spleen is unremarkable in appearance. Adrenals/Urinary Tract: The adrenal glands are unremarkable in appearance. The kidneys are within normal limits. There is no evidence of hydronephrosis. No renal or ureteral stones are identified. Mild nonspecific perinephric stranding is noted bilaterally. Stomach/Bowel: The appendix is not definitely characterized; there is no evidence of appendicitis. Minimal diverticulosis is noted along the ascending and sigmoid colon, without evidence of diverticulitis. A small umbilical hernia is noted, containing only fat. The stomach is decompressed and grossly unremarkable. The small bowel is within normal limits. Vascular/Lymphatic: Scattered calcification is seen along the abdominal aorta and its branches. The abdominal aorta is otherwise grossly unremarkable. The inferior vena cava is grossly  unremarkable. No retroperitoneal lymphadenopathy is seen. No pelvic sidewall lymphadenopathy is identified. Reproductive: The bladder is moderately distended and within normal limits. The uterus is grossly unremarkable in appearance. The ovaries are relatively symmetric. No suspicious adnexal masses are seen. Other: No additional soft tissue abnormalities are seen. Musculoskeletal: No acute osseous abnormalities are identified. The visualized musculature is unremarkable in appearance. There is mild chronic loss of height at vertebral body L2. IMPRESSION: 1. Prominent hypoattenuating mass at the central right hepatic lobe, measuring 5.3 x 3.8 x 4.6 cm. Some degree of displacement of surrounding structures, with diffuse dilatation of intrahepatic biliary ducts. Additional vague  hypoattenuation noted within the central right hepatic lobe. This is concerning for primary hepatic malignancy or cholangiocarcinoma; a hepatic abscess is considered much less likely, as no well defined fluid is seen. 2. **An incidental finding of potential clinical significance has been found. 9 mm cystic focus at the pancreatic tail. As before, follow-up MRCP would be helpful in 8 months to assess stability.** 3. Minimal diverticulosis along the ascending and sigmoid colon, without evidence of diverticulitis. 4. Small umbilical hernia, containing only fat. 5. Mild chronic loss of height at vertebral body L2. Electronically Signed   By: Garald Balding M.D.   On: 08/01/2016 23:30        Scheduled Meds: . amLODipine  5 mg Oral Daily  . bisacodyl  10 mg Rectal Once  . docusate sodium  100 mg Oral BID  . insulin aspart  0-5 Units Subcutaneous QHS  . insulin aspart  0-9 Units Subcutaneous TID WC  . insulin detemir  6 Units Subcutaneous Daily  . polyethylene glycol  17 g Oral BID  . senna  1 tablet Oral BID  . sodium phosphate  1 enema Rectal Once  . ursodiol  300 mg Oral BID   Continuous Infusions:    LOS: 0 days    Time spent: 25 minutes    Jedediah Noda, MD [facility] Pager [phone]  If 7PM-7AM, please contact night-coverage [email or web address] Password [email or web address] 08/03/2016, 1:05 PM

## 2016-08-03 NOTE — Progress Notes (Signed)
[facility]astro[facility]ology Progress Note  Kelly Neal 82 y.o. [birth year]  Chief complaint. Follow-up for liver mass and jaundice.  Subjective: Patient is complaining of abdominal distention. No bowel movement since last few days. Denied nausea or vomiting. Afebrile.  Objective: Vital signs in last 24 hours: Vitals:   08/02/16 1129 08/02/16 2020  BP: (!) 177/74 (!) 166/86  Pulse: 75 98  Resp: 16 18  Temp: 97.8 F (36.6 C) 97.9 F (36.6 C)   ROS : Patient denied chest pain or difficulty breathing. Denied blood in the stool. Denied nausea or vomiting. Physical Exam:  General:  Alert, cooperative, no distress, appears stated age     Eyes:  , EOM's intact, Scleral icterus noted   Lungs:   Clear to auscultation bilaterally, respirations unlabored  Heart:  Regular rate and rhythm, S1, S2 normal  Abdomen:   Mildly distended, nontender, bowel sounds present,   Extremities: Extremities normal, atraumatic, no  edema       Lab Results:  Recent Labs  08/02/16 0610 08/03/16 0230  NA 137 135  K 3.2* 4.1  CL 101 103  CO2 25 23  GLUCOSE 148* 78  BUN 19 15  CREATININE 1.14* 1.09*  CALCIUM 8.7* 8.8*    Recent Labs  08/02/16 0610 08/03/16 0230  AST 85* 97*  ALT 159* 148*  ALKPHOS 355* 386*  BILITOT 5.5* 7.4*  PROT 5.5* 6.0*  ALBUMIN 2.6* 2.6*    Recent Labs  08/01/16 1740  WBC 10.6*  HGB 12.6  HCT 38.4  MCV 89.7  PLT 316   No results for input(s): LABPROT, INR in the last 72 hours.    Assessment/Plan: - New-onset jaundice with CT scan showing around 5 cm right hepatic lobe mass concerning for malignancy. Normal caliber CBD - Paroxysmal atrial fibrillation. On  rivaroxaban - A 9 mm cystic focus in  pancreatic tail - Constipation - Chronic kidney disease - CHF with EF of 40%  Recommendations ------------------------ - Discussed with daughter in detail. Patient is agreeable for liver biopsy. Order has been placed by admitting team. - Anticoagulant also  needs to be on hold for 2-3 days prior to liver biopsy - Patient with normal caliber CBD. Not sure that ERCP with stent placement will help with jaundice. Consider trial of ursodiol for  worsening jaundice  GI will follow   Otis Brace, MD FACP  08/03/2016, 12:11 PM  Pager [phone]  If no answer or after 5 PM call [phone]

## 2016-08-04 ENCOUNTER — Telehealth: Payer: Self-pay | Admitting: Cardiovascular Disease

## 2016-08-04 DIAGNOSIS — Z7189 Other specified counseling: Secondary | ICD-10-CM

## 2016-08-04 DIAGNOSIS — Z515 Encounter for palliative care: Secondary | ICD-10-CM

## 2016-08-04 DIAGNOSIS — Z789 Other specified health status: Secondary | ICD-10-CM

## 2016-08-04 LAB — GLUCOSE, CAPILLARY
GLUCOSE-CAPILLARY: 103 mg/dL — AB (ref 65–99)
Glucose-Capillary: 149 mg/dL — ABNORMAL HIGH (ref 65–99)
Glucose-Capillary: 91 mg/dL (ref 65–99)
Glucose-Capillary: 138 mg/dL — ABNORMAL HIGH (ref 65–99)
Glucose-Capillary: 161 mg/dL — ABNORMAL HIGH (ref 65–99)

## 2016-08-04 LAB — PROTIME-INR
INR: 1.09
Prothrombin Time: 14.2 seconds (ref 11.4–15.2)

## 2016-08-04 MED ORDER — CHOLESTYRAMINE LIGHT 4 G PO PACK
4.0000 g | PACK | Freq: Every day | ORAL | Status: DC
  Filled 2016-08-04: qty 1

## 2016-08-04 MED ORDER — URSODIOL 300 MG PO CAPS
300.0000 mg | ORAL_CAPSULE | Freq: Three times a day (TID) | ORAL | Status: DC
  Administered 2016-08-04 – 2016-08-06 (×7): 300 mg via ORAL
  Filled 2016-08-04 (×10): qty 1

## 2016-08-04 NOTE — Telephone Encounter (Signed)
Returned Kelly Neal's call. She was tearful. Notes pt has new finding of liver mass, is admitted at [facility]. Expressed my sympathies for her mother being in hospital, and ensured her I would inform Dr. Sallyanne Kuster of current admission. She voiced thanks. Sent to MD.

## 2016-08-04 NOTE — Progress Notes (Signed)
PROGRESS NOTE    Kelly Neal  [id number] DOB: 1928-07-15 DOA: 08/01/2016 PCP: Gildardo Cranker, DO   Brief Narrative:Kelly Neal is a 82 y.o. female with a past medical history significant for CHF EF 40%, CKD IV, pacer, pAF on rivaroxaban, and IDDM who presents with painless jaundice and abdominal distension. CT of the abdomen and pelvis with contrast showed a new 5 cm hepatic mass, suspicious for malignancy (primary [place] versus cholangiocarcinoma), with dilated ducts; small pancreatic cysts known from right upper quadrant ultrasound in May were still present.    Assessment & Plan:   Principal Problem:   Dehydration Active Problems:   Cardiac pacemaker in situ-St Jude implanted 04/20/13   Dizzy spells   Paroxysmal atrial fibrillation (HCC)   Chronic combined systolic and diastolic CHF (congestive heart failure) (HCC)   CKD (chronic kidney disease) stage 4, GFR 15-29 ml/min (HCC)   Essential hypertension   Liver mass   Constipation   Advance care planning   Palliative care by specialist   Abdominal distention and jaundice from the liver mass; GI consulted and recommendations given.  IR consulted for liver biopsy. Reportedly she doesn t want any chemo or surgery if the mass is malignant.  But she would like to know what it the mass is.  Stopped xarelto for the procedure.   Chronic systolic heart failure: - appears compensated.   Acute on CKD 4 : Improving on last check.    Paroxysmal atrial fibrillation;  xarelto held for the procedure.    Hypertension - continue amlodipine  Constipation: suppositories and laxatives ordered.   Dehydration: restart IV fluids.    DVT prophylaxis: scd's Code Status: DNR Family Communication: discussed with daughter at bedside.  Disposition Plan: Pending further eval.   Consultants:   GI  IR   Procedures: liver biopsy.   Antimicrobials: none  Subjective: Pt has no new complaints.    Objective: Vitals:   08/03/16 1240 08/03/16 2218 08/04/16 0511 08/04/16 1143  BP: (!) 170/79 (!) 172/78 (!) 161/68 (!) 162/80  Pulse: 76 84 77 81  Resp: 18 18 20 18   Temp: 98.1 F (36.7 C) 97.8 F (36.6 C) 98 F (36.7 C) 98.4 F (36.9 C)  TempSrc: Oral Oral Oral Oral  SpO2: 97% 93% 95% 97%  Weight:   61.2 kg (135 lb)   Height:        Intake/Output Summary (Last 24 hours) at 08/04/16 1543 Last data filed at 08/04/16 1428  Gross per 24 hour  Intake             1725 ml  Output             1900 ml  Net             -175 ml   Filed Weights   08/01/16 1739 08/02/16 0231 08/04/16 0511  Weight: 61.7 kg (136 lb) 58.6 kg (129 lb 3.2 oz) 61.2 kg (135 lb)    Examination:  General exam: Appears cachetic looking. Jaundiced. In nad.  Respiratory system: Clear to auscultation. Respiratory effort normal. Cardiovascular system: S1 & S2 heard, RRR. No JVD, murmurs, rubs, gallops or clicks. No pedal edema. Gastrointestinal system: Abdomen is distended soft and mildly tender in the right upper quadrant..Normal bowel sounds heard. Central nervous system: Alert and oriented. No focal neurological deficits. Extremities: Symmetric 5 x 5 power. Skin: No rashes, lesions or ulcers Psychiatry: Judgement and insight appear normal. Mood & affect appropriate.    Data Reviewed: I have personally  reviewed following labs and imaging studies  CBC:  Recent Labs Lab 08/01/16 1740  WBC 10.6*  HGB 12.6  HCT 38.4  MCV 89.7  PLT 123XX123   Basic Metabolic Panel:  Recent Labs Lab 08/01/16 1740 08/02/16 0610 08/03/16 0230  NA 134* 137 135  K 3.5 3.2* 4.1  CL 99* 101 103  CO2 24 25 23   GLUCOSE 176* 148* 78  BUN 21* 19 15  CREATININE 1.22* 1.14* 1.09*  CALCIUM 9.2 8.7* 8.8*   GFR: Estimated Creatinine Clearance: 36.5 mL/min (by C-G formula based on SCr of 1.09 mg/dL (H)). Liver Function Tests:  Recent Labs Lab 08/01/16 1740 08/02/16 0610 08/03/16 0230  AST 106* 85* 97*  ALT 203* 159* 148*  ALKPHOS 390* 355*  386*  BILITOT 6.3* 5.5* 7.4*  PROT 6.7 5.5* 6.0*  ALBUMIN 3.1* 2.6* 2.6*    Recent Labs Lab 08/01/16 1740  LIPASE 39   No results for input(s): AMMONIA in the last 168 hours. Coagulation Profile:  Recent Labs Lab 08/04/16 1018  INR 1.09   Cardiac Enzymes: No results for input(s): CKTOTAL, CKMB, CKMBINDEX, TROPONINI in the last 168 hours. BNP (last 3 results) No results for input(s): PROBNP in the last 8760 hours. HbA1C: No results for input(s): HGBA1C in the last 72 hours. CBG:  Recent Labs Lab 08/03/16 1238 08/03/16 1640 08/03/16 2310 08/04/16 0608 08/04/16 1143  GLUCAP 176* 187* 139* 91 103*   Lipid Profile: No results for input(s): CHOL, HDL, LDLCALC, TRIG, CHOLHDL, LDLDIRECT in the last 72 hours. Thyroid Function Tests: No results for input(s): TSH, T4TOTAL, FREET4, T3FREE, THYROIDAB in the last 72 hours. Anemia Panel: No results for input(s): VITAMINB12, FOLATE, FERRITIN, TIBC, IRON, RETICCTPCT in the last 72 hours. Sepsis Labs:  Recent Labs Lab 08/01/16 2014  LATICACIDVEN 0.74    No results found for this or any previous visit (from the past 240 hour(s)).   Radiology Studies: No results found.   Scheduled Meds: . amLODipine  5 mg Oral Daily  . bisacodyl  10 mg Rectal Once  . docusate sodium  100 mg Oral BID  . insulin aspart  0-5 Units Subcutaneous QHS  . insulin aspart  0-9 Units Subcutaneous TID WC  . insulin detemir  6 Units Subcutaneous Daily  . polyethylene glycol  17 g Oral BID  . senna  1 tablet Oral BID  . ursodiol  300 mg Oral TID   Continuous Infusions: . sodium chloride 75 mL/hr at 08/03/16 2331     LOS: 1 day   Time spent: 25 minutes  Velvet Bathe, MD [facility] Pager [phone]  If 7PM-7AM, please contact night-coverage [email or web address] Password [email or web address] 08/04/2016, 3:43 PM

## 2016-08-04 NOTE — Consult Note (Signed)
   [facility] Inpatient Consult   08/04/2016  Kelly Neal 10/17/1928 [id number]   Patient screened and discussed for HF screen for community care management needs.  Chart review reveals per MD notes the patient is an 82 y.o. female  with past medical history of HF (EF 40%), CKD IV, Afib (on rivoroxaban and paced), IDDM, admitted on 08/01/2016 with abdominal pain, weakness, poor po intake, abdominal distension and jaundice. Workup revealed a 5cm hepatic mass with duct dilation, elevated transaminases, elevated bilirubin. Biopsy was recommended but IR cancelled d/t liver congestion from obstructed ducts. Pt not able to have MRI d/t pacemaker. AFP and CA19-9 tests are pending. ERCP was recommended for palliation, but per GI will likely not be beneficial d/t  CBD being normal and dilatation being intrahepatic and diffuse. Patient and family have discussed options for Hospice and comfort care. [facility] services are not appropriate at this time.. Patient is eligible for [facility] Care Management services under patient's Medicare  plan.   For questions contact:   Natividad Brood, RN BSN [facility] Liaison  [phone] business mobile phone Toll free office [phone]

## 2016-08-04 NOTE — Telephone Encounter (Signed)
New message   Pt daughter verbalized that mother is in the hospital

## 2016-08-04 NOTE — Progress Notes (Addendum)
[facility] Progress Note  Subjective: Patient complaining of moderate pruritus and mild abdominal distention  Objective: Vital signs in last 24 hours: Temp:  [97.8 F (36.6 Neal)-98.1 F (36.7 Neal)] 98 F (36.7 Neal) (09/18 0511) Pulse Rate:  [76-84] 77 (09/18 0511) Resp:  [18-20] 20 (09/18 0511) BP: (161-172)/(68-79) 161/68 (09/18 0511) SpO2:  [93 %-97 %] 95 % (09/18 0511) Weight:  [61.2 kg (135 lb)] 61.2 kg (135 lb) (09/18 0511) Weight change:    PE: Mildly jaundiced  Lab Results: Results for orders placed or performed during the hospital encounter of 08/01/16 (from the past 24 hour(s))  Glucose, capillary     Status: Abnormal   Collection Time: 08/03/16 12:38 PM  Result Value Ref Range   Glucose-Capillary 176 (H) 65 - 99 mg/dL  Glucose, capillary     Status: Abnormal   Collection Time: 08/03/16  4:40 PM  Result Value Ref Range   Glucose-Capillary 187 (H) 65 - 99 mg/dL  Glucose, capillary     Status: Abnormal   Collection Time: 08/03/16 11:10 PM  Result Value Ref Range   Glucose-Capillary 139 (H) 65 - 99 mg/dL  Glucose, capillary     Status: None   Collection Time: 08/04/16  6:08 AM  Result Value Ref Range   Glucose-Capillary 91 65 - 99 mg/dL    Studies/Results: No results found.    Assessment: 5 cm hepatic mass suspicious for hepatoma versus metastases with associated intrahepatic bile duct dilatation abscess apparently not entirely ruled out  Plan: Difficult situation. Radiology defers biopsy because of increased risk. Hepatoma can often be confidently diagnosed by MRI, alpha-fetoprotein, and size of lesion but she cannot have an MRI due to pacemaker. ERCP unlikely to provide diagnostic information or provide meaningful palliation since common bile duct is normal caliber and biliary dilatation is intrahepatic and diffuse in nature .  Will obtain alpha-fetoprotein which I cannot see has been ordered as well as a CA-19-9. We'll discuss further with other  physicians and consider oncology consult, palliative care depending on patient and family desires, further input from radiology on any imaging options to increase diagnostic certainty, etc. We'll try her on urso deoxycholic acid  for her pruritus Will try ursodiol for  Kelly Neal 08/04/2016, 9:52 AM  Pager [phone] If no answer or after 5 PM call [phone]

## 2016-08-04 NOTE — Progress Notes (Signed)
Palliative Medicine consult noted. Due to high referral volume, there may be a delay seeing this patient. Please call the Palliative Medicine Team office at [phone] if recommendations are needed in the interim.  Thank you for inviting [place] to see this patient.  Marjie Skiff Jazzelle Zhang, RN, BSN, [facility] 08/04/2016 9:30 AM Cell [phone] 8:00-4:00 Monday-Friday Office [phone]

## 2016-08-04 NOTE — Consult Note (Signed)
Consultation Note Date: 08/04/2016   Patient Name: Kelly Neal  DOB: 08/11/1928  MRN: [id number]  Age / Sex: 82 y.o., female  PCP: Gildardo Cranker, DO Referring Physician: Velvet Bathe, MD  Reason for Consultation: Establishing goals of care  HPI/Patient Profile: 82 y.o. female  with past medical history of CHF (EF 40%), CKD IV, Afib (on rivoroxaban and paced), IDDM, admitted on 08/01/2016 with abdominal pain, weakness, poor po intake, abdominal distension and jaundice. Workup revealed a 5cm hepatic mass with duct dilation, elevated transaminases, elevated bilirubin. Biopsy was recommended but IR cancelled d/t liver congestion from obstructed ducts. Pt not able to have MRI d/t pacemaker. AFP and CA19-9 tests are pending. ERCP was recommended for palliation, but per GI will likely not be beneficial d/t  CBD being normal and dilatation being intrahepatic and diffuse.   Clinical Assessment and Goals of Care: Met with patient and her daughter. Patient does not want aggressive care. If cancer was found on biopsy, she would not want it to be treated. Her main goals of care for now are for quality and comfort and not quantity. She is having abdominal pain that she describes as pressure and pain. Also has some anxiety over diagnosis. She has also had some dizziness and headaches. She does not want "a lot of procedures". We discussed a watchful waiting approach and treating symptoms vs aggressive diagnostics. She prefers to wait for results of CA19-9 and AFP and the if results indicate that cancer is likely, she would like to proceed home with Hospice care and kept comfortable. Prior to admission patient was living at home with her son who is functioning highly with Autism. She has had weight loss of 7 pounds in the last month and has noticed a significant decline in her overall functional status. She would like to remain as  independent as possible for as long as possible, but acknowledges she is likely to need assistance at discharge. She and her daughter requested referral for PT to help prevent deconditioning during this hospital stay. We did discuss that if this is cancer, then she is not likely to regain functional status. Daughter and patient are aware of this and express desire to proceed with Hospice care in this case.   NEXT OF KIN- daughter- Kelly Neal    SUMMARY OF RECOMMENDATIONS -No biopsy -DNR -Transition to hospice and comfort care if CA19-9 and AFP indicate mass is likely cancer -No chemotherapy, no surgery, no radiation -PT consult for deconditioning and safety eval    Code Status/Advance Care Planning:  DNR    Symptom Management:   Continue Oxycodone IR 9m q4hr prn pain, will review 24 hr need tomorrow and consider transition to long acting pain medication   Prophylaxis for opioid induced constipation:    -senkakot 17.220mBID    -miralax 17h once daily    Palliative Prophylaxis:   Bowel Regimen  Additional Recommendations (Limitations, Scope, Preferences):  No Artificial Feeding, No Blood Transfusions, No Chemotherapy, No Hemodialysis, No Radiation, No Surgical Procedures and No Tracheostomy  Psycho-social/Spiritual:   Desire for further Chaplaincy support:No  Additional Recommendations: Education on Hospice  Prognosis:   Unable to determine - if patient has hepatic cancer then likely <6 mnths  Discharge Planning: to be determined- home w/ Hospice vs. SNF      Primary Diagnoses: Present on Admission: . Paroxysmal atrial fibrillation ([place]) . Essential hypertension . CKD (chronic kidney disease) stage 4, GFR 15-29 ml/min (HCC) . Chronic combined systolic and diastolic CHF (congestive heart failure) ([place]) . Cardiac pacemaker in situ-St Jude implanted 04/20/13 . Liver mass . Dehydration . Constipation   I have reviewed the medical record, interviewed the patient and  family, and examined the patient. The following aspects are pertinent.  Past Medical History:  Diagnosis Date  . Chronic combined systolic and diastolic CHF (congestive heart failure) ([place])    a. 02/2015 Echo: EF 40%, mod LVH, Ao sclerosis w/o stenosis, triv MR.  . CKD (chronic kidney disease), stage III   . DM type 2 (diabetes mellitus, type 2) ([place])   . Essential hypertension   . Hyperlipidemia   . Paroxysmal atrial fibrillation ([place])    a. Dx 08/2014;  b. CHA2DS2VASc = 6-->Xarelto started 02/2015.  . Tachy-brady syndrome ([place])    a. 04/2013 s/p SJM 2210 Accent DC PPM, Ser#: [id number].   Social History   Social History  . Marital status: Married    Spouse name: N/A  . Number of children: N/A  . Years of education: N/A   Social History Main Topics  . Smoking status: Former Smoker    Years: 2.00    Types: Cigarettes    Quit date: 11/17/1950  . Smokeless tobacco: Former Systems developer  . Alcohol use No  . Drug use: No  . Sexual activity: No   Other Topics Concern  . None   Social History Narrative   Diet:    Do you drink/eat things with caffeine? No   Marital status: Widowed                            What year were you married? 1954   Do you live in a house, apartment, assisted living, condo, trailer, etc)? House   Is it one or more stories? No   How many persons live in your home? 2/3   Do you have any pets in your home? No    Current or past profession: Statistician   Do you exercise?  little                                               Type & how often: daily   Do you have a living will? yes   Do you have a DNR Form? yes   Do you have a POA/HPOA forms? yes   Family History  Problem Relation Age of Onset  . Hypertension Mother   . Heart attack Father   . Cancer Brother   . Diabetes Neg Hx    Scheduled Meds: . amLODipine  5 mg Oral Daily  . bisacodyl  10 mg Rectal Once  . docusate sodium  100 mg Oral BID  . insulin aspart  0-5 Units Subcutaneous QHS  . insulin aspart   0-9 Units Subcutaneous TID WC  . insulin detemir  6 Units Subcutaneous Daily  . polyethylene glycol  17 g  Oral BID  . senna  1 tablet Oral BID  . ursodiol  300 mg Oral TID   Continuous Infusions: . sodium chloride 75 mL/hr at 08/03/16 2331   PRN Meds:.ALPRAZolam, hydrALAZINE, ondansetron **OR** ondansetron (ZOFRAN) IV, oxyCODONE, zolpidem Medications Prior to Admission:  Prior to Admission medications   Medication Sig Start Date End Date Taking? Authorizing Provider  ALPRAZolam (XANAX) 0.25 MG tablet Take 1/2 tablet in AM and at midday as needed for anxiety; take 1 tablet at bedtime for sleep. 03/13/16  Yes Tiffany L Reed, DO  amLODipine (NORVASC) 5 MG tablet Take 1 tablet (5 mg total) by mouth daily. Please schedule appointment for refills. 07/14/16  Yes Mihai Croitoru, MD  B-D ULTRAFINE III SHORT PEN 31G X 8 MM MISC Use to inject insulin 1 time per day 08/14/15  Yes Renato Shin, MD  Blood Glucose Monitoring Suppl (BAYER CONTOUR NEXT MONITOR) W/DEVICE KIT Use to check blood sugar 2 times per day 03/30/15  Yes Renato Shin, MD  furosemide (LASIX) 20 MG tablet Take 1 tablet (20 mg total) by mouth daily. 03/24/16  Yes Mihai Croitoru, MD  glucose blood (BAYER CONTOUR NEXT TEST) test strip 1 each by Other route 2 (two) times daily. And lancets 2/day. Dx: E11.22 01/28/16  Yes Renato Shin, MD  Insulin Detemir (LEVEMIR FLEXTOUCH) 100 UNIT/ML Pen Inject 12 Units into the skin every morning. And pen needles 1/day 03/05/16  Yes Renato Shin, MD  Rivaroxaban (XARELTO) 15 MG TABS tablet Take 1 tablet (15 mg total) by mouth daily. 06/05/16  Yes Sanda Klein, MD   Allergies  Allergen Reactions  . Tylenol [Acetaminophen] Other (See Comments)    MD thinks its causing internal bleeding    Review of Systems  Constitutional: Positive for appetite change, fatigue and unexpected weight change (loss).  Respiratory: Negative for apnea, cough and shortness of breath.   Cardiovascular: Negative for chest pain.    Gastrointestinal: Positive for abdominal distention, abdominal pain, constipation and nausea. Negative for blood in stool and vomiting.  Genitourinary: Negative for decreased urine volume, difficulty urinating and dysuria.  Musculoskeletal: Positive for arthralgias. Negative for gait problem.  Skin: Positive for color change.  Neurological: Positive for dizziness and headaches.    Physical Exam  Constitutional: She is oriented to person, place, and time.  frail  HENT:  Head: Normocephalic and atraumatic.  Cardiovascular: Normal rate, regular rhythm, normal heart sounds and intact distal pulses.   Pulmonary/Chest: Effort normal and breath sounds normal. No respiratory distress. She has no wheezes. She has no rales.  Abdominal: Soft. Bowel sounds are normal. She exhibits distension.  Musculoskeletal: Normal range of motion.  Neurological: She is alert and oriented to person, place, and time.  Skin:  Jaundice   Psychiatric: She has a normal mood and affect. Her behavior is normal. Judgment and thought content normal.  Nursing note and vitals reviewed.   Vital Signs: BP (!) 162/80 (BP Location: Left Arm)   Pulse 81   Temp 98.4 F (36.9 C) (Oral)   Resp 18   Ht _0  (1.702 m)   Wt 61.2 kg (135 lb) Comment: bed  SpO2 97%   BMI 21.14 kg/m  Pain Assessment: No/denies pain   Pain Score: 0-No pain   SpO2: SpO2: 97 % O2 Device:SpO2: 97 % O2 Flow Rate: .   IO: Intake/output summary:  Intake/Output Summary (Last 24 hours) at 08/04/16 1330 Last data filed at 08/04/16 1056  Gross per 24 hour  Intake  1485 ml  Output             1650 ml  Net             -165 ml    LBM:   Baseline Weight: Weight: 61.7 kg (136 lb) Most recent weight: Weight: 61.2 kg (135 lb) (bed)     Palliative Assessment/Data:   Flowsheet Rows   Flowsheet Row Most Recent Value  Intake Tab  Referral Department  Hospitalist  Unit at Time of Referral  Cardiac/Telemetry Unit  Palliative Care  Primary Diagnosis  Cancer  Date Notified  08/03/16  Palliative Care Type  New Palliative care  Reason for referral  Clarify Goals of Care  Date of Admission  08/01/16  Date first seen by Palliative Care  08/04/16  # of days Palliative referral response time  1 Day(s)  # of days IP prior to Palliative referral  2  Clinical Assessment  Psychosocial & Spiritual Assessment  Palliative Care Outcomes      Time In:1215 Time Out:1330 Time Total: 75 minutes  Greater than 50%  of this time was spent counseling and coordinating care related to the above assessment and plan.  Signed by: Mariana Kaufman, AGNP-C Palliative Medicine   Please contact Palliative Medicine Team phone at [phone] for questions and concerns.  For individual provider: See Shea Evans

## 2016-08-04 NOTE — Progress Notes (Signed)
Patient ID: Kelly Neal, female   DOB: 04-Jan-1928, 82 y.o.   MRN: [id number] The patient's imaging has been reviewed with Dr. Kathlene Cote.  We would not recommend proceeding with a biopsy at this time as it is a contraindication to biopsy a liver with congestion from obstructed ducts.  It can cause a higher rate of complications.  She likely needs an ERCP for decompression of her ducts secondary to this lesion.  Many times you can also avoid a biopsy in a case like this as the diagnosis can be made with an AFP level and an MRI of her liver.  Please call if you have questions, but defer liver biopsy at this time.  Pablo Mathurin E 8:27 AM 08/04/2016

## 2016-08-05 ENCOUNTER — Telehealth: Payer: Self-pay | Admitting: Endocrinology

## 2016-08-05 LAB — GLUCOSE, CAPILLARY
GLUCOSE-CAPILLARY: 231 mg/dL — AB (ref 65–99)
GLUCOSE-CAPILLARY: 133 mg/dL — AB (ref 65–99)
Glucose-Capillary: 126 mg/dL — ABNORMAL HIGH (ref 65–99)
Glucose-Capillary: 194 mg/dL — ABNORMAL HIGH (ref 65–99)

## 2016-08-05 LAB — BASIC METABOLIC PANEL
ANION GAP: 10 (ref 5–15)
BUN: 21 mg/dL — ABNORMAL HIGH (ref 6–20)
CALCIUM: 8.5 mg/dL — AB (ref 8.9–10.3)
CO2: 21 mmol/L — AB (ref 22–32)
CREATININE: 1.19 mg/dL — AB (ref 0.44–1.00)
Chloride: 102 mmol/L (ref 101–111)
GFR calc non Af Amer: 40 mL/min — ABNORMAL LOW (ref >60)
GFR, EST AFRICAN AMERICAN: 47 mL/min — AB (ref >60)
Glucose, Bld: 176 mg/dL — ABNORMAL HIGH (ref 65–99)
Potassium: 4.2 mmol/L (ref 3.5–5.1)
SODIUM: 133 mmol/L — AB (ref 135–145)

## 2016-08-05 LAB — CANCER ANTIGEN 19-9: CA 19-9: 2272 U/mL — ABNORMAL HIGH (ref 0–35)

## 2016-08-05 LAB — AFP TUMOR MARKER: AFP TUMOR MARKER: 2.7 ng/mL (ref 0.0–8.3)

## 2016-08-05 MED ORDER — SENNA 8.6 MG PO TABS
2.0000 | ORAL_TABLET | Freq: Two times a day (BID) | ORAL | Status: DC
  Administered 2016-08-06: 17.2 mg via ORAL
  Filled 2016-08-05 (×2): qty 2

## 2016-08-05 NOTE — Care Management Note (Signed)
Case Management Note  Patient Details  Name: Kelly Neal MRN: [id number] Date of Birth: May 23, 1928  Subjective/Objective:       Admitted with Dehydration, liver mass             Action/Plan: CM talked to patient about home hospice choice, patient requested that I talk to her daughter [name]; [place] [phone]) home hospice choice offered, [name]. [name] with [facility] called / referral placed.  Expected Discharge Date:   possible 08/06/2016               Expected Discharge Plan:  [place]     Discharge planning Services  CM Consult     Choice offered to:  Adult Children  HH Agency:  [facility]  Status of Service:  In process, will continue to follow  [name] [id number] 08/05/2016, 2:06 PM

## 2016-08-05 NOTE — Progress Notes (Signed)
PROGRESS NOTE    Kelly Neal  [id number] DOB: 19-Feb-1928 DOA: 08/01/2016 PCP: Gildardo Cranker, DO   Brief Narrative:Kelly Neal is a 82 y.o. female with a past medical history significant for CHF EF 40%, CKD IV, pacer, pAF on rivaroxaban, and IDDM who presents with painless jaundice and abdominal distension. CT of the abdomen and pelvis with contrast showed a new 5 cm hepatic mass, suspicious for malignancy (primary [place] versus cholangiocarcinoma), with dilated ducts; small pancreatic cysts known from right upper quadrant ultrasound in May were still present.    Assessment & Plan:   Principal Problem:   Dehydration Active Problems:   Cardiac pacemaker in situ-St Jude implanted 04/20/13   Dizzy spells   Paroxysmal atrial fibrillation (HCC)   Chronic combined systolic and diastolic CHF (congestive heart failure) (HCC)   CKD (chronic kidney disease) stage 4, GFR 15-29 ml/min (HCC)   Essential hypertension   Liver mass   Constipation   Advance care planning   Palliative care by specialist   Abdominal distention and jaundice from the liver mass; GI consulted and recommendations given.  IR consulted for liver biopsy but unable to complete. Reportedly she doesn t want any chemo or surgery if the mass is malignant.  But she would like to know what it the mass is.  Stopped xarelto for the procedure.   Chronic systolic heart failure: - appears compensated.   Acute on CKD 4 : Improving on last check.   Paroxysmal atrial fibrillation;  xarelto held for the procedure.   Hypertension - continue amlodipine  Constipation: suppositories and laxatives ordered.   Dehydration: restart IV fluids.   DVT prophylaxis: scd's Code Status: DNR Family Communication: discussed with daughter at bedside.  Disposition Plan: Pending further eval.   Consultants:   GI  IR   Procedures: liver biopsy.   Antimicrobials: none  Subjective: Pt has no new complaints.     Objective: Vitals:   08/05/16 0500 08/05/16 0915 08/05/16 1151 08/05/16 1445  BP: (!) 160/70 (!) 146/91 (!) 188/82 (!) 176/74  Pulse: 77 80 78   Resp: 18 18 18    Temp: 97.9 F (36.6 C)  97.5 F (36.4 C)   TempSrc: Oral  Oral   SpO2: 99% 98% 99%   Weight: 61.5 kg (135 lb 9.3 oz)     Height:        Intake/Output Summary (Last 24 hours) at 08/05/16 1647 Last data filed at 08/05/16 1500  Gross per 24 hour  Intake             1155 ml  Output              852 ml  Net              303 ml   Filed Weights   08/02/16 0231 08/04/16 0511 08/05/16 0500  Weight: 58.6 kg (129 lb 3.2 oz) 61.2 kg (135 lb) 61.5 kg (135 lb 9.3 oz)    Examination:  General exam: Appears cachetic looking. Jaundiced. In nad.  Respiratory system: Clear to auscultation. Respiratory effort normal. Cardiovascular system: S1 & S2 heard, RRR. No JVD, murmurs, rubs, gallops or clicks. No pedal edema. Gastrointestinal system: Abdomen is distended soft and mildly tender in the right upper quadrant.Marland Kitchen+ bowel sounds Central nervous system: Alert and oriented. No focal neurological deficits. Extremities: Symmetric 5 x 5 power. Skin: No rashes, lesions or ulcers Psychiatry: Judgement and insight appear normal. Mood & affect appropriate.    Data Reviewed: I have  personally reviewed following labs and imaging studies  CBC:  Recent Labs Lab 08/01/16 1740  WBC 10.6*  HGB 12.6  HCT 38.4  MCV 89.7  PLT 123XX123   Basic Metabolic Panel:  Recent Labs Lab 08/01/16 1740 08/02/16 0610 08/03/16 0230 08/05/16 0013  NA 134* 137 135 133*  K 3.5 3.2* 4.1 4.2  CL 99* 101 103 102  CO2 24 25 23  21*  GLUCOSE 176* 148* 78 176*  BUN 21* 19 15 21*  CREATININE 1.22* 1.14* 1.09* 1.19*  CALCIUM 9.2 8.7* 8.8* 8.5*   GFR: Estimated Creatinine Clearance: 33.6 mL/min (by C-G formula based on SCr of 1.19 mg/dL (H)). Liver Function Tests:  Recent Labs Lab 08/01/16 1740 08/02/16 0610 08/03/16 0230  AST 106* 85* 97*  ALT  203* 159* 148*  ALKPHOS 390* 355* 386*  BILITOT 6.3* 5.5* 7.4*  PROT 6.7 5.5* 6.0*  ALBUMIN 3.1* 2.6* 2.6*    Recent Labs Lab 08/01/16 1740  LIPASE 39   No results for input(s): AMMONIA in the last 168 hours. Coagulation Profile:  Recent Labs Lab 08/04/16 1018  INR 1.09   Cardiac Enzymes: No results for input(s): CKTOTAL, CKMB, CKMBINDEX, TROPONINI in the last 168 hours. BNP (last 3 results) No results for input(s): PROBNP in the last 8760 hours. HbA1C: No results for input(s): HGBA1C in the last 72 hours. CBG:  Recent Labs Lab 08/04/16 1703 08/04/16 2122 08/05/16 0640 08/05/16 1132 08/05/16 1631  GLUCAP 138* 161* 126* 231* 194*   Lipid Profile: No results for input(s): CHOL, HDL, LDLCALC, TRIG, CHOLHDL, LDLDIRECT in the last 72 hours. Thyroid Function Tests: No results for input(s): TSH, T4TOTAL, FREET4, T3FREE, THYROIDAB in the last 72 hours. Anemia Panel: No results for input(s): VITAMINB12, FOLATE, FERRITIN, TIBC, IRON, RETICCTPCT in the last 72 hours. Sepsis Labs:  Recent Labs Lab 08/01/16 2014  LATICACIDVEN 0.74    No results found for this or any previous visit (from the past 240 hour(s)).   Radiology Studies: No results found.   Scheduled Meds: . amLODipine  5 mg Oral Daily  . bisacodyl  10 mg Rectal Once  . docusate sodium  100 mg Oral BID  . insulin aspart  0-5 Units Subcutaneous QHS  . insulin aspart  0-9 Units Subcutaneous TID WC  . insulin detemir  6 Units Subcutaneous Daily  . polyethylene glycol  17 g Oral BID  . senna  2 tablet Oral BID  . ursodiol  300 mg Oral TID   Continuous Infusions: . sodium chloride 75 mL/hr at 08/05/16 0204     LOS: 2 days   Time spent: 25 minutes  Velvet Bathe, MD [facility] Pager [phone]  If 7PM-7AM, please contact night-coverage [email or web address] Password [facility] 08/05/2016, 4:47 PM

## 2016-08-05 NOTE — Progress Notes (Signed)
Notified by Olga Coaster CMRN of family request for [facility] services at home after discharge. Chart and patient information currently under review to confirm hospice eligibility.   Spoke with patient and her daughter [name]  at bedside to initiate education related to hospice philosophy, services and team approach to care. Family verbalized understanding of the information provided. Per discussion plan is for discharge to home by ambulance tomorrow after DME has been delivered.    Please send signed completed DNR form home with patient.   Patient will need prescriptions for discharge comfort medications.   DME needs discussed and family requested hospital bed , OBT, W/C, Rolling walker with seat, and BSC.  [facility] equipment manager Jewel Ysidro Evert notified and will contact [place] to arrange delivery to the home.  The home address has been verified and is correct in the chart; Carole Binning is  to be contacted to arrange time of delivery.   [facility]erral [facility] aware of the above.   Completed discharge summary will need to be faxed to [facility] at [phone] when final.   Please notify [facility] when patient is ready to leave unit at discharge-call [phone].   [facility] information and contact numbers have been given to the daughter  during visit.  Above information shared with Olga Coaster,  CMRN.  Please call with any questions.   Thank You,  Mickie Kay, [facility] Liaison  [phone]

## 2016-08-05 NOTE — Telephone Encounter (Signed)
FYI: Pt is hospitalized and will be going home tomorrow under hospice care, pt has liver cancer.

## 2016-08-05 NOTE — Progress Notes (Signed)
Reported from NA that patient BP elevated. BP 195/76. Patient c/o abdominal pain at this time, 10/10. No other complaints. Patient does requests medication to help her sleep, along with pain medication. Will give medications and then will recheck BP.

## 2016-08-06 ENCOUNTER — Telehealth: Payer: Self-pay | Admitting: *Deleted

## 2016-08-06 DIAGNOSIS — R1084 Generalized abdominal pain: Secondary | ICD-10-CM

## 2016-08-06 DIAGNOSIS — K5909 Other constipation: Secondary | ICD-10-CM

## 2016-08-06 LAB — GLUCOSE, CAPILLARY
GLUCOSE-CAPILLARY: 192 mg/dL — AB (ref 65–99)
GLUCOSE-CAPILLARY: 208 mg/dL — AB (ref 65–99)

## 2016-08-06 MED ORDER — URSODIOL 300 MG PO CAPS: 300.0000 mg | ORAL_CAPSULE | Freq: Three times a day (TID) | ORAL | 0 refills | Status: AC

## 2016-08-06 MED ORDER — SENNA 8.6 MG PO TABS: 2.0000 | ORAL_TABLET | Freq: Two times a day (BID) | ORAL | 0 refills | Status: AC

## 2016-08-06 MED ORDER — OXYCODONE HCL 5 MG PO TABS: 5.0000 mg | ORAL_TABLET | ORAL | 0 refills | Status: AC | PRN

## 2016-08-06 MED ORDER — SORBITOL 70 % SOLN
960.0000 mL | TOPICAL_OIL | Freq: Once | ORAL | Status: AC
  Administered 2016-08-06: 960 mL via RECTAL
  Filled 2016-08-06: qty 240

## 2016-08-06 MED ORDER — ALPRAZOLAM 0.25 MG PO TABS: 0.1250 mg | ORAL_TABLET | Freq: Three times a day (TID) | ORAL | 0 refills | Status: AC | PRN

## 2016-08-06 MED ORDER — OXYCODONE HCL 5 MG PO TABS
5.0000 mg | ORAL_TABLET | ORAL | Status: DC | PRN
  Administered 2016-08-06 (×2): 5 mg via ORAL
  Filled 2016-08-06 (×2): qty 1

## 2016-08-06 MED ORDER — ALPRAZOLAM 0.25 MG PO TABS
0.1250 mg | ORAL_TABLET | Freq: Three times a day (TID) | ORAL | Status: DC | PRN
  Administered 2016-08-06: 0.25 mg via ORAL
  Filled 2016-08-06 (×2): qty 1

## 2016-08-06 MED ORDER — OXYCODONE HCL ER 15 MG PO T12A
15.0000 mg | EXTENDED_RELEASE_TABLET | Freq: Two times a day (BID) | ORAL | Status: DC
  Administered 2016-08-06: 15 mg via ORAL
  Filled 2016-08-06: qty 1

## 2016-08-06 MED ORDER — MAGNESIUM CITRATE PO SOLN
1.0000 | Freq: Once | ORAL | Status: AC
  Administered 2016-08-06: 1 via ORAL
  Filled 2016-08-06: qty 296

## 2016-08-06 MED ORDER — TRAZODONE HCL 50 MG PO TABS: 50.0000 mg | ORAL_TABLET | Freq: Every evening | ORAL | Status: DC | PRN

## 2016-08-06 NOTE — Telephone Encounter (Signed)
Patient daughter, [name] called and just wanted to let you know that patient is being discharged from the Hospital home with Hospice.

## 2016-08-06 NOTE — Care Management Important Message (Signed)
Important Message  Patient Details  Name: Kelly Neal MRN: [id number] Date of Birth: October 30, 1928   Medicare Important Message Given:  Yes    [name] 08/06/2016, 9:51 AM

## 2016-08-06 NOTE — Progress Notes (Signed)
Daily Progress Note   Patient Name: Kelly Neal       Date: 08/06/2016 DOB: [birth year]  Age: 82 y.o. MRN#: [id number] Attending Physician: Caren Griffins, MD Primary Care Physician: Gildardo Cranker, DO Admit Date: 08/01/2016  Reason for Consultation/Follow-up: Non pain symptom management and Pain control  Subjective: Patient seen. Son at bedside. Patient visibly uncomfortable. Reporting increasing abdominal pain and pressure. She is constipated and cannot recall last BM. The last two nights she has not received nightime dose of Miralax or Sennakot. States she refused it because "I'd already had it".   Review of Systems  Constitutional: Positive for malaise/fatigue and weight loss.  Respiratory: Negative.   Cardiovascular: Negative.   Gastrointestinal: Positive for constipation.  Genitourinary: Negative.   Skin: Positive for itching.  Neurological: Positive for weakness and headaches.       Headache   Psychiatric/Behavioral: The patient is nervous/anxious.    Length of Stay: 3  Current Medications: Scheduled Meds:  . amLODipine  5 mg Oral Daily  . bisacodyl  10 mg Rectal Once  . docusate sodium  100 mg Oral BID  . insulin aspart  0-5 Units Subcutaneous QHS  . insulin aspart  0-9 Units Subcutaneous TID WC  . insulin detemir  6 Units Subcutaneous Daily  . magnesium citrate  1 Bottle Oral Once  . oxyCODONE  15 mg Oral Q12H  . polyethylene glycol  17 g Oral BID  . senna  2 tablet Oral BID  . sorbitol, milk of mag, mineral oil, glycerin (SMOG) enema  960 mL Rectal Once  . ursodiol  300 mg Oral TID    Continuous Infusions: . sodium chloride 75 mL/hr at 08/06/16 0345    PRN Meds: ALPRAZolam, hydrALAZINE, ondansetron **OR** ondansetron (ZOFRAN) IV, oxyCODONE,  traZODone  Physical Exam  Constitutional: She is oriented to person, place, and time.  Frail   HENT:  Head: Normocephalic and atraumatic.  Cardiovascular: Normal rate and regular rhythm.   Pulmonary/Chest: Effort normal and breath sounds normal.  Abdominal: She exhibits distension and mass. There is tenderness.  Musculoskeletal: Normal range of motion.  Generalized weakness   Neurological: She is alert and oriented to person, place, and time.  Skin: Skin is warm and dry.  Jaundice   Psychiatric:  Anxious, uncomfortable  Vital Signs: BP (!) 172/62 (BP Location: Left Arm)   Pulse 84   Temp 98 F (36.7 C) (Oral)   Resp 16   Ht 5\' 7"  (1.702 m)   Wt 64.1 kg (141 lb 6.4 oz) Comment: scale c  SpO2 98%   BMI 22.15 kg/m  SpO2: SpO2: 98 % O2 Device: O2 Device: Not Delivered O2 Flow Rate:    Intake/output summary:  Intake/Output Summary (Last 24 hours) at 08/06/16 0949 Last data filed at 08/06/16 [id number]  Gross per 24 hour  Intake          2096.25 ml  Output              750 ml  Net          1346.25 ml   LBM: Last BM Date: 08/05/16 Baseline Weight: Weight: 61.7 kg (136 lb) Most recent weight: Weight: 64.1 kg (141 lb 6.4 oz) (scale c)       Palliative Assessment/Data: PPS: 50%    Flowsheet Rows   Flowsheet Row Most Recent Value  Intake Tab  Referral Department  Hospitalist  Unit at Time of Referral  Cardiac/Telemetry Unit  Palliative Care Primary Diagnosis  Cancer  Date Notified  08/03/16  Palliative Care Type  New Palliative care  Reason for referral  Clarify Goals of Care  Date of Admission  08/01/16  Date first seen by Palliative Care  08/04/16  # of days Palliative referral response time  1 Day(s)  # of days IP prior to Palliative referral  2  Clinical Assessment  Psychosocial & Spiritual Assessment  Palliative Care Outcomes      Patient Active Problem List   Diagnosis Date Noted  . Advance care planning   . Palliative care by specialist    . Liver mass 08/02/2016  . Dehydration 08/02/2016  . Constipation 08/02/2016  . Diabetes ([place]) 03/06/2016  . Long term (current) use of anticoagulants 12/13/2015  . Essential hypertension   . CKD (chronic kidney disease) stage 4, GFR 15-29 ml/min (H[facility]) 03/09/2015  . Peripheral edema   . Proteinuria   . Chronic combined systolic and diastolic CHF (congestive heart failure) ([place]) 03/03/2015  . Paroxysmal atrial fibrillation ([place]) 09/05/2014  . Dizzy spells 05/26/2013  . Cardiac pacemaker in situ-St Jude implanted 04/20/13 04/21/2013  . Second degree atrioventricular block, Mobitz (type) I 04/19/2013  . Hyperlipidemia     Palliative Care Assessment & Plan   Patient Profile: 82 y.o. female  with past medical history of CHF (EF 40%), CKD IV, Afib (on rivoroxaban and paced), IDDM, admitted on 08/01/2016 with abdominal pain, weakness, poor po intake, abdominal distension and jaundice. Workup revealed a 5cm hepatic mass with duct dilation, elevated transaminases, elevated bilirubin. Biopsy was recommended but IR cancelled d/t liver congestion from obstructed ducts. Pt not able to have MRI d/t pacemaker. AFP and CA19-9 tests are pending. ERCP was recommended for palliation, but per GI will likely not be beneficial d/t  CBD being normal and dilatation being intrahepatic and diffuse.    Assessment/Recommendations/Plan:  -Pain- r/t hepatic mass- start Oxycontin 15mg  BID, Oxycodone IR 5mg  Q2hr for breakthrough- will need frequent dosing of Oxycodone IR for first 24 hours until Oxycontin reaches peak effect -Constipation- r/t hepatic mass, aggravated by opioids- has not been taking Sennakot and Miralax as ordered- education provided re: must take sennakot and Miralax twice daily for full effect and as prophylaxis of opioid induced constipation. Ordered Mag Citrate x 1, Dr. Renne Crigler also ordered enema. Consider Relistor  x 1 before discharge if bowels do not move today. -Anxiety- increased Xanax to  TID -Insomnia- stopped Ambien; started Trazadone QHS for insomnia and anxiety   Goals of Care and Additional Recommendations:  Limitations on Scope of Treatment: Avoid Hospitalization, Full Comfort Care, Minimize Medications, No Artificial Feeding, No Blood Transfusions, No Chemotherapy, No Diagnostics, No Surgical Procedures and No Tracheostomy  Code Status:    Code Status Orders        Start     Ordered   08/02/16 0251  Do not attempt resuscitation (DNR)  Continuous    Question Answer Comment  In the event of cardiac or respiratory ARREST Do not call a "code blue"   In the event of cardiac or respiratory ARREST Do not perform Intubation, CPR, defibrillation or ACLS   In the event of cardiac or respiratory ARREST Use medication by any route, position, wound care, and other measures to relive pain and suffering. May use oxygen, suction and manual treatment of airway obstruction as needed for comfort.      08/02/16 0250    Code Status History    Date Active Date Inactive Code Status Order ID Comments User Context   03/12/2015  9:19 AM 08/01/2016  5:12 PM DNR [id number]  Hennie Duos, MD Outpatient   03/03/2015  6:18 PM 03/07/2015  5:10 PM Full Code [id number]  Patrecia Pour, MD Inpatient    Advance Directive Documentation   Flowsheet Row Most Recent Value  Type of Advance Directive  Living will  Pre-existing out of facility DNR order (yellow form or pink MOST form)  No data  "MOST" Form in Place?  No data       Prognosis:   < 4 weeks d/t progressing hepatic mass, no plans to pursue aggressive treatment  Discharge Planning:  Home with Hospice  Care plan was discussed with patient; son- Elberta Fortis;  patient's RN, Janett Billow; Asst Director, Corliss Blacker.   Thank you for allowing the Palliative Medicine Team to assist in the care of this patient.   Time In: 0900 Time Out: 0930 Total Time 30 mins Prolonged Time Billed No      Greater than 50%  of this time was spent counseling and  coordinating care related to the above assessment and plan.  Payton Emerald, NP  Please contact Palliative Medicine Team phone at [phone] for questions and concerns.

## 2016-08-06 NOTE — Telephone Encounter (Signed)
[name] with Hospice called and wanted to know if you will be the Attending. Please Advise.  Dewaine Oats (Hospice)#: [phone]

## 2016-08-06 NOTE — Progress Notes (Signed)
Pt has orders to be discharged. Discharge instructions given and pt has no additional questions at this time. Medication regimen reviewed and pt educated. Pt verbalized understanding and has no additional questions. Telemetry box removed. IV removed and site in good condition. Pt stable and waiting for transportation.  Lavada Langsam RN 

## 2016-08-06 NOTE — Telephone Encounter (Signed)
To be advised.  

## 2016-08-06 NOTE — Discharge Summary (Signed)
Physician Discharge Summary  [name] [id number] DOB: Jul 27, 1928 DOA: 08/01/2016  PCP: Gildardo Cranker, DO  Admit date: 08/01/2016 Discharge date: 08/06/2016  Admitted From: home Disposition:  Home with hospice  Recommendations for Outpatient Follow-up:  Follow up with hospice services  Home Health: hospice Equipment/Devices: none  Discharge Condition: guarded, with hospice CODE STATUS: DNR Diet recommendation: as tolerated   HPI: Kelly Neal is a 82 y.o. female with a past medical history significant for CHF EF 40%, CKD IV, pacer, pAF on rivaroxaban, and IDDM who presents with painless jaundice and abdominal distension. The patient presents with 1-2 weeks decreased appetite, nausea, constipation, abdominal distention, vague abdominal discomfort, and new jaundice.  She has tried OTC suppository without relief of constipation.  This progressed until now for several days she has had barely anything to eat, and is afraid to fall she is so weak and dizzy, her daughter brought her to the ER.  Hospital Course: Discharge Diagnoses:  Principal Problem:   Dehydration Active Problems:   Cardiac pacemaker in situ-St Jude implanted 04/20/13   Dizzy spells   Paroxysmal atrial fibrillation (HCC)   Chronic combined systolic and diastolic CHF (congestive heart failure) (HCC)   CKD (chronic kidney disease) stage 4, GFR 15-29 ml/min (HCC)   Essential hypertension   Liver mass   Constipation   Advance care planning   Palliative care by specialist   Generalized abdominal pain  Abdominal distention and jaundice from the liver mass - GI and IR consulted, radiology deferred biopsy because of increased risk. Workup was pertinent for normal AFP however CA 19-9 was elevated suggesting cholangiocarcinoma as most likely diagnosis. Patient repeated that she does not want aggressive care, chemotherapy and palliative was consulted, and she will be discharged home with hospice services follow  up and will focus her care on comfort Chronic systolic heart failure - appears compensated.  Acute on CKD 4 - Cr stable Paroxysmal atrial fibrillation - xarelto Hypertension - continue amlodipine Constipation - improved with enema   Discharge Instructions    Medication List    TAKE these medications   ALPRAZolam 0.25 MG tablet Commonly known as:  XANAX Take 0.5-1 tablets (0.125-0.25 mg total) by mouth 3 (three) times daily as needed for anxiety or sleep. What changed:  how much to take  how to take this  when to take this  reasons to take this  additional instructions   amLODipine 5 MG tablet Commonly known as:  NORVASC Take 1 tablet (5 mg total) by mouth daily. Please schedule appointment for refills.   B-D ULTRAFINE III SHORT PEN 31G X 8 MM Misc Generic drug:  Insulin Pen Needle Use to inject insulin 1 time per day   BAYER CONTOUR NEXT MONITOR w/Device Kit Use to check blood sugar 2 times per day   furosemide 20 MG tablet Commonly known as:  LASIX Take 1 tablet (20 mg total) by mouth daily.   glucose blood test strip Commonly known as:  BAYER CONTOUR NEXT TEST 1 each by Other route 2 (two) times daily. And lancets 2/day. Dx: E11.22   Insulin Detemir 100 UNIT/ML Pen Commonly known as:  LEVEMIR FLEXTOUCH Inject 12 Units into the skin every morning. And pen needles 1/day   oxyCODONE 5 MG immediate release tablet Commonly known as:  Oxy IR/ROXICODONE Take 1 tablet (5 mg total) by mouth every 2 (two) hours as needed for moderate pain.   Rivaroxaban 15 MG Tabs tablet Commonly known as:  XARELTO Take 1  tablet (15 mg total) by mouth daily.   senna 8.6 MG Tabs tablet Commonly known as:  SENOKOT Take 2 tablets (17.2 mg total) by mouth 2 (two) times daily.   ursodiol 300 MG capsule Commonly known as:  ACTIGALL Take 1 capsule (300 mg total) by mouth 3 (three) times daily.      Follow-up Information    Hospice at [facility] .   Specialty:  Hospice and  Palliative Medicine Contact information: [place] [id number] [phone]          Allergies  Allergen Reactions  . Tylenol [Acetaminophen] Other (See Comments)    MD thinks its causing internal bleeding     Consultations:  GI  IR  Palliative  Procedures/Studies:  None   Ct Abdomen Pelvis W Contrast  Result Date: 08/01/2016 CLINICAL DATA:  Acute onset of generalized abdominal pain, elevated LFTs and increased urinary frequency. EXAM: CT ABDOMEN AND PELVIS WITH CONTRAST TECHNIQUE: Multidetector CT imaging of the abdomen and pelvis was performed using the standard protocol following bolus administration of intravenous contrast. CONTRAST:  106m ISOVUE-300 IOPAMIDOL (ISOVUE-300) INJECTION 61% COMPARISON:  Abdominal ultrasound performed 03/19/2016 FINDINGS: Lower chest: Minimal bibasilar atelectasis is noted. Pacemaker leads are partially imaged. Hepatobiliary: There is a prominent hypoattenuating mass at the central right hepatic lobe, measuring approximately 5.3 x 3.8 x 4.6 cm. There is some degree of displacement of surrounding structures, with diffuse dilatation of intrahepatic biliary ducts. Additional vague hypoattenuation is noted within the central right hepatic lobe. This is concerning for malignancy; a hepatic abscess is considered much less likely, as no well defined fluid is seen. The common bile duct remains normal in caliber. Pancreas: A 9 mm cystic focus is noted at the pancreatic tail. The pancreas is otherwise unremarkable. Spleen: The spleen is unremarkable in appearance. Adrenals/Urinary Tract: The adrenal glands are unremarkable in appearance. The kidneys are within normal limits. There is no evidence of hydronephrosis. No renal or ureteral stones are identified. Mild nonspecific perinephric stranding is noted bilaterally. Stomach/Bowel: The appendix is not definitely characterized; there is no evidence of appendicitis. Minimal diverticulosis is  noted along the ascending and sigmoid colon, without evidence of diverticulitis. A small umbilical hernia is noted, containing only fat. The stomach is decompressed and grossly unremarkable. The small bowel is within normal limits. Vascular/Lymphatic: Scattered calcification is seen along the abdominal aorta and its branches. The abdominal aorta is otherwise grossly unremarkable. The inferior vena cava is grossly unremarkable. No retroperitoneal lymphadenopathy is seen. No pelvic sidewall lymphadenopathy is identified. Reproductive: The bladder is moderately distended and within normal limits. The uterus is grossly unremarkable in appearance. The ovaries are relatively symmetric. No suspicious adnexal masses are seen. Other: No additional soft tissue abnormalities are seen. Musculoskeletal: No acute osseous abnormalities are identified. The visualized musculature is unremarkable in appearance. There is mild chronic loss of height at vertebral body L2. IMPRESSION: 1. Prominent hypoattenuating mass at the central right hepatic lobe, measuring 5.3 x 3.8 x 4.6 cm. Some degree of displacement of surrounding structures, with diffuse dilatation of intrahepatic biliary ducts. Additional vague hypoattenuation noted within the central right hepatic lobe. This is concerning for primary hepatic malignancy or cholangiocarcinoma; a hepatic abscess is considered much less likely, as no well defined fluid is seen. 2. **An incidental finding of potential clinical significance has been found. 9 mm cystic focus at the pancreatic tail. As before, follow-up MRCP would be helpful in 8 months to assess stability.** 3. Minimal diverticulosis along the ascending and  sigmoid colon, without evidence of diverticulitis. 4. Small umbilical hernia, containing only fat. 5. Mild chronic loss of height at vertebral body L2. Electronically Signed   By: Garald Balding M.D.   On: 08/01/2016 23:30      Subjective: - no chest pain, shortness of  breath, no nausea or vomiting.  - has abdominal discomfort and complains of constipation  Discharge Exam: Vitals:   08/06/16 0529 08/06/16 0648  BP: (!) 187/75 (!) 172/62  Pulse: 84   Resp: 16   Temp: 98 F (36.7 C)    Vitals:   08/05/16 2104 08/05/16 2256 08/06/16 0529 08/06/16 0648  BP: (!) 195/76 (!) 173/70 (!) 187/75 (!) 172/62  Pulse: 85  84   Resp: 18  16   Temp: 98 F (36.7 C)  98 F (36.7 C)   TempSrc: Oral  Oral   SpO2: 99%  98%   Weight:   64.1 kg (141 lb 6.4 oz)   Height:        General: Pt is alert, awake, not in acute distress, jaundiced Cardiovascular: RRR, S1/S2 +, no rubs, no gallops Respiratory: CTA bilaterally, no wheezing, no rhonchi   The results of significant diagnostics from this hospitalization (including imaging, microbiology, ancillary and laboratory) are listed below for reference.     Microbiology: No results found for this or any previous visit (from the past 240 hour(s)).   Labs: BNP (last 3 results) No results for input(s): BNP in the last 8760 hours. Basic Metabolic Panel:  Recent Labs Lab 08/01/16 1740 08/02/16 0610 08/03/16 0230 08/05/16 0013  NA 134* 137 135 133*  K 3.5 3.2* 4.1 4.2  CL 99* 101 103 102  CO2 24 25 23  21*  GLUCOSE 176* 148* 78 176*  BUN 21* 19 15 21*  CREATININE 1.22* 1.14* 1.09* 1.19*  CALCIUM 9.2 8.7* 8.8* 8.5*   Liver Function Tests:  Recent Labs Lab 08/01/16 1740 08/02/16 0610 08/03/16 0230  AST 106* 85* 97*  ALT 203* 159* 148*  ALKPHOS 390* 355* 386*  BILITOT 6.3* 5.5* 7.4*  PROT 6.7 5.5* 6.0*  ALBUMIN 3.1* 2.6* 2.6*    Recent Labs Lab 08/01/16 1740  LIPASE 39   No results for input(s): AMMONIA in the last 168 hours. CBC:  Recent Labs Lab 08/01/16 1740  WBC 10.6*  HGB 12.6  HCT 38.4  MCV 89.7  PLT 316   Cardiac Enzymes: No results for input(s): CKTOTAL, CKMB, CKMBINDEX, TROPONINI in the last 168 hours. BNP: Invalid input(s): POCBNP CBG:  Recent Labs Lab 08/05/16 1132  08/05/16 1631 08/05/16 2104 08/06/16 0534 08/06/16 1131  GLUCAP 231* 194* 133* 192* 208*   D-Dimer No results for input(s): DDIMER in the last 72 hours. Hgb A1c No results for input(s): HGBA1C in the last 72 hours. Lipid Profile No results for input(s): CHOL, HDL, LDLCALC, TRIG, CHOLHDL, LDLDIRECT in the last 72 hours. Thyroid function studies No results for input(s): TSH, T4TOTAL, T3FREE, THYROIDAB in the last 72 hours.  Invalid input(s): FREET3 Anemia work up No results for input(s): VITAMINB12, FOLATE, FERRITIN, TIBC, IRON, RETICCTPCT in the last 72 hours. Urinalysis    Component Value Date/Time   COLORURINE AMBER (A) 08/01/2016 2030   APPEARANCEUR CLOUDY (A) 08/01/2016 2030   LABSPEC 1.011 08/01/2016 2030   PHURINE 5.5 08/01/2016 2030   GLUCOSEU NEGATIVE 08/01/2016 2030   HGBUR SMALL (A) 08/01/2016 2030   BILIRUBINUR MODERATE (A) 08/01/2016 2030   BILIRUBINUR neg 07/22/2013 [place] 08/01/2016 2030  PROTEINUR 100 (A) 08/01/2016 2030   UROBILINOGEN 0.2 03/06/2015 1429   NITRITE NEGATIVE 08/01/2016 2030   LEUKOCYTESUR TRACE (A) 08/01/2016 2030   Sepsis Labs Invalid input(s): PROCALCITONIN,  WBC,  LACTICIDVEN Microbiology No results found for this or any previous visit (from the past 240 hour(s)).   Time coordinating discharge: Over 30 minutes  SIGNED:  Marzetta Board, MD  [facility] 08/06/2016, 3:25 PM Pager [phone]  If 7PM-7AM, please contact night-coverage [email or web address] Password [email or web address]

## 2016-08-06 NOTE — Telephone Encounter (Signed)
Yes I will be the Attending physician

## 2016-08-07 ENCOUNTER — Telehealth: Payer: Self-pay | Admitting: Internal Medicine

## 2016-08-07 NOTE — Telephone Encounter (Signed)
Your patient was recently admitted to [facility] - Patient was discharged 08/06/16 under [facility].

## 2016-08-07 NOTE — Telephone Encounter (Signed)
[name] with Hospice Notified 

## 2016-08-08 ENCOUNTER — Telehealth: Payer: Self-pay | Admitting: Cardiology

## 2016-08-08 MED ORDER — FUROSEMIDE 20 MG PO TABS: 20.0000 mg | ORAL_TABLET | ORAL | 1 refills | Status: AC | PRN

## 2016-08-08 NOTE — Telephone Encounter (Signed)
Spoke w/ pt daughter and informed of MD recommendations. Pt daughter verbalized understanding.

## 2016-08-08 NOTE — Telephone Encounter (Signed)
Pt daughter called and stated that pt is on hospice. She wanted to know if MD could discontinue any medicine that pt may not need at this time?

## 2016-08-08 NOTE — Telephone Encounter (Signed)
I would recommend discontinuation of Xarelto and amlodipine. Furosemide can be continued as needed for relief of swelling and shortness of breath

## 2016-08-17 DEATH — deceased

## 2016-11-24 IMAGING — US US RENAL
1 series · 14 of 25 positions shown · non-contrast
Comparison: None.

CLINICAL DATA: Proteinuria, hypertension.

EXAM:
RENAL/URINARY TRACT ULTRASOUND COMPLETE

[Series 1: us renal · 0.21mm/px · 14 of 27 slices shown]
[im 1/27]
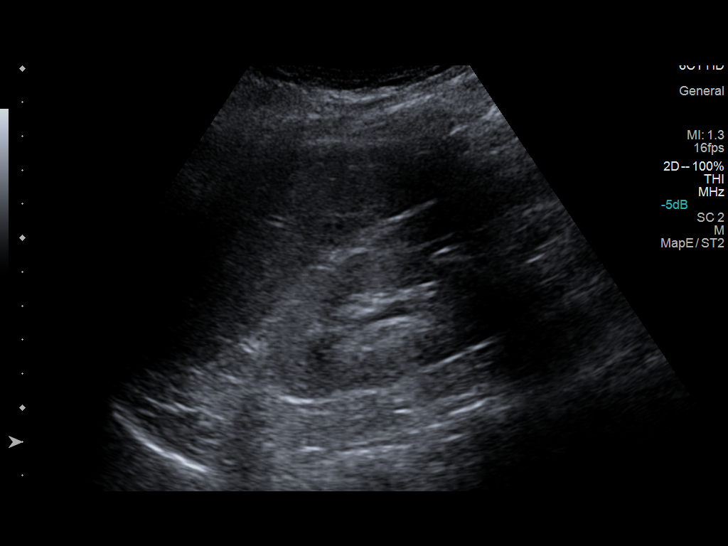
[im 3/27]
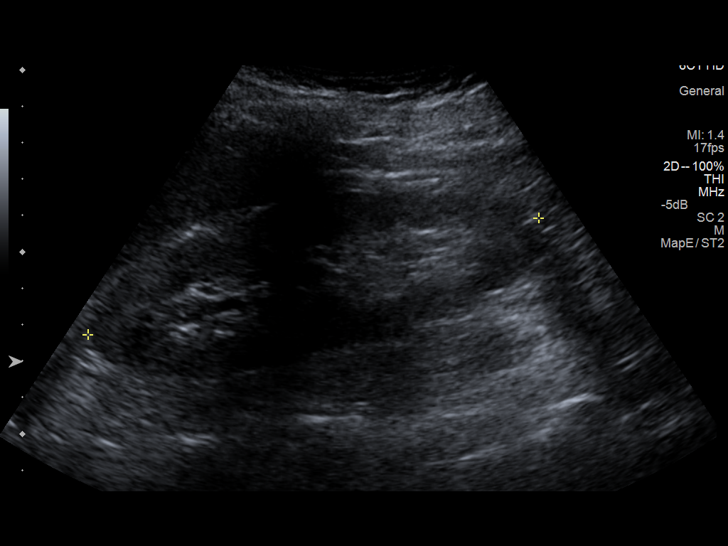
[im 5/27]
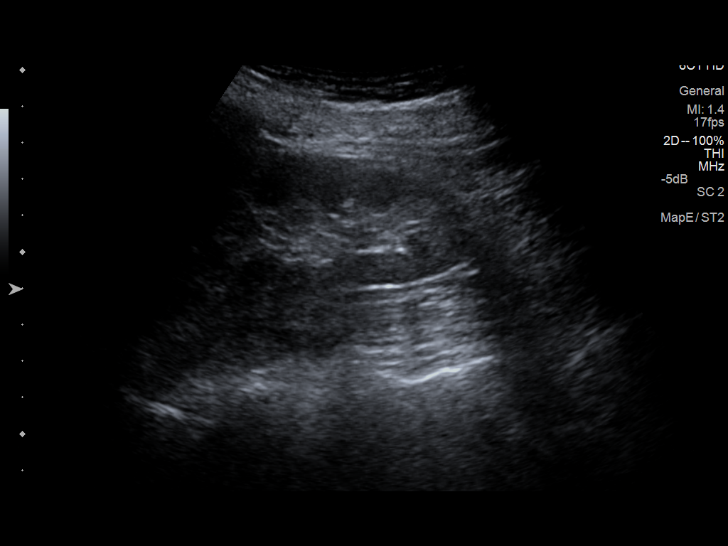
[im 7/27]
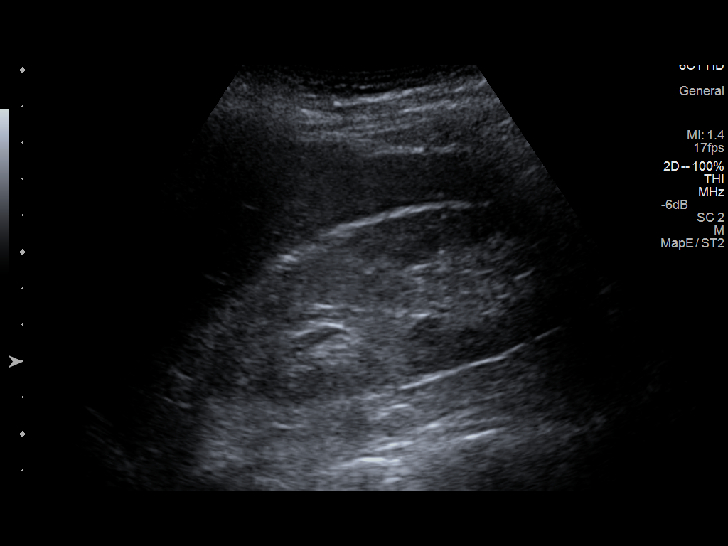
[im 9/27]
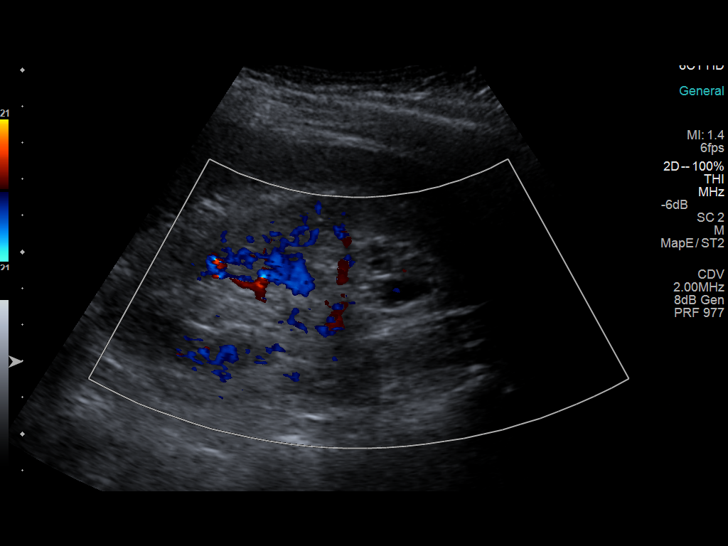
[im 10/27]
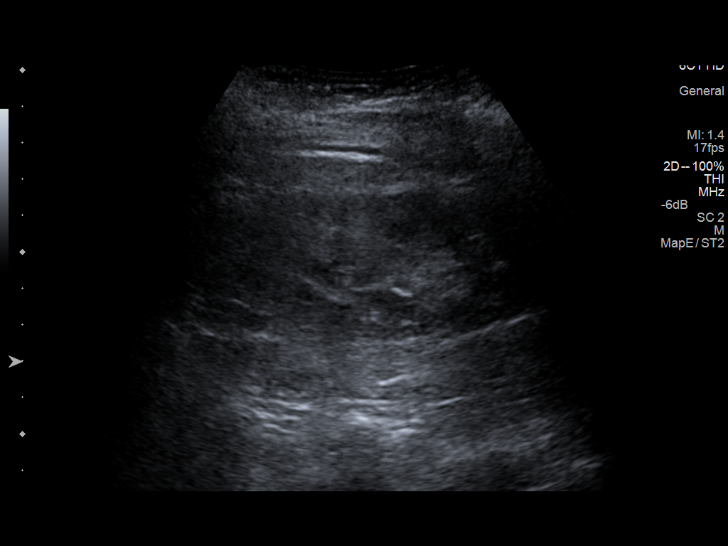
[im 12/27]
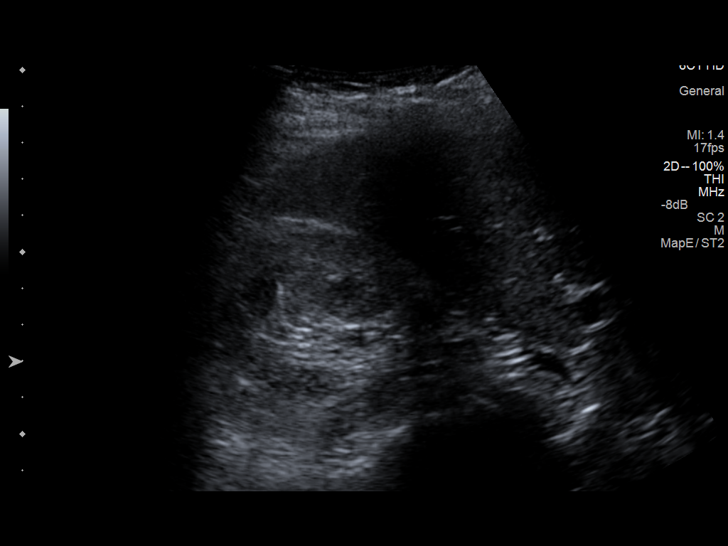
[im 15/27]
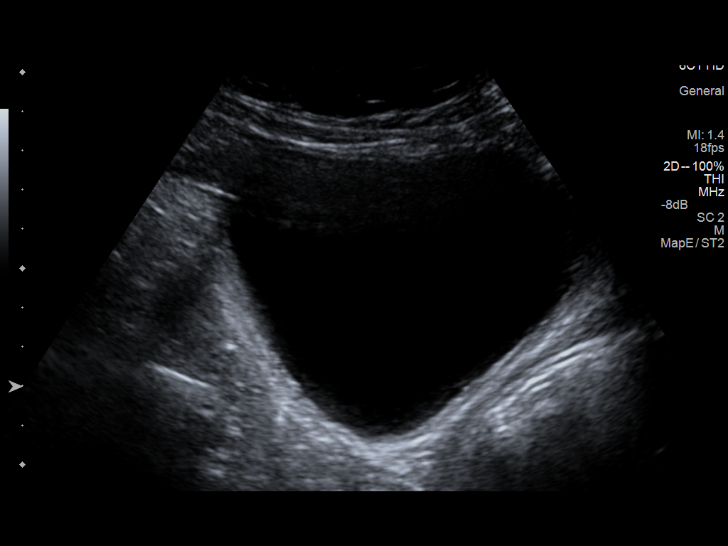
[im 17/27]
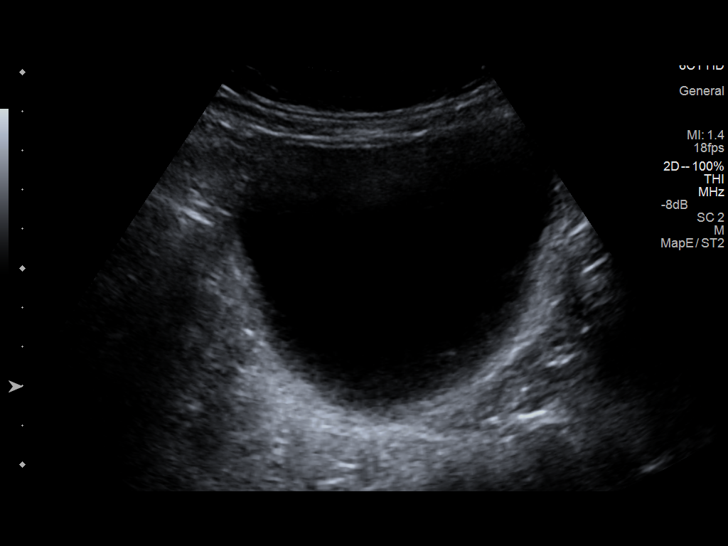
[im 18/27]
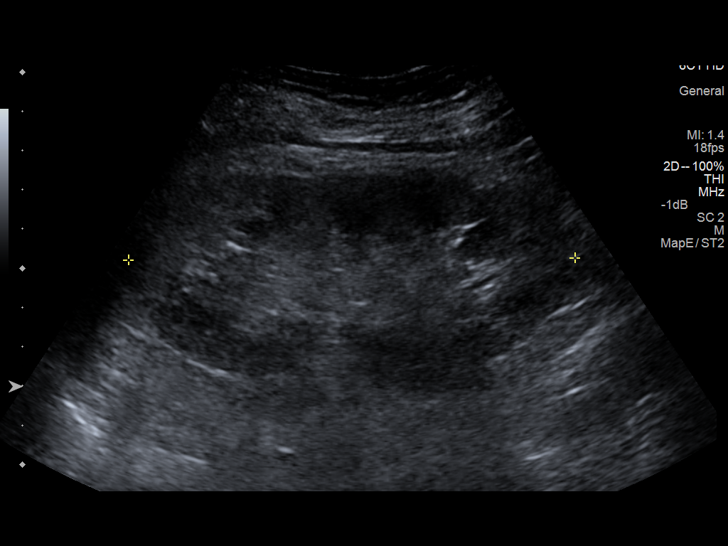
[im 20/27]
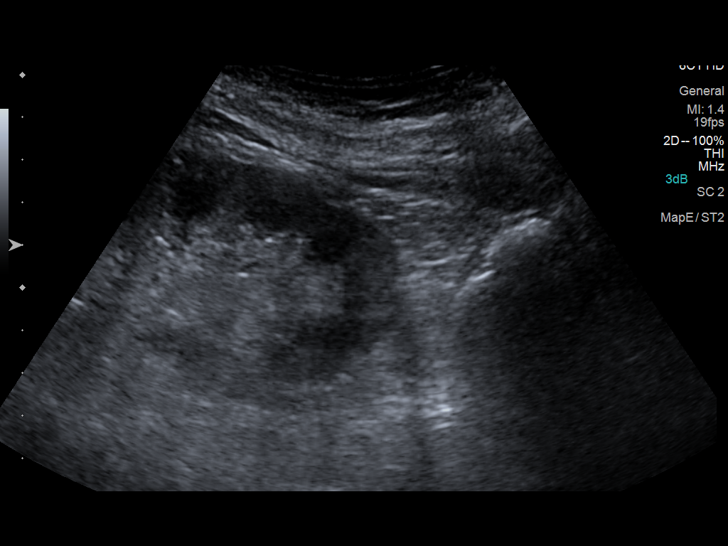
[im 22/27]
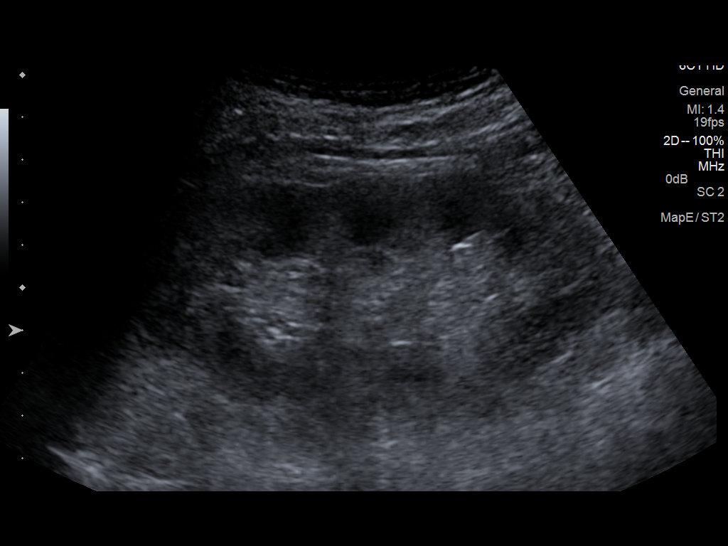
[im 24/27]
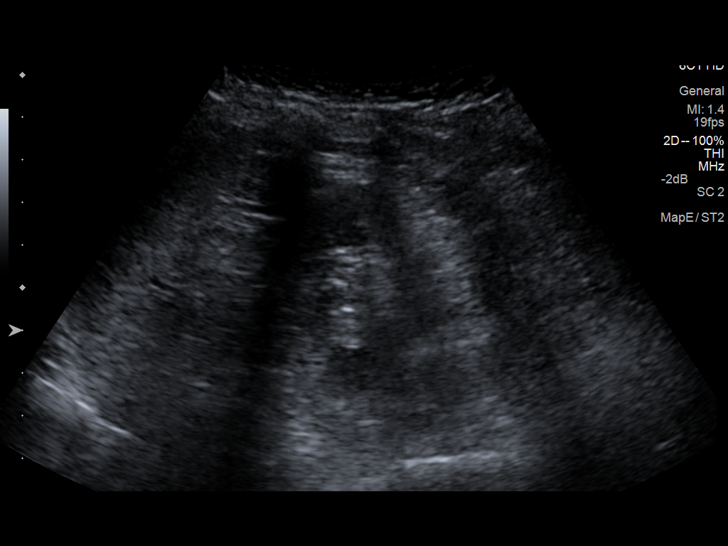
[im 27/27]
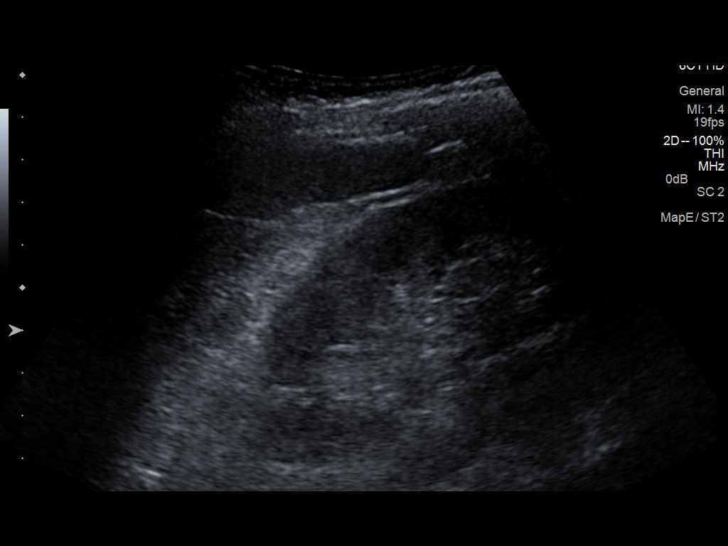

[14 of 25 positions shown; findings below may reference images not displayed]

FINDINGS: Right Kidney:

Length: 12.9 cm. Echogenicity within normal limits. No mass or
hydronephrosis visualized.

Left Kidney:

Length: 11.4 cm. Echogenicity within normal limits. No mass or
hydronephrosis visualized.

Bladder:

Appears normal for degree of bladder distention.
IMPRESSION: Normal renal ultrasound.
